# Patient Record
Sex: Female | Born: 1950 | Hispanic: No | Marital: Single | Smoking: Never smoker
Health system: Southern US, Community
[De-identification: ages and names within clinical notes are randomized; demographics above are authoritative.]

## PROBLEM LIST (undated history)

## (undated) DIAGNOSIS — T4145XA Adverse effect of unspecified anesthetic, initial encounter: Secondary | ICD-10-CM

## (undated) DIAGNOSIS — K219 Gastro-esophageal reflux disease without esophagitis: Secondary | ICD-10-CM

## (undated) DIAGNOSIS — E559 Vitamin D deficiency, unspecified: Secondary | ICD-10-CM

## (undated) DIAGNOSIS — T8859XA Other complications of anesthesia, initial encounter: Secondary | ICD-10-CM

## (undated) DIAGNOSIS — D131 Benign neoplasm of stomach: Secondary | ICD-10-CM

## (undated) DIAGNOSIS — M9080 Osteopathy in diseases classified elsewhere, unspecified site: Secondary | ICD-10-CM

## (undated) DIAGNOSIS — E889 Metabolic disorder, unspecified: Secondary | ICD-10-CM

## (undated) DIAGNOSIS — D509 Iron deficiency anemia, unspecified: Secondary | ICD-10-CM

## (undated) DIAGNOSIS — D126 Benign neoplasm of colon, unspecified: Secondary | ICD-10-CM

## (undated) DIAGNOSIS — D500 Iron deficiency anemia secondary to blood loss (chronic): Secondary | ICD-10-CM

## (undated) DIAGNOSIS — K257 Chronic gastric ulcer without hemorrhage or perforation: Secondary | ICD-10-CM

## (undated) DIAGNOSIS — S82122A Displaced fracture of lateral condyle of left tibia, initial encounter for closed fracture: Secondary | ICD-10-CM

## (undated) DIAGNOSIS — K449 Diaphragmatic hernia without obstruction or gangrene: Secondary | ICD-10-CM

## (undated) HISTORY — PX: WISDOM TOOTH EXTRACTION: SHX21

## (undated) HISTORY — DX: Diaphragmatic hernia without obstruction or gangrene: K44.9

## (undated) HISTORY — PX: COLONOSCOPY: SHX174

## (undated) HISTORY — DX: Displaced fracture of lateral condyle of left tibia, initial encounter for closed fracture: S82.122A

## (undated) HISTORY — DX: Benign neoplasm of colon, unspecified: D12.6

## (undated) HISTORY — DX: Iron deficiency anemia secondary to blood loss (chronic): D50.0

## (undated) HISTORY — DX: Benign neoplasm of stomach: D13.1

## (undated) HISTORY — DX: Chronic gastric ulcer without hemorrhage or perforation: K25.7

## (undated) HISTORY — DX: Iron deficiency anemia, unspecified: D50.9

---

## 1978-04-19 HISTORY — PX: DILATION AND CURETTAGE OF UTERUS: SHX78

## 1998-01-08 ENCOUNTER — Other Ambulatory Visit: Admission: RE | Admit: 1998-01-08 | Discharge: 1998-01-08 | Payer: Self-pay | Admitting: Obstetrics and Gynecology

## 1998-02-07 ENCOUNTER — Other Ambulatory Visit: Admission: RE | Admit: 1998-02-07 | Discharge: 1998-02-07 | Payer: Self-pay | Admitting: Obstetrics and Gynecology

## 1999-01-12 ENCOUNTER — Other Ambulatory Visit: Admission: RE | Admit: 1999-01-12 | Discharge: 1999-01-12 | Payer: Self-pay | Admitting: Obstetrics and Gynecology

## 2000-02-08 ENCOUNTER — Other Ambulatory Visit: Admission: RE | Admit: 2000-02-08 | Discharge: 2000-02-08 | Payer: Self-pay | Admitting: Obstetrics and Gynecology

## 2002-01-15 ENCOUNTER — Encounter: Payer: Self-pay | Admitting: Emergency Medicine

## 2002-01-15 ENCOUNTER — Emergency Department (HOSPITAL_COMMUNITY): Admission: EM | Admit: 2002-01-15 | Discharge: 2002-01-15 | Payer: Self-pay | Admitting: Emergency Medicine

## 2009-05-29 ENCOUNTER — Encounter (INDEPENDENT_AMBULATORY_CARE_PROVIDER_SITE_OTHER): Payer: Self-pay | Admitting: *Deleted

## 2009-10-29 ENCOUNTER — Encounter: Admission: RE | Admit: 2009-10-29 | Discharge: 2009-10-29 | Payer: Self-pay | Admitting: Internal Medicine

## 2010-05-19 NOTE — Letter (Signed)
Summary: New Patient letter  [facility]  [address] [place], [place] [address]   Phone: [phone]  Fax: [phone]       05/29/2009 MRN: [id number]  Angel Irwin [address] [place], [place]  [address]  Dear Ms. Irwin,  Welcome to the Gastroenterology Division at [facility].    You are scheduled to see Dr. Sheryn Bison on June 19, 2009 at 8:30am on the 3rd floor at [facility], [address]  We ask that you try to arrive at our office 15 minutes prior to your appointment time to allow for check-in.  We would like you to complete the enclosed self-administered evaluation form prior to your visit and bring it with you on the day of your appointment.  We will review it with you.  Also, please bring a complete list of all your medications or, if you prefer, bring the medication bottles and we will list them.  Please bring your insurance card so that we may make a copy of it.  If your insurance requires a referral to see a specialist, please bring your referral form from your primary care physician.  Co-payments are due at the time of your visit and may be paid by cash, check or credit card.     Your office visit will consist of a consult with your physician (includes a physical exam), any laboratory testing he/she may order, scheduling of any necessary diagnostic testing (e.g. x-ray, ultrasound, CT-scan), and scheduling of a procedure (e.g. Endoscopy, Colonoscopy) if required.  Please allow enough time on your schedule to allow for any/all of these possibilities.    If you cannot keep your appointment, please call [phone] to cancel or reschedule prior to your appointment date.  This allows [place] the opportunity to schedule an appointment for another patient in need of care.  If you do not cancel or reschedule by 5 p.m. the business day prior to your appointment date, you will be charged a $50.00 late cancellation/no-show fee.      Thank you for choosing [facility] for your medical needs.  We appreciate the opportunity to care for you.  Please visit [place] at our website  to learn more about our practice.                     Sincerely,                                                             The Gastroenterology Division

## 2011-01-26 ENCOUNTER — Other Ambulatory Visit: Payer: Self-pay | Admitting: Obstetrics and Gynecology

## 2011-02-12 NOTE — Patient Instructions (Addendum)
   Your procedure is scheduled on: Monday November 5th  Enter through the [employer] [facility] at: 8am Pick up the phone at the desk and dial [phone] and inform us of your arrival.  Please call this number if you have any problems the morning of surgery: [phone]  Remember: Do not eat food after midnight:Sunday Do not drink clear liquids after:midnight Sunday Take these medicines the morning of surgery with a SIP OF WATER:none  Do not wear jewelry, make-up, or FINGER nail polish Do not wear lotions, powders, or perfumes.  You may not wear deodorant. Do not shave 48 hours prior to surgery. Do not bring valuables to the hospital.  Leave suitcase in the car. After Surgery it may be brought to your room. For patients being admitted to the hospital, checkout time is 11:00am the day of discharge.   Remember to use your hibiclens as instructed.Please shower with 1/2 bottle the evening before your surgery and the other 1/2 bottle the morning of surgery.

## 2011-02-16 ENCOUNTER — Other Ambulatory Visit: Payer: Self-pay

## 2011-02-16 ENCOUNTER — Encounter (HOSPITAL_COMMUNITY)
Admission: RE | Admit: 2011-02-16 | Discharge: 2011-02-16 | Disposition: A | Discharge status: Home / Self Care | Payer: BC Managed Care – PPO | Source: Ambulatory Visit | Attending: Obstetrics and Gynecology | Admitting: Obstetrics and Gynecology

## 2011-02-16 ENCOUNTER — Encounter (HOSPITAL_COMMUNITY): Payer: Self-pay

## 2011-02-16 HISTORY — DX: Gastro-esophageal reflux disease without esophagitis: K21.9

## 2011-02-16 HISTORY — DX: Other complications of anesthesia, initial encounter: T88.59XA

## 2011-02-16 HISTORY — DX: Adverse effect of unspecified anesthetic, initial encounter: T41.45XA

## 2011-02-16 LAB — SURGICAL PCR SCREEN
MRSA, PCR: NEGATIVE
Staphylococcus aureus: NEGATIVE

## 2011-02-16 LAB — CBC
MCH: 29.6 pg (ref 26.0–34.0)
MCHC: 32.4 g/dL (ref 30.0–36.0)
Platelets: 258 10*3/uL (ref 150–400)

## 2011-02-16 NOTE — H&P (Addendum)
NAMEVIVION, Angel Irwin NO.:  [id number]  MEDICAL RECORD NO.:  [id number]  LOCATION:  PERIO                         FACILITY:  [facility]  PHYSICIAN:  Osborn Coho, M.D.   DATE OF BIRTH:  02-09-1951  DATE OF ADMISSION:  01/25/2011 DATE OF DISCHARGE:                             HISTORY & PHYSICAL   HISTORY OF PRESENT ILLNESS:  Angel Irwin is a 60 year old married white female, para 4-0-1-4, presenting for a vaginal hysterectomy with anterior-posterior colporrhaphy because of symptomatic uterine prolapse, cystocele, and rectocele.  The patient states that for the past 5 years, she has known that her uterus had "dropped," however, had very little symptoms from that occurrence.  Over the past year, however, the patient has lost approximately 30 pounds, and at that time, began to feel significant pelvic pressure, the sensation of sitting on a ball when she is seated and vaginal irritation.  She denies any changes in her bowel habits, back pain, or urinary incontinence.  Pelvic ultrasound in October 2012 showed a uterus measuring 8.55 cm x 6.57 cm x 5.30 cm with 4 measurable uterine fibroids: an anterior intramural measuring 2.11 x 2.15 x 1.49 cm, right anterior intramural measuring 0.85 x 2.02 x 1.47 cm, a posterior subserosal measuring 2.58 x 2.53 x 1.89 cm, and a right lateral subserosal fibroid measuring 4.02 x 4.09 x 3.70 cm.  Both of the patient's ovaries appeared normal on that study.  A review of both surgical and medical management options (pessary) were given to the patient, however, she has decided to proceed with definitive therapy in the form of hysterectomy with anterior-posterior colporrhaphy.  PAST MEDICAL HISTORY:  OB History:  Gravida 5, para 4-0-1-4.  The patient had a spontaneous vaginal birth in 99, 38, 53, and 83. Her largest baby weighed 9 pounds 10 ounces.  GYN History:  Menarche 60 year old.  She is menopausal.  She denies any history of  sexually transmitted diseases or abnormal Pap smears.  Her last normal Pap smear was September 2012.  Medical History:  Asthma, anemia, eczema, and bleeding stomach lesions.  SURGICAL HISTORY:  In 5 D and C.  She denies any history of blood transfusions and also refuses blood products due to being a Jehovah's Witness.  Denies any problems with anesthesia.  FAMILY HISTORY:  Cardiovascular disease, lung cancer, and diabetes mellitus.  SOCIAL HISTORY:  The patient works in Clinical biochemist at [employer]. Habits:  She does not use tobacco or illicit drugs and rarely consumes alcohol.  CURRENT MEDICATIONS:  None.  She has no known drug allergies.  The patient does have a sensitivity to Latex and fragrances,  but denies any sensitivities to peanuts, shellfish, or soy.    The patient wears glasses and contact lenses.  Denies any chest pain, shortness of breath, headache, vision changes, difficulty swallowing, nasal congestion, nausea, vomiting, diarrhea, joint swelling, myalgias, arthralgias, dysuria, hematuria, urinary frequency or urgency, hematochezia or bright red blood per rectum.  Except as is mentioned in the patient's history of present illness, the patient's review of systems is otherwise negative.  PHYSICAL EXAMINATION:  VITAL SIGNS:  Blood pressure 112/70, pulse is 70, respirations 14, temperature 98.1 degrees Fahrenheit orally, weight is 161 pounds,  height 5 feet, 1 inch tall. NECK:  Supple without masses.  There is no thyromegaly or cervical adenopathy. HEART:  Regular rate and rhythm. LUNGS:  Clear. BACK:  No CVA tenderness. ABDOMEN:  No tenderness, guarding, rebound, or organomegaly.  There are no palpable masses.  EXTREMITIES:  No clubbing, cyanosis, or edema. PELVIC:  EGBUS is atrophic.  Vagina is also atrophic with a 4/4 cystocele and rectocele.  Cervix is nontender without lesions.  Uterus is normal size, shape, and consistency without tenderness.  However,  it is prolapsed to the vaginal opening.  Adnexa without tenderness or masses.  IMPRESSION: 1. Symptomatic uterine prolapse. 2. Cystocele 3. Rectocele.  DISPOSITION:  A discussion was held with the patient regarding indications for her procedures along with their risks, which include, but are not limited to: reaction to anesthesia, damage to adjacent organs, infection, and excessive bleeding.  The patient verbalized understanding of these risks and has consented to proceed with a total vaginal hysterectomy with anterior-posterior colporrhaphy, followed by cystoscopy at [facility] on February 22, 2011 at 9:30 a.m.     Valdis Bevill J. Lowell Guitar, P.A.-C   ______________________________ Osborn Coho, M.D.    EJP/MEDQ  D:  02/15/2011  T:  02/16/2011  Job:  [id number]  No change in H&P except patient would like ovaries and fallopian tubes removed if easy to remove. Patient also reiterates that she does not want any blood products in a life threatening situation.

## 2011-02-16 NOTE — Pre-Procedure Instructions (Signed)
Pt is Jehovah Witness-refuses blood products-consent for refusal signed

## 2011-02-21 MED ORDER — DEXTROSE 5 % IV SOLN
1.0000 g | INTRAVENOUS | Status: AC
  Administered 2011-02-22: 1 g via INTRAVENOUS
  Filled 2011-02-21: qty 1

## 2011-02-22 ENCOUNTER — Encounter (HOSPITAL_COMMUNITY)
Admission: RE | Disposition: A | Discharge status: Home / Self Care | Payer: Self-pay | Source: Ambulatory Visit | Attending: Obstetrics and Gynecology

## 2011-02-22 ENCOUNTER — Other Ambulatory Visit: Payer: Self-pay | Admitting: Obstetrics and Gynecology

## 2011-02-22 ENCOUNTER — Encounter (HOSPITAL_COMMUNITY): Payer: Self-pay | Admitting: Anesthesiology

## 2011-02-22 ENCOUNTER — Other Ambulatory Visit: Payer: Self-pay

## 2011-02-22 ENCOUNTER — Ambulatory Visit (HOSPITAL_COMMUNITY): Payer: BC Managed Care – PPO | Admitting: Anesthesiology

## 2011-02-22 ENCOUNTER — Encounter (HOSPITAL_COMMUNITY): Payer: Self-pay | Admitting: *Deleted

## 2011-02-22 ENCOUNTER — Ambulatory Visit (HOSPITAL_COMMUNITY)
Admission: RE | Admit: 2011-02-22 | Discharge: 2011-02-23 | Disposition: A | Discharge status: Home / Self Care | Payer: BC Managed Care – PPO | Source: Ambulatory Visit | Attending: Obstetrics and Gynecology | Admitting: Obstetrics and Gynecology

## 2011-02-22 ENCOUNTER — Ambulatory Visit: Admit: 2011-02-22 | Payer: Self-pay | Admitting: Obstetrics and Gynecology

## 2011-02-22 DIAGNOSIS — D251 Intramural leiomyoma of uterus: Secondary | ICD-10-CM | POA: Insufficient documentation

## 2011-02-22 DIAGNOSIS — N812 Incomplete uterovaginal prolapse: Secondary | ICD-10-CM | POA: Insufficient documentation

## 2011-02-22 DIAGNOSIS — Z01818 Encounter for other preprocedural examination: Secondary | ICD-10-CM | POA: Insufficient documentation

## 2011-02-22 DIAGNOSIS — D252 Subserosal leiomyoma of uterus: Secondary | ICD-10-CM | POA: Insufficient documentation

## 2011-02-22 DIAGNOSIS — N841 Polyp of cervix uteri: Secondary | ICD-10-CM | POA: Insufficient documentation

## 2011-02-22 DIAGNOSIS — N840 Polyp of corpus uteri: Secondary | ICD-10-CM | POA: Insufficient documentation

## 2011-02-22 DIAGNOSIS — Z01812 Encounter for preprocedural laboratory examination: Secondary | ICD-10-CM | POA: Insufficient documentation

## 2011-02-22 HISTORY — PX: VAGINAL HYSTERECTOMY: SHX2639

## 2011-02-22 HISTORY — PX: SALPINGOOPHORECTOMY: SHX82

## 2011-02-22 HISTORY — PX: CYSTOSCOPY: SHX5120

## 2011-02-22 HISTORY — PX: ANTERIOR AND POSTERIOR REPAIR: SHX5121

## 2011-02-22 LAB — PRO B NATRIURETIC PEPTIDE: Pro B Natriuretic peptide (BNP): 216.8 pg/mL — ABNORMAL HIGH (ref 0–125)

## 2011-02-22 SURGERY — HYSTERECTOMY, VAGINAL
Anesthesia: General | Site: Vagina | Wound class: Clean Contaminated

## 2011-02-22 SURGERY — HYSTERECTOMY, VAGINAL
Anesthesia: General

## 2011-02-22 MED ORDER — ONDANSETRON HCL 4 MG/2ML IJ SOLN: 4.0000 mg | Freq: Four times a day (QID) | INTRAMUSCULAR | Status: DC | PRN

## 2011-02-22 MED ORDER — OXYCODONE-ACETAMINOPHEN 5-325 MG PO TABS: 1.0000 | ORAL_TABLET | ORAL | Status: DC | PRN

## 2011-02-22 MED ORDER — ALBUTEROL SULFATE HFA 108 (90 BASE) MCG/ACT IN AERS: INHALATION_SPRAY | RESPIRATORY_TRACT | Status: DC | PRN

## 2011-02-22 MED ORDER — GLYCOPYRROLATE 0.2 MG/ML IJ SOLN
INTRAMUSCULAR | Status: DC | PRN
  Administered 2011-02-22: .2 mg via INTRAVENOUS
  Administered 2011-02-22: .4 mg via INTRAVENOUS

## 2011-02-22 MED ORDER — DEXAMETHASONE SODIUM PHOSPHATE 10 MG/ML IJ SOLN
INTRAMUSCULAR | Status: AC
  Filled 2011-02-22: qty 1

## 2011-02-22 MED ORDER — SODIUM CHLORIDE 0.9 % IJ SOLN: 9.0000 mL | INTRAMUSCULAR | Status: DC | PRN

## 2011-02-22 MED ORDER — IBUPROFEN 600 MG PO TABS
600.0000 mg | ORAL_TABLET | Freq: Four times a day (QID) | ORAL | Status: DC | PRN
  Administered 2011-02-23: 600 mg via ORAL
  Filled 2011-02-22: qty 1

## 2011-02-22 MED ORDER — EPHEDRINE SULFATE 50 MG/ML IJ SOLN
INTRAMUSCULAR | Status: AC
  Filled 2011-02-22: qty 1

## 2011-02-22 MED ORDER — FENTANYL CITRATE 0.05 MG/ML IJ SOLN
INTRAMUSCULAR | Status: AC
  Administered 2011-02-22: 25 ug via INTRAVENOUS
  Filled 2011-02-22: qty 2

## 2011-02-22 MED ORDER — HYDROMORPHONE HCL PF 1 MG/ML IJ SOLN: 0.2500 mg | INTRAMUSCULAR | Status: DC | PRN

## 2011-02-22 MED ORDER — INDIGOTINDISULFONATE SODIUM 8 MG/ML IJ SOLN
INTRAMUSCULAR | Status: AC
  Filled 2011-02-22: qty 5

## 2011-02-22 MED ORDER — NEOSTIGMINE METHYLSULFATE 1 MG/ML IJ SOLN
INTRAMUSCULAR | Status: DC | PRN
  Administered 2011-02-22: 3 mg via INTRAVENOUS

## 2011-02-22 MED ORDER — KETOROLAC TROMETHAMINE 30 MG/ML IJ SOLN
INTRAMUSCULAR | Status: AC
  Filled 2011-02-22: qty 1

## 2011-02-22 MED ORDER — ALBUTEROL SULFATE HFA 108 (90 BASE) MCG/ACT IN AERS
INHALATION_SPRAY | RESPIRATORY_TRACT | Status: DC | PRN
  Administered 2011-02-22: 2 via RESPIRATORY_TRACT

## 2011-02-22 MED ORDER — ONDANSETRON HCL 4 MG/2ML IJ SOLN
INTRAMUSCULAR | Status: AC
  Filled 2011-02-22: qty 2

## 2011-02-22 MED ORDER — MIDAZOLAM HCL 5 MG/5ML IJ SOLN
INTRAMUSCULAR | Status: DC | PRN
  Administered 2011-02-22: 1.5 mg via INTRAVENOUS
  Administered 2011-02-22: .5 mg via INTRAVENOUS

## 2011-02-22 MED ORDER — DIPHENHYDRAMINE HCL 12.5 MG/5ML PO ELIX
12.5000 mg | ORAL_SOLUTION | Freq: Four times a day (QID) | ORAL | Status: DC | PRN
  Filled 2011-02-22: qty 5

## 2011-02-22 MED ORDER — LACTATED RINGERS IV SOLN
INTRAVENOUS | Status: DC
  Administered 2011-02-22 – 2011-02-23 (×2): via INTRAVENOUS

## 2011-02-22 MED ORDER — DOCUSATE SODIUM 100 MG PO CAPS
100.0000 mg | ORAL_CAPSULE | Freq: Every day | ORAL | Status: DC
  Administered 2011-02-22 – 2011-02-23 (×2): 100 mg via ORAL
  Filled 2011-02-22 (×2): qty 1

## 2011-02-22 MED ORDER — LIDOCAINE HCL (CARDIAC) 20 MG/ML IV SOLN
INTRAVENOUS | Status: DC | PRN
  Administered 2011-02-22: 60 mg via INTRAVENOUS

## 2011-02-22 MED ORDER — ROCURONIUM BROMIDE 100 MG/10ML IV SOLN
INTRAVENOUS | Status: DC | PRN
  Administered 2011-02-22: 10 mg via INTRAVENOUS
  Administered 2011-02-22: 35 mg via INTRAVENOUS

## 2011-02-22 MED ORDER — LACTATED RINGERS IV SOLN
INTRAVENOUS | Status: DC
  Administered 2011-02-22 (×2): via INTRAVENOUS

## 2011-02-22 MED ORDER — HYDROMORPHONE 0.3 MG/ML IV SOLN
INTRAVENOUS | Status: DC
  Administered 2011-02-22: 0.59 mg via INTRAVENOUS
  Administered 2011-02-22: 7.5 mg via INTRAVENOUS
  Filled 2011-02-22: qty 25

## 2011-02-22 MED ORDER — GLYCOPYRROLATE 0.2 MG/ML IJ SOLN
0.2000 mg | Freq: Once | INTRAMUSCULAR | Status: AC
  Administered 2011-02-22: 0.2 mg via INTRAVENOUS

## 2011-02-22 MED ORDER — LIDOCAINE HCL (CARDIAC) 20 MG/ML IV SOLN
INTRAVENOUS | Status: AC
  Filled 2011-02-22: qty 5

## 2011-02-22 MED ORDER — GLYCOPYRROLATE 0.2 MG/ML IJ SOLN
INTRAMUSCULAR | Status: AC
  Filled 2011-02-22: qty 1

## 2011-02-22 MED ORDER — DEXAMETHASONE SODIUM PHOSPHATE 4 MG/ML IJ SOLN
INTRAMUSCULAR | Status: DC | PRN
  Administered 2011-02-22: 10 mg via INTRAVENOUS

## 2011-02-22 MED ORDER — PROPOFOL 10 MG/ML IV EMUL
INTRAVENOUS | Status: DC | PRN
  Administered 2011-02-22: 100 mg via INTRAVENOUS

## 2011-02-22 MED ORDER — DIPHENHYDRAMINE HCL 50 MG/ML IJ SOLN: 12.5000 mg | Freq: Four times a day (QID) | INTRAMUSCULAR | Status: DC | PRN

## 2011-02-22 MED ORDER — ESTRADIOL 0.1 MG/GM VA CREA
TOPICAL_CREAM | VAGINAL | Status: DC | PRN
  Administered 2011-02-22: 1 via VAGINAL

## 2011-02-22 MED ORDER — FENTANYL CITRATE 0.05 MG/ML IJ SOLN
25.0000 ug | INTRAMUSCULAR | Status: DC | PRN
  Administered 2011-02-22 (×3): 25 ug via INTRAVENOUS

## 2011-02-22 MED ORDER — MIDAZOLAM HCL 2 MG/2ML IJ SOLN
INTRAMUSCULAR | Status: AC
  Filled 2011-02-22: qty 2

## 2011-02-22 MED ORDER — ROCURONIUM BROMIDE 50 MG/5ML IV SOLN
INTRAVENOUS | Status: AC
  Filled 2011-02-22: qty 1

## 2011-02-22 MED ORDER — FENTANYL CITRATE 0.05 MG/ML IJ SOLN
INTRAMUSCULAR | Status: AC
  Filled 2011-02-22: qty 5

## 2011-02-22 MED ORDER — PROPOFOL 10 MG/ML IV EMUL
INTRAVENOUS | Status: AC
  Filled 2011-02-22: qty 20

## 2011-02-22 MED ORDER — INDIGOTINDISULFONATE SODIUM 8 MG/ML IJ SOLN
INTRAMUSCULAR | Status: DC | PRN
  Administered 2011-02-22: 40 mg via INTRAVENOUS

## 2011-02-22 MED ORDER — VASOPRESSIN 20 UNIT/ML IJ SOLN
INTRAVENOUS | Status: DC | PRN
  Administered 2011-02-22: 10:00:00 via INTRAMUSCULAR

## 2011-02-22 MED ORDER — FENTANYL CITRATE 0.05 MG/ML IJ SOLN
INTRAMUSCULAR | Status: DC | PRN
  Administered 2011-02-22: 100 ug via INTRAVENOUS
  Administered 2011-02-22 (×3): 50 ug via INTRAVENOUS

## 2011-02-22 MED ORDER — EPHEDRINE SULFATE 50 MG/ML IJ SOLN
INTRAMUSCULAR | Status: DC | PRN
  Administered 2011-02-22: 10 mg via INTRAVENOUS

## 2011-02-22 MED ORDER — ONDANSETRON HCL 4 MG/2ML IJ SOLN
INTRAMUSCULAR | Status: DC | PRN
  Administered 2011-02-22: 4 mg via INTRAVENOUS

## 2011-02-22 MED ORDER — GLYCOPYRROLATE 0.2 MG/ML IJ SOLN
INTRAMUSCULAR | Status: AC
  Administered 2011-02-22: 0.2 mg via INTRAVENOUS
  Filled 2011-02-22: qty 1

## 2011-02-22 MED ORDER — NALOXONE HCL 0.4 MG/ML IJ SOLN: 0.4000 mg | INTRAMUSCULAR | Status: DC | PRN

## 2011-02-22 MED ORDER — MENTHOL 3 MG MT LOZG: 1.0000 | LOZENGE | OROMUCOSAL | Status: DC | PRN

## 2011-02-22 SURGICAL SUPPLY — 42 items
CANISTER SUCTION 2500CC (MISCELLANEOUS) ×4 IMPLANT
CLOTH BEACON ORANGE TIMEOUT ST (SAFETY) ×4 IMPLANT
CONT PATH 16OZ SNAP LID 3702 (MISCELLANEOUS) IMPLANT
DECANTER SPIKE VIAL GLASS SM (MISCELLANEOUS) IMPLANT
DRAPE CAMERA CLOSED 9X96 (DRAPES) IMPLANT
DRAPE PROXIMA HALF (DRAPES) ×4 IMPLANT
DRAPE STERI URO 9X17 APER PCH (DRAPES) ×4 IMPLANT
DRAPE UTILITY XL STRL (DRAPES) ×4 IMPLANT
GAUZE VAGINAL PACKING 31 073 (GAUZE/BANDAGES/DRESSINGS) ×1 IMPLANT
GLOVE BIO SURGEON STRL SZ 6.5 (GLOVE) ×8 IMPLANT
GLOVE BIO SURGEON STRL SZ7.5 (GLOVE) ×8 IMPLANT
GLOVE BIOGEL PI IND STRL 6.5 (GLOVE) ×3 IMPLANT
GLOVE BIOGEL PI IND STRL 7.5 (GLOVE) ×3 IMPLANT
GLOVE BIOGEL PI INDICATOR 6.5 (GLOVE) ×1
GLOVE BIOGEL PI INDICATOR 7.5 (GLOVE) ×1
GOWN PREVENTION PLUS LG XLONG (DISPOSABLE) ×12 IMPLANT
NDL SPNL 22GX3.5 QUINCKE BK (NEEDLE) IMPLANT
NEEDLE HYPO 22GX1.5 SAFETY (NEEDLE) IMPLANT
NEEDLE MAYO .5 CIRCLE (NEEDLE) IMPLANT
NEEDLE SPNL 22GX3.5 QUINCKE BK (NEEDLE) IMPLANT
NS IRRIG 1000ML POUR BTL (IV SOLUTION) ×4 IMPLANT
PACK VAGINAL WOMENS (CUSTOM PROCEDURE TRAY) ×4 IMPLANT
SET CYSTO W/LG BORE CLAMP LF (SET/KITS/TRAYS/PACK) IMPLANT
SUT CHROMIC 0 CT 1 (SUTURE) IMPLANT
SUT CHROMIC 3 0 SH 27 (SUTURE) ×1 IMPLANT
SUT VIC AB 0 CT1 18XCR BRD8 (SUTURE) ×9 IMPLANT
SUT VIC AB 0 CT1 27 (SUTURE)
SUT VIC AB 0 CT1 27XBRD ANBCTR (SUTURE) IMPLANT
SUT VIC AB 0 CT1 8-18 (SUTURE) ×12
SUT VIC AB 1 CT1 36 (SUTURE) ×1 IMPLANT
SUT VIC AB 2-0 CT1 (SUTURE) IMPLANT
SUT VIC AB 2-0 CT1 27 (SUTURE) ×8
SUT VIC AB 2-0 CT1 TAPERPNT 27 (SUTURE) IMPLANT
SUT VIC AB 2-0 SH 27 (SUTURE) ×4
SUT VIC AB 2-0 SH 27XBRD (SUTURE) ×3 IMPLANT
SUT VIC AB 3-0 SH 27 (SUTURE) ×24
SUT VIC AB 3-0 SH 27X BRD (SUTURE) ×18 IMPLANT
SUT VICRYL 0 TIES 12 18 (SUTURE) ×4 IMPLANT
SYR TB 1ML 25GX5/8 (SYRINGE) ×4 IMPLANT
TOWEL OR 17X24 6PK STRL BLUE (TOWEL DISPOSABLE) ×8 IMPLANT
TRAY FOLEY CATH 14FR (SET/KITS/TRAYS/PACK) ×4 IMPLANT
WATER STERILE IRR 1000ML POUR (IV SOLUTION) ×4 IMPLANT

## 2011-02-22 NOTE — Progress Notes (Signed)
eLink Physician-Brief Progress Note Patient Name: Angel Irwin DOB: [birth year] MRN: [id number]  Date of Service  02/22/2011   HPI/Events of Note   Dr Su Hilt says this patient has no CAD risk factors (no smoking, no bp, no dm, no lipid, no family hx). Post op EKG showed changes so she sent patient to tele and calling for help  eICU Interventions  EKG shos bigeminny occassona annd PVC occ. Sinus arrhtymia. ALso in one ekg T wave inversion lateral leads. Will do ck.troponin q8h x 24h. She will fu. Refer cards as opd if enzymes negative  D/w DR Daphene Jaeger 02/22/2011, 8:45 PM

## 2011-02-22 NOTE — Transfer of Care (Signed)
Immediate Anesthesia Transfer of Care Note  Patient: Angel Irwin  Procedure(s) Performed:  HYSTERECTOMY VAGINAL; ANTERIOR (CYSTOCELE) AND POSTERIOR REPAIR (RECTOCELE); CYSTOSCOPY; SALPINGO OOPHERECTOMY  Patient Location: PACU  Anesthesia Type: General  Level of Consciousness: sedated, patient cooperative and lethargic  Airway & Oxygen Therapy: Patient Spontanous Breathing, Patient connected to nasal cannula oxygen and with oral airway  Post-op Assessment: Report given to PACU RN and Patient moving all extremities  Post vital signs: Reviewed  Complications: No apparent anesthesia complications

## 2011-02-22 NOTE — Progress Notes (Signed)
Day of Surgery Procedure(s): HYSTERECTOMY VAGINAL ANTERIOR (CYSTOCELE) AND POSTERIOR REPAIR (RECTOCELE) CYSTOSCOPY SALPINGO OOPHERECTOMY  Subjective: Patient reports no complaints.    Objective: I have reviewed patient's vital signs. 300cc/2 1/2 hrs  General: alert and no distress Resp: clear to auscultation bilaterally Cardio: regular rate and rhythm, S1, S2 normal, no murmur, click, rub or gallop with skipped beats GI: soft, non-tender; bowel sounds decreased; no masses,  no organomegaly Extremities: extremities normal, atraumatic, no cyanosis or edema and Homans sign is negative, no sign of DVT. SCDs are on. Vaginal Bleeding: vaginal packing in place Assessment: s/p Procedure(s): HYSTERECTOMY VAGINAL ANTERIOR (CYSTOCELE) AND POSTERIOR REPAIR (RECTOCELE) CYSTOSCOPY SALPINGO OOPHERECTOMY: stable and progressing well  Plan: Advance diet as tolerated Encourage ambulation Recovering appropriately 12lead EKG secondary skipped beats although regular overall  LOS: 0 days    Angel Irwin Y 02/22/2011, 6:45 PM

## 2011-02-22 NOTE — Anesthesia Preprocedure Evaluation (Addendum)
Anesthesia Evaluation    Airway       Dental   Pulmonary asthma (rare inhaler, mostly sesasonal. Will use x3 before induction) ,          Cardiovascular     Neuro/Psych    GI/Hepatic   Endo/Other    Renal/GU      Musculoskeletal   Abdominal   Peds  Hematology   Anesthesia Other Findings   Reproductive/Obstetrics                           Anesthesia Physical Anesthesia Plan  ASA: II  Anesthesia Plan: General   Post-op Pain Management:    Induction: Intravenous  Airway Management Planned: Oral ETT  Additional Equipment:   Intra-op Plan:   Post-operative Plan:   Informed Consent: I have reviewed the patients History and Physical, chart, labs and discussed the procedure including the risks, benefits and alternatives for the proposed anesthesia with the patient or authorized representative who has indicated his/her understanding and acceptance.   Dental Advisory Given  Plan Discussed with: CRNA and Surgeon  Anesthesia Plan Comments: (Patient refuses blood products. She was very firm about this subject.  Discussed  general anesthesia, including possible nausea, instrumentation of airway, sore throat,pulmonary aspiration, etc. I asked if the were any outstanding questions, or  concerns before we proceeded. )        Anesthesia Quick Evaluation

## 2011-02-22 NOTE — Anesthesia Postprocedure Evaluation (Signed)
  Anesthesia Post-op Note  Patient: Angel Irwin  Procedure(s) Performed:  HYSTERECTOMY VAGINAL; ANTERIOR (CYSTOCELE) AND POSTERIOR REPAIR (RECTOCELE); CYSTOSCOPY; SALPINGO OOPHERECTOMY  Patient Location: PACU  Anesthesia Type: General  Level of Consciousness: awake, alert  and oriented  Airway and Oxygen Therapy: Patient Spontanous Breathing  Post-op Pain: none  Post-op Assessment: Post-op Vital signs reviewed, Patient's Cardiovascular Status Stable, Respiratory Function Stable, Patent Airway, No signs of Nausea or vomiting and Pain level controlled. Has trigeminy in PACU.  Post-op Vital Signs: Reviewed and stable  Complications: No apparent anesthesia complications

## 2011-02-22 NOTE — Addendum Note (Signed)
Addendum  created 02/22/11 1646 by Cephus Shelling   Modules edited:Notes Section

## 2011-02-22 NOTE — Anesthesia Postprocedure Evaluation (Signed)
Anesthesia Post Note  Patient: Angel Irwin  Procedure(s) Performed:  HYSTERECTOMY VAGINAL; ANTERIOR (CYSTOCELE) AND POSTERIOR REPAIR (RECTOCELE); CYSTOSCOPY; SALPINGO OOPHERECTOMY  Anesthesia type: General  Patient location: Women's Unit  Post pain: Pain level controlled  Post assessment: Post-op Vital signs reviewed  Last Vitals:  Filed Vitals:   02/22/11 1551  BP: 106/66  Pulse: 79  Temp: 36.4 C  Resp: 18    Post vital signs: Reviewed and stable  Level of consciousness: sedated  Complications: No apparent anesthesia complications

## 2011-02-22 NOTE — Op Note (Signed)
Preop Diagnosis: Uterine Prolapse,Cystocele, Rectocele   Postop Diagnosis: Uterine Prolapse,Cystocele,Rectocele   Procedure: HYSTERECTOMY VAGINAL ANTERIOR (CYSTOCELE) AND POSTERIOR REPAIR (RECTOCELE) CYSTOSCOPY   Anesthesia: General   Anesthesiologist: Dr. Cristela Blue  Attending: Purcell Nails, MD   Assistant: Henreitta Leber, PA  Findings: nl appearing bilateral ovaries and tubes.  Pathology: uterus, cervix, ovaries and tubes  Fluids: 3100cc  UOP: 400cc  EBL: 100cc  Complications: None  Procedure: The patient was taken to the operating room after the risks, benefits and alternatives were discussed with the patient. The patient verbalized understanding and consent signed and witnessed. The patient was placed under general anesthesia per the anesthesiologist after a timeout was performed per protocol. The patient was prepped and draped in the normal sterile fashion in the dorsal lithotomy position. A weighted speculum was placed the patient's vagina and the anterior lip of the cervix was grasped single-tooth tenaculum. Dever retractors were placed for vaginal wall retraction. The cervix was circumscribed with the bovie after injecting the cervix with pitressin at a concentration of 20 units of Pitressin in 100 cc of normal saline.  A total of 20 cc was injected.  Once the cervix was circumscribed the posterior cul-de-sac was entered without difficulty with the mayo scissors.  Attention was then turned to the anterior cul-de-sac which was entered without difficulty as well.  Heaney clamps were used to clamp the uterosacral and cardinal ligaments and the tissue was then cut and suture ligated using 0 Vicryl. This was done bilaterally. The remaining parametrial tissue was clamped, cut and suture ligated using 0 Vicryl in a sequential and bilateral fashion as well up to the utero-ovarian ligaments. The fundus was exteriorized and the utero-ovarian pedicle on the patient's right was clamped  with a Kelly cut and ligated with 0 Vicryl and suture ligated as well with 0 Vicryl. The same was done on the contralateral side. The uterus was removed and handed off to be sent to pathology. The left ovary was identified and grasped with the Babcock as well as the fallopian tube on the ipsilateral side. The infundibulopelvic was clamped with a Kelly clamp and the ovary and fallopian tube were excised and the remaining pedicle was ligated with 0 Vicryl and suture-ligated with 0 Vicryl as well. The same was done on the contralateral side. The bilateral ovaries and fallopian tubes appeared to be within normal limits. The angles of the cuff were sutured using the free needle and the already existent suture on the uterosacral ligament. This was done bilaterally. A McCall culdoplasty stitch was placed using 0 vicryl. The bilateral pedicles at the infundibulopelvic ligaments pedicles were noted to be hemostatic. The cuff was then repaired to the midline with figure-of-eight stitches of 0 Vicryl.  Dilute pitressin was then injected in the anterior vaginal wall which was then incised and the underlying tissue dissected away the anterior vaginal mucosa. The cystocele was repaired with Tresa Endo plication stitches as well as a pursestring stitch all using 3-0 Vicryl. The anterior vaginal wall was repaired with 2-0 Vicryl via interrupted stitches. Attention was then turned to the posterior vaginal wall where dilute Pitressin was injected. The posterior vaginal wall was incised and the underlying tissue dissected away and rectocele repaired with 3-0 Vicryl via Kelly plication stitches. The posterior vaginal mucosa was repaired with 2-0 Vicryl via a running and intermittently interlocking stitch. The perineal body was reinforced with 2-0 vicryl.  The skin was reapproximated using 3-0 Monocryl via subcuticular stitch. The Foley was removed and cystoscopy  was performed after administration of indigo carmine. Bilateral ureters were  noted to eflux without difficulty. The Foley was replaced to gravity. The vagina was packed with 2 inch plain packing soaked with estrogen cream. The patient tolerated procedure well and was returned to the recovery room in good condition.

## 2011-02-23 ENCOUNTER — Encounter (HOSPITAL_COMMUNITY): Payer: Self-pay | Admitting: Obstetrics and Gynecology

## 2011-02-23 LAB — CARDIAC PANEL(CRET KIN+CKTOT+MB+TROPI)
CK, MB: 2.2 ng/mL (ref 0.3–4.0)
Relative Index: INVALID (ref 0.0–2.5)
Relative Index: INVALID (ref 0.0–2.5)
Total CK: 65 U/L (ref 7–177)
Troponin I: <0.3 ng/mL (ref <0.30)

## 2011-02-23 MED ORDER — IBUPROFEN 600 MG PO TABS: 600.0000 mg | ORAL_TABLET | Freq: Four times a day (QID) | ORAL | Status: AC | PRN

## 2011-02-23 MED ORDER — ESTROGENS, CONJUGATED 0.625 MG/GM VA CREA: TOPICAL_CREAM | Freq: Every day | VAGINAL | Status: AC

## 2011-02-23 NOTE — Progress Notes (Signed)
Angel Irwin is a60 y.o.  [id number]  Post Op Date # 1  Subjective: Patient is Doing well postoperatively. Episode of arrhythmia being evaluated with cardiac enzymes and EKG. Patient has Pain is controlled with current analgesics and heating pad. Tolerating liquids, + flatus, mild  Lightheadedness when sitting on bedside. Denies palpitations, chest pain, shortness of breath or discomfort in arms or jaw.  Objective: Vital signs in last 24 hours: Temp:  [96.9 F (36.1 C)-97.9 F (36.6 C)] 97.7 F (36.5 C) (11/06 0400) Pulse Rate:  [55-99] 69  (11/06 0600) Resp:  [6-36] 11  (11/06 0600) BP: (82-120)/(36-75) 83/45 mmHg (11/06 0600) SpO2:  [96 %-100 %] 99 % (11/06 0600) Weight:  [72.576 kg (160 lb)] 160 lb (72.576 kg) (11/05 1703)  Intake/Output from previous day: 11/05 0701 - 11/06 0700 In: 5401.8 [P.O.:440; I.V.:4961.8] Out: 2900 [Urine:2800] Intake/Output this shift:    Lab 02/16/11 0921  WBC 6.2  HGB 13.9  HCT 42.9  PLT 258    No results found for this basename: NA:3,K:3,CL:3,CO2:3,BUN:3,CREATININE:3,CALCIUM:3,LABALBU:3,PROT:3,BILITOT:3,ALKPHOS:3,ALT:3,AST:3,GLUCOSE:3 in the last 168 hours  EXAM: Resp: clear to auscultation bilaterally Cardio: regular rate and rhythm, S1, S2 normal, no murmur, click, rub or gallop GI: soft, appropriately tender, bowel sound present. Extremities: Homans sign is negative, no sign of DVT and SCD hose in place and functioning. Vaginal Bleeding: faint stain on perineal pad.  Vaginal packing removed with miminal blood stain throughout.  CK, CK-MB & Troponin I - negative  BNP- elevated  Assessment: s/p Procedure(s): HYSTERECTOMY VAGINAL ANTERIOR (CYSTOCELE) AND POSTERIOR REPAIR (RECTOCELE) CYSTOSCOPY SALPINGO OOPHERECTOMY: stable Arrhythmia Plan: Advance diet  Encourage ambulation  Per Dr. Marchelle Gearing, complete cardiac enzyme panel every 8 hours x 3 and if negative will have patient follow up with a cardiologist as an  outpatient.  Routine Care  LOS: 1 day    Pharrell Ledford, PA-C 02/23/2011 7:20 AM

## 2011-02-23 NOTE — Progress Notes (Signed)
UR chart review completed.  

## 2011-02-23 NOTE — Discharge Summary (Signed)
Physician Discharge Summary  Patient ID: Angel Irwin MRN: [id number] DOB/AGE: Aug 24, 1950 60 y.o.  Admit date: 02/22/2011 Discharge date: 02/23/2011  Admission Diagnoses: prolapse and pelvic relaxation  Discharge Diagnoses: s/p hysterectomy and anterior and posterior repair.  arrythmia Active Problems:  * No active hospital problems. *    Discharged Condition: good  Hospital Course: s/p surgery doing well.  Arrythmia postop with negative cardiac enzymes recommend cardiology consult as outpatient.  Pt observed on telemetry and did well and had no clinical symptoms.  Consults: Critical Care consult while pt on telemetry with above recs.  Significant Diagnostic Studies: labs: nl postop cbc  Treatments: surgery: hysterectomy and A-P repair and observation  Discharge Exam: Blood pressure 99/50, pulse 93, temperature 98.3 F (36.8 C), temperature source Oral, resp. rate 19, height 5\' 1"  (1.549 m), weight 72.576 kg (160 lb), SpO2 97.00%. General appearance: alert Resp: clear to auscultation bilaterally Cardio: regularly irregular rhythm (skipped beats ie PVCs) GI: soft, non-tender; bowel sounds normal; no masses,  no organomegaly Pelvic: minimal vaginal bleeding Extremities: extremities normal, atraumatic, no cyanosis or edema and Homans sign is negative, no sign of DVT  Disposition: doing well and discharge to home with f/u in 2wks.  outpt cardiology consult.   Current Discharge Medication List    START taking these medications   Details  conjugated estrogens (PREMARIN) vaginal cream Place vaginally daily. Place 1/2 gram per vagina every day  for two weeks then three times per week for a total of 6 weeks. Qty: 42.5 g, Refills: 1    ibuprofen (ADVIL,MOTRIN) 600 MG tablet Take 1 tablet (600 mg total) by mouth every 6 (six) hours as needed for pain (mild pain). Qty: 30 tablet, Refills: 1       Follow-up Information    Follow up with Purcell Nails, MD in 2 weeks. (as  scheduled)    Contact information:   [address] [place] [address] [phone]          Signed: Purcell Nails 02/23/2011, 6:26 PM

## 2011-09-22 ENCOUNTER — Encounter: Payer: Self-pay | Admitting: Family

## 2011-09-22 ENCOUNTER — Ambulatory Visit (INDEPENDENT_AMBULATORY_CARE_PROVIDER_SITE_OTHER): Payer: BC Managed Care – PPO | Admitting: Family

## 2011-09-22 VITALS — BP 110/78 | Ht 60.0 in | Wt 165.0 lb

## 2011-09-22 DIAGNOSIS — M6530 Trigger finger, unspecified finger: Secondary | ICD-10-CM

## 2011-09-22 DIAGNOSIS — M79609 Pain in unspecified limb: Secondary | ICD-10-CM

## 2011-09-22 DIAGNOSIS — M79646 Pain in unspecified finger(s): Secondary | ICD-10-CM

## 2011-09-22 MED ORDER — PREDNISONE 20 MG PO TABS: 40.0000 mg | ORAL_TABLET | Freq: Every day | ORAL | Status: AC

## 2011-09-22 NOTE — Progress Notes (Signed)
Subjective:    Patient ID: Angel Irwin, female    DOB: [birth year], 62 y.o.   MRN: [id number]  HPI  61 year old Caucasian female, nonsmoker, is here to establish care and presents today with complaints of trigger finger that she states began in December of last year. PCP at the time gave her tramadol for pain relief and instructed her to limit repetitive movements such as her crocheting and sewing to allow the joint to rest. She stopped these activities for 3 months, but has since returned to doing them without significant worsening of her symptoms. Takes tramadol at night and Ibuprofen or Tylenol during the day with good pain relief. Rates pain as 4/10 and describes it as sore occurring intermittently throughout the day. Reports she also tried wearing a brace she purchased for a few months to immobilize the joint. Denies numbness, tingling, or decreased strength to the area. States thumb is not locking at the time of this visit.    Review of Systems  Constitutional: Negative.   HENT: Negative.   Eyes: Negative.   Respiratory: Negative.   Cardiovascular: Negative.   Gastrointestinal: Negative.   Genitourinary: Negative.   Musculoskeletal: Positive for arthralgias (to right thumb).  Skin: Negative.   Neurological: Negative.   Hematological: Negative.   Psychiatric/Behavioral: Negative.    Past Medical History  Diagnosis Date  . Complication of anesthesia     slow to awaken  . Asthma     albuterol-rescue inhaler-uses 1-2 x year  . GERD (gastroesophageal reflux disease)     no meds    History   Social History  . Marital Status: Married    Spouse Name: N/A    Number of Children: N/A  . Years of Education: N/A   Occupational History  . Not on file.   Social History Main Topics  . Smoking status: Never Smoker   . Smokeless tobacco: Not on file  . Alcohol Use: Yes  . Drug Use: No  . Sexually Active:    Other Topics Concern  . Not on file   Social History Narrative    . No narrative on file    Past Surgical History  Procedure Date  . Dilation and curettage of uterus 1980  . Colonoscopy   . Wisdom tooth extraction   . Vaginal hysterectomy 02/22/2011    Procedure: HYSTERECTOMY VAGINAL;  Surgeon: Purcell Nails, MD;  Location: [facility];  Service: Gynecology;  Laterality: N/A;  . Anterior and posterior repair 02/22/2011    Procedure: ANTERIOR (CYSTOCELE) AND POSTERIOR REPAIR (RECTOCELE);  Surgeon: Purcell Nails, MD;  Location: [facility];  Service: Gynecology;  Laterality: N/A;  . Cystoscopy 02/22/2011    Procedure: CYSTOSCOPY;  Surgeon: Purcell Nails, MD;  Location: [facility];  Service: Gynecology;  Laterality: N/A;  . Salpingoophorectomy 02/22/2011    Procedure: SALPINGO OOPHERECTOMY;  Surgeon: Purcell Nails, MD;  Location: [facility];  Service: Gynecology;  Laterality: Bilateral;    Family History  Problem Relation Age of Onset  . Diabetes Father   . Cancer Sister 90    lung  . Heart disease Maternal Grandmother   . Miscarriages / Stillbirths Maternal Grandmother   . Heart disease Maternal Grandfather     No Known Allergies  Current Outpatient Prescriptions on File Prior to Visit  Medication Sig Dispense Refill  . conjugated estrogens (PREMARIN) vaginal cream Place vaginally daily. Place 1/2 gram per vagina every day  for two weeks then three times per week for a  total of 6 weeks.  42.5 g  1    BP 110/78  Ht 5' (1.524 m)  Wt 165 lb (74.844 kg)  BMI 32.22 kg/m2chart     Objective:   Physical Exam  Constitutional: She is oriented to person, place, and time. She appears well-developed and well-nourished.  Cardiovascular: Normal rate, regular rhythm and normal heart sounds.  Exam reveals no gallop and no friction rub.   No murmur heard. Pulmonary/Chest: Effort normal and breath sounds normal. No respiratory distress. She has no wheezes. She has no rales.  Musculoskeletal:       Right hand: She exhibits decreased range of motion (to right  thumb) and swelling (around MCP joint of right thumb). normal sensation noted. Normal strength noted.  Neurological: She is alert and oriented to person, place, and time.  Skin: Skin is warm and dry.          Assessment & Plan:  Assessment: Trigger Finger, Pain  Plan: Start oral prednisone 40 mg daily. Refer to orthopedics for further evaluation and possible cortisone injection. Will follow up as needed.

## 2011-09-22 NOTE — Patient Instructions (Signed)
Trigger Finger Trigger finger (digital tendinitis and stenosing tenosynovitis) is a common disorder that causes an often painful catching of the fingers or thumb. It occurs as a clicking, snapping or locking of a finger in the palm of the hand. The reason for this is that there is a problem with the tendons which flex the fingers sliding smoothly through their sheaths. The cause of this may be inflammation of the tendon and sheath, or from a thickening or nodule in the tendon. The condition may occur in any finger or a couple fingers at the same time. The cause may be overuse while doing the same activity over and over again with your hands.  Tendons are the tough cords that connect the muscles to bones. Muscles and tendons are part of the system which allows your body to move. When muscles contract in the forearm on the palm side, they pull the tendons toward the elbow and cause the fingers and thumb to bend (flex) toward the palm. These are the flexor tendons. The tendons slide through a slippery smooth membrane (synovium) which is called the tendon sheath. The sheaths have areas of tough fibrous tissues surrounding them which hold the tendons close to the bone. These are called pulleys because they work like a pulley. The first pulley is in the palm of the hand near the crease which runs across your palm. If the area of the tendon thickening is near the pulley, the tendon cannot slide smoothly through the pulley and this causes the trigger finger. The finger may lock with the finger curled or suddenly straighten out with a snap. This is more common in patients with rheumatoid arthritis and diabetes. Left untreated, the condition may get worse to the point where the finger becomes locked in flexion, like making a fist, or less commonly locked with the finger straightened out. DIAGNOSIS  Your caregiver will easily make this diagnosis on examination. TREATMENT   Splinting for 6 to 8 weeks of time may be  helpful. Use the splints as your caregiver suggests.   Heat used for twenty minutes at least four times a day followed by ice packs for twenty minutes unless directed otherwise by your caregiver may be helpful. If you find either heat or cold seems to be making the problem worse, quit using them and ask your caregiver for directions.   Cortisone injections along with splinting may speed up recovery. Several injections may be required. Cortisone may give relief after one injection.   Only take over-the-counter or prescription medicines for pain, discomfort, or fever as directed by your caregiver.   Surgery is another treatment that may be used if conservative treatments using injection and splinting does not work. Surgery can be minor without incisions (a cut does not have to be made) and can be done with a needle through the skin. No stitches are needed and most patients may return to work the same day.   Other surgical choices involve an open procedure where the surgeon opens the hand through a small incision (cut) and cuts the pulley so the tendon can again slide smoothly. Your hand will still work fine. This small operation requires stitches and the recovery will be a little longer and the incisions will need to be protected until completely healed. You may have to limit your activities for up to 6 months.   Occupational or hand therapy may be required if there is stiffness remaining in the finger.  RISKS AND COMPLICATIONS Complications are uncommon but   some problems that may occur are:  Recurrence of the trigger finger. This does not mean that the surgery was not well done. It simply means that you may have formed scar tissue following surgery that causes the problem to reoccur.   Infection which could ruin the results of the surgery and can result in a finger which is frozen and can not move normally.   Nerve injury is possible which could result in permanent numbness of one or more fingers.   CARE AFTER SURGERY  Elevate your hand above your heart and use ice as instructed.   Follow instructions regarding finger motion/exercise.   Keep the surgical wound dry for at least 48 hrs or longer if instructed.   Keep your follow-up appointments.   Return to work and normal activities as instructed.  SEEK IMMEDIATE MEDICAL CARE IF:  Your problems are getting worse or you do not obtain relief from the treatment. Document Released: 01/24/2004 Document Revised: 03/25/2011 Document Reviewed: 09/17/2008 [employer] Patient Information 2012 [employer]. 

## 2011-09-22 NOTE — Progress Notes (Signed)
Addended byAdline Mango B on: 09/22/2011 03:30 PM   Modules accepted: Orders

## 2011-12-15 ENCOUNTER — Encounter: Payer: Self-pay | Admitting: Family

## 2011-12-15 ENCOUNTER — Ambulatory Visit (INDEPENDENT_AMBULATORY_CARE_PROVIDER_SITE_OTHER): Payer: BC Managed Care – PPO | Admitting: Family

## 2011-12-15 VITALS — BP 120/82 | Temp 98.1°F | Wt 174.0 lb

## 2011-12-15 DIAGNOSIS — Z8679 Personal history of other diseases of the circulatory system: Secondary | ICD-10-CM

## 2011-12-15 DIAGNOSIS — R635 Abnormal weight gain: Secondary | ICD-10-CM

## 2011-12-15 DIAGNOSIS — R5383 Other fatigue: Secondary | ICD-10-CM

## 2011-12-15 DIAGNOSIS — R5381 Other malaise: Secondary | ICD-10-CM

## 2011-12-15 LAB — CBC WITH DIFFERENTIAL/PLATELET
Basophils Absolute: 0.1 10*3/uL (ref 0.0–0.1)
Basophils Relative: 0.6 % (ref 0.0–3.0)
Eosinophils Absolute: 0.3 10*3/uL (ref 0.0–0.7)
Hemoglobin: 11 g/dL — ABNORMAL LOW (ref 12.0–15.0)
MCHC: 31.5 g/dL (ref 30.0–36.0)
MCV: 82.6 fl (ref 78.0–100.0)
Monocytes Absolute: 0.8 10*3/uL (ref 0.1–1.0)
Neutro Abs: 4.5 10*3/uL (ref 1.4–7.7)
Neutrophils Relative %: 55.1 % (ref 43.0–77.0)
RBC: 4.23 Mil/uL (ref 3.87–5.11)
RDW: 16.7 % — ABNORMAL HIGH (ref 11.5–14.6)

## 2011-12-15 LAB — BASIC METABOLIC PANEL
BUN: 22 mg/dL (ref 6–23)
Creatinine, Ser: 0.8 mg/dL (ref 0.4–1.2)
GFR: 73.28 mL/min (ref >60.00)

## 2011-12-15 LAB — POCT URINALYSIS DIPSTICK
Bilirubin, UA: NEGATIVE
Ketones, UA: NEGATIVE
Spec Grav, UA: 1.025

## 2011-12-15 LAB — TSH: TSH: 1.82 u[IU]/mL (ref 0.35–5.50)

## 2011-12-15 NOTE — Progress Notes (Signed)
Subjective:    Patient ID: Angel Irwin, female    DOB: [birth year], 61 y.o.   MRN: [id number]  HPI 61 year old white female, nonsmoker is in with concerns of fatigue, palpitations, and weight gain that has occurred over the last one month. She has a history of cardiac arrhythmia for which she's never been able to feel any cardiac discomfort. Has had an increase in the on a stress in her life over the last several days. She is now able to feel her heart speeds up and so down. Reports about a 10 pound weight gain in the last month. She denies any lightheadedness, dizziness, chest pain, shortness of breath, or edema.   Review of Systems  Constitutional: Positive for fatigue and unexpected weight change.  HENT: Negative.   Eyes: Negative.   Respiratory: Negative.  Negative for shortness of breath.   Cardiovascular: Positive for palpitations. Negative for chest pain and leg swelling.  Gastrointestinal: Negative.   Genitourinary: Negative.   Musculoskeletal: Negative.   Skin: Negative.   Neurological: Negative.  Negative for dizziness and light-headedness.  Hematological: Negative.   Psychiatric/Behavioral: Negative.    Past Medical History  Diagnosis Date  . Complication of anesthesia     slow to awaken  . Asthma     albuterol-rescue inhaler-uses 1-2 x year  . GERD (gastroesophageal reflux disease)     no meds    History   Social History  . Marital Status: Married    Spouse Name: N/A    Number of Children: N/A  . Years of Education: N/A   Occupational History  . Not on file.   Social History Main Topics  . Smoking status: Never Smoker   . Smokeless tobacco: Not on file  . Alcohol Use: Yes  . Drug Use: No  . Sexually Active:    Other Topics Concern  . Not on file   Social History Narrative  . No narrative on file    Past Surgical History  Procedure Date  . Dilation and curettage of uterus 1980  . Colonoscopy   . Wisdom tooth extraction   . Vaginal  hysterectomy 02/22/2011    Procedure: HYSTERECTOMY VAGINAL;  Surgeon: Purcell Nails, MD;  Location: [facility];  Service: Gynecology;  Laterality: N/A;  . Anterior and posterior repair 02/22/2011    Procedure: ANTERIOR (CYSTOCELE) AND POSTERIOR REPAIR (RECTOCELE);  Surgeon: Purcell Nails, MD;  Location: [facility];  Service: Gynecology;  Laterality: N/A;  . Cystoscopy 02/22/2011    Procedure: CYSTOSCOPY;  Surgeon: Purcell Nails, MD;  Location: [facility];  Service: Gynecology;  Laterality: N/A;  . Salpingoophorectomy 02/22/2011    Procedure: SALPINGO OOPHERECTOMY;  Surgeon: Purcell Nails, MD;  Location: [facility];  Service: Gynecology;  Laterality: Bilateral;    Family History  Problem Relation Age of Onset  . Diabetes Father   . Cancer Sister 44    lung  . Heart disease Maternal Grandmother   . Miscarriages / Stillbirths Maternal Grandmother   . Heart disease Maternal Grandfather     No Known Allergies  Current Outpatient Prescriptions on File Prior to Visit  Medication Sig Dispense Refill  . conjugated estrogens (PREMARIN) vaginal cream Place vaginally daily. Place 1/2 gram per vagina every day  for two weeks then three times per week for a total of 6 weeks.  42.5 g  1    BP 120/82  Temp 98.1 F (36.7 C) (Oral)  Wt 174 lb (78.926 kg)chart    Objective:  Physical Exam  Constitutional: She is oriented to person, place, and time. She appears well-developed and well-nourished.  HENT:  Right Ear: External ear normal.  Left Ear: External ear normal.  Nose: Nose normal.  Mouth/Throat: Oropharynx is clear and moist.  Neck: Normal range of motion. Neck supple.  Cardiovascular: Normal rate, regular rhythm and normal heart sounds.   Pulmonary/Chest: Effort normal and breath sounds normal.  Abdominal: Soft. Bowel sounds are normal.  Musculoskeletal: Normal range of motion.  Neurological: She is alert and oriented to person, place, and time. She has normal reflexes.  Skin: Skin is warm  and dry.  Psychiatric: She has a normal mood and affect.     EKG: Within normal limits, no acute abnormality.     Assessment & Plan:  Assessment: Fatigue, palpitations, weight gain  Plan: Lab sent to include TSH, BMP, CBC with patient pending results. Stress reduction techniques. Avoid caffeine. We'll consider referral to cardiology if her labs are normal. Recheck a schedule, and when necessary.

## 2011-12-15 NOTE — Patient Instructions (Addendum)
Cardiac Arrhythmia Your heart is a muscle that works to pump blood through your body by regular contractions. The beating of your heart is controlled by a system of special pacemaker cells. These cells control the electrical activity of the heart. When the system controlling this regular beating is disturbed, a heart rhythm abnormality (arrhythmia) results. WHEN YOUR HEART SKIPS A BEAT One of the most common and least serious heart arrhythmias is called an ectopic or premature atrial heartbeat (PAC). This may be noticed as a small change in your regular pulse. A PAC originates from the top part (atrium) of the heart. Within the right atrium, the SA node is the area that normally controls the regularity of the heart. PACs occur in heart tissue outside of the SA node region. You may feel this as a skipped beat or heart flutter, especially if several occur in succession or occur frequently.  Another arrhythmia is ventricular premature complex (VCP or PVC). These extra beats start out in the bottom, more muscular chambers of the heart. In most cases a PVC is harmless. If there are underlying causes that are making the heart irritable such as an overactive thyroid or a prior heart attack PVCs may be of more concern. In a few cases, medications to control the heart rhythm may be prescribed. Things to try at home:  Cut down or avoid alcohol, tobacco and caffeine.   Get enough sleep.   Reduce stress.   Exercise more.  WHEN THE HEART BEATS TOO FAST Atrial tachycardia is a fast heart rate, which starts out in the atrium. It may last from minutes to much longer. Your heart may beat 140 to 240 times per minute instead of the normal 60 to 100.  Symptoms include a worried feeling (anxiety) and a sense that your heart is beating fast and hard.   You may be able to stop the fast rate by holding your breath or bearing down as if you were going to have a bowel movement.   This type of fast rate is usually not  dangerous.  Atrial fibrillation and atrial flutter are other fast rhythms that start in the atria. Both conditions keep the atria from filling with enough blood so the heart does not work well.  Symptoms include feeling light-headed or faint.   These fast rates may be the result of heart damage or disease. Too much thyroid hormone may play a role.   There may be no clear cause or it may be from heart disease or damage.   Medication or a special electrical treatment (cardioversion) may be needed to get the heart beating normally.  Ventricular tachycardia is a fast heart rate that starts in the lower muscular chambers (ventricles) This is a serious disorder that requires treatment as soon as possible. You need someone else to get and use a small defibrillator.  Symptoms include collapse, chest pain, or being short of breath.   Treatment may include medication, procedures to improve blood flow to the heart, or an implantable cardiac defibrillator (ICD).  DIAGNOSIS   A cardiogram (EKG or ECG) will be done to see the arrhythmia, as well as lab tests to check the underlying cause.   If the extra beats or fast rate come and go, you may wear a Holter monitor that records your heart rate for a longer period of time.  SEEK MEDICAL CARE IF:  You have irregular or fast heartbeats (palpitations).   You experience skipped beats.   You develop lightheadedness.  You have chest discomfort.   You have shortness of breath.   You have more frequent episodes, if you are already being treated.  SEEK IMMEDIATE MEDICAL CARE IF:   You have severe chest pain, especially if the pain is crushing or pressure-like and spreads to the arms, back, neck, or jaw, or if you have sweating, feeling sick to your stomach (nausea), or shortness of breath. THIS IS AN EMERGENCY. Do not wait to see if the pain will go away. Get medical help at once. Call 911 or 0 (operator). DO NOT drive yourself to the hospital.   You  feel dizzy or faint.   You have episodes of previously documented atrial tachycardia that do not resolve with the techniques your caregiver has taught you.   Irregular or rapid heartbeats begin to occur more often than in the past, especially if they are associated with more pronounced symptoms or of longer duration.  Document Released: 04/05/2005 Document Revised: 03/25/2011 Document Reviewed: 11/22/2007 [facility] Patient Information 2012 [place], [place].

## 2012-01-25 ENCOUNTER — Ambulatory Visit (INDEPENDENT_AMBULATORY_CARE_PROVIDER_SITE_OTHER): Payer: BC Managed Care – PPO | Admitting: Family Medicine

## 2012-01-25 ENCOUNTER — Encounter: Payer: Self-pay | Admitting: Family Medicine

## 2012-01-25 VITALS — BP 112/70 | HR 77 | Temp 98.2°F | Wt 175.0 lb

## 2012-01-25 DIAGNOSIS — J069 Acute upper respiratory infection, unspecified: Secondary | ICD-10-CM

## 2012-01-25 MED ORDER — GUAIFENESIN-CODEINE 100-10 MG/5ML PO SYRP: 5.0000 mL | ORAL_SOLUTION | Freq: Every evening | ORAL | Status: DC | PRN

## 2012-01-25 MED ORDER — FLUTICASONE PROPIONATE 50 MCG/ACT NA SUSP: 2.0000 | Freq: Every day | NASAL | Status: DC

## 2012-01-25 NOTE — Progress Notes (Signed)
Chief Complaint  Patient presents with  . Cough    feverish, achy started on Friday     HPI:  Cough: -started about 2-3 days ago  -symptoms: productive cough, nasal congestion, drainage in throat,  -denies: fevers, tooth pain, sinus pain, sore throat, SOB -has asthma: did use albuterol once in last few days for cough   ROS: See pertinent positives and negatives per HPI.  Past Medical History  Diagnosis Date  . Complication of anesthesia     slow to awaken  . Asthma     albuterol-rescue inhaler-uses 1-2 x year  . GERD (gastroesophageal reflux disease)     no meds    Family History  Problem Relation Age of Onset  . Diabetes Father   . Cancer Sister 22    lung  . Heart disease Maternal Grandmother   . Miscarriages / Stillbirths Maternal Grandmother   . Heart disease Maternal Grandfather     History   Social History  . Marital Status: Married    Spouse Name: N/A    Number of Children: N/A  . Years of Education: N/A   Social History Main Topics  . Smoking status: Never Smoker   . Smokeless tobacco: None  . Alcohol Use: Yes  . Drug Use: No  . Sexually Active:    Other Topics Concern  . None   Social History Narrative  . None    Current outpatient prescriptions:conjugated estrogens (PREMARIN) vaginal cream, Place vaginally daily. Place 1/2 gram per vagina every day  for two weeks then three times per week for a total of 6 weeks., Disp: 42.5 g, Rfl: 1;  fluticasone (FLONASE) 50 MCG/ACT nasal spray, Place 2 sprays into the nose daily., Disp: 16 g, Rfl: 0 guaiFENesin-codeine (ROBITUSSIN AC) 100-10 MG/5ML syrup, Take 5 mLs by mouth at bedtime as needed for cough., Disp: 120 mL, Rfl: 0  EXAM:  Filed Vitals:   01/25/12 1042  BP: 112/70  Pulse: 77  Temp: 98.2 F (36.8 C)    There is no height on file to calculate BMI.  GENERAL: vitals reviewed and listed above, alert, oriented, appears well hydrated and in no acute distress  HEENT: atraumatic, conjunttiva  clear, no obvious abnormalities on inspection of external nose and ears, ear canals normal, TMs normal, clear rhinorrhea, PND  NECK: no obvious masses on inspection  LUNGS: clear to auscultation bilaterally, no wheezes, rales or rhonchi, good air movement  CV: HRRR, no peripheral edema  MS: moves all extremities without noticeable abnormality  PSYCH: pleasant and cooperative, no obvious depression or anxiety  ASSESSMENT AND PLAN:  Discussed the following assessment and plan:  1. Viral upper respiratory illness    -cough medication and flonase provided - discussed risks/benefits -advised to make appt with PCP to discuss anemia -lungs clear on exam and no SOB - advised if worsening cough, SOB, wheezing to let us no immediately -Patient advised to return to notify a doctor immediately if symptoms worsen or persist or new concerns arise.  Patient Instructions  INSTRUCTIONS FOR UPPER RESPIRATORY INFECTION:  -plenty of rest and fluids  -nasal saline (use prepackaged nasal saline or bottled/distilled water if making your own)  -clean nose with nasal saline before using the nasal steroid or sinex  -can use sinex nasal spray for drainage and nasal congestion - but do NOT use longer then 3-4 days  -can use tylenol or ibuprofen as directed for aches and sorethroat  -if you are taking a cough medication - use only  as directed  -follow up if fevers, worsening or not better in in 7 days       Dannya Pitkin, [facility] R.

## 2012-01-25 NOTE — Patient Instructions (Addendum)
INSTRUCTIONS FOR UPPER RESPIRATORY INFECTION:  -plenty of rest and fluids  -nasal saline (use prepackaged nasal saline or bottled/distilled water if making your own)  -clean nose with nasal saline before using the nasal steroid or sinex  -can use sinex nasal spray for drainage and nasal congestion - but do NOT use longer then 3-4 days  -can use tylenol or ibuprofen as directed for aches and sorethroat  -if you are taking a cough medication - use only as directed  -follow up if fevers, worsening or not better in in 7 days

## 2013-04-27 ENCOUNTER — Other Ambulatory Visit (INDEPENDENT_AMBULATORY_CARE_PROVIDER_SITE_OTHER): Payer: BC Managed Care – PPO

## 2013-04-27 DIAGNOSIS — Z0000 Encounter for general adult medical examination without abnormal findings: Secondary | ICD-10-CM

## 2013-04-27 LAB — HEPATIC FUNCTION PANEL
ALT: 29 U/L (ref 0–35)
AST: 23 U/L (ref 0–37)
Albumin: 3.8 g/dL (ref 3.5–5.2)
Alkaline Phosphatase: 86 U/L (ref 39–117)
BILIRUBIN DIRECT: 0.1 mg/dL (ref 0.0–0.3)
BILIRUBIN TOTAL: 0.5 mg/dL (ref 0.3–1.2)
Total Protein: 7.3 g/dL (ref 6.0–8.3)

## 2013-04-27 LAB — BASIC METABOLIC PANEL
BUN: 16 mg/dL (ref 6–23)
CALCIUM: 9.5 mg/dL (ref 8.4–10.5)
CO2: 27 mEq/L (ref 19–32)
CREATININE: 0.7 mg/dL (ref 0.4–1.2)
Chloride: 106 mEq/L (ref 96–112)
GFR: 90.03 mL/min (ref >60.00)
Glucose, Bld: 81 mg/dL (ref 70–99)
Potassium: 4.1 mEq/L (ref 3.5–5.1)
Sodium: 138 mEq/L (ref 135–145)

## 2013-04-27 LAB — CBC WITH DIFFERENTIAL/PLATELET
Basophils Absolute: 0.1 10*3/uL (ref 0.0–0.1)
Basophils Relative: 1 % (ref 0.0–3.0)
EOS PCT: 2.4 % (ref 0.0–5.0)
Eosinophils Absolute: 0.1 10*3/uL (ref 0.0–0.7)
HCT: 35.4 % — ABNORMAL LOW (ref 36.0–46.0)
Hemoglobin: 11.5 g/dL — ABNORMAL LOW (ref 12.0–15.0)
Lymphocytes Relative: 37.3 % (ref 12.0–46.0)
Lymphs Abs: 1.9 10*3/uL (ref 0.7–4.0)
MCHC: 32.4 g/dL (ref 30.0–36.0)
MCV: 78.4 fl (ref 78.0–100.0)
MONO ABS: 0.4 10*3/uL (ref 0.1–1.0)
MONOS PCT: 7.5 % (ref 3.0–12.0)
NEUTROS PCT: 51.8 % (ref 43.0–77.0)
Neutro Abs: 2.7 10*3/uL (ref 1.4–7.7)
PLATELETS: 264 10*3/uL (ref 150.0–400.0)
RBC: 4.52 Mil/uL (ref 3.87–5.11)
RDW: 17.3 % — ABNORMAL HIGH (ref 11.5–14.6)
WBC: 5.2 10*3/uL (ref 4.5–10.5)

## 2013-04-27 LAB — POCT URINALYSIS DIPSTICK
BILIRUBIN UA: NEGATIVE
Blood, UA: NEGATIVE
Glucose, UA: NEGATIVE
KETONES UA: NEGATIVE
Nitrite, UA: NEGATIVE
PH UA: 6.5
Protein, UA: NEGATIVE
Spec Grav, UA: 1.015
Urobilinogen, UA: 0.2

## 2013-04-27 LAB — LIPID PANEL
CHOLESTEROL: 219 mg/dL — AB (ref 0–200)
HDL: 43.4 mg/dL (ref >39.00)
TRIGLYCERIDES: 127 mg/dL (ref 0.0–149.0)
Total CHOL/HDL Ratio: 5
VLDL: 25.4 mg/dL (ref 0.0–40.0)

## 2013-04-27 LAB — LDL CHOLESTEROL, DIRECT: Direct LDL: 146.7 mg/dL

## 2013-04-27 LAB — TSH: TSH: 0.97 u[IU]/mL (ref 0.35–5.50)

## 2013-05-04 ENCOUNTER — Ambulatory Visit (INDEPENDENT_AMBULATORY_CARE_PROVIDER_SITE_OTHER): Payer: BC Managed Care – PPO | Admitting: Family

## 2013-05-04 ENCOUNTER — Encounter: Payer: Self-pay | Admitting: Family

## 2013-05-04 ENCOUNTER — Other Ambulatory Visit: Payer: Self-pay | Admitting: Family

## 2013-05-04 VITALS — BP 114/64 | HR 64 | Ht 60.0 in | Wt 163.0 lb

## 2013-05-04 DIAGNOSIS — Z23 Encounter for immunization: Secondary | ICD-10-CM

## 2013-05-04 DIAGNOSIS — D1779 Benign lipomatous neoplasm of other sites: Secondary | ICD-10-CM

## 2013-05-04 DIAGNOSIS — D1720 Benign lipomatous neoplasm of skin and subcutaneous tissue of unspecified limb: Secondary | ICD-10-CM

## 2013-05-04 DIAGNOSIS — Z1231 Encounter for screening mammogram for malignant neoplasm of breast: Secondary | ICD-10-CM

## 2013-05-04 DIAGNOSIS — Z0000 Encounter for general adult medical examination without abnormal findings: Secondary | ICD-10-CM

## 2013-05-04 MED ORDER — ALBUTEROL SULFATE HFA 108 (90 BASE) MCG/ACT IN AERS
2.0000 | INHALATION_SPRAY | Freq: Four times a day (QID) | RESPIRATORY_TRACT | Status: DC | PRN
Start: 2013-05-04 — End: 2014-09-09

## 2013-05-04 NOTE — Progress Notes (Signed)
   Subjective:    Patient ID: Angel Irwin, female    DOB: Apr 28, 1950, 63 y.o.   MRN: [id number]  HPI 63 year old WF, nonsmoker, is a routine physical examination for this healthy  Female. Reviewed all health maintenance protocols including mammography colonoscopy bone density and reviewed appropriate screening labs. Her immunization history was reviewed as well as her current medications and allergies refills of her chronic medications were given and the plan for yearly health maintenance was discussed all orders and referrals were made as appropriate.   Review of Systems  Constitutional: Negative.   HENT: Negative.   Eyes: Negative.   Respiratory: Negative.   Cardiovascular: Negative.   Gastrointestinal: Negative.   Endocrine: Negative.   Genitourinary: Negative.   Musculoskeletal: Negative.   Skin: Negative.        Knots to the right thigh  Allergic/Immunologic: Negative.   Neurological: Negative.   Hematological: Negative.   Psychiatric/Behavioral: Negative.        Objective:   Physical Exam  Constitutional: She is oriented to person, place, and time. She appears well-developed and well-nourished.  HENT:  Head: Normocephalic.  Right Ear: External ear normal.  Left Ear: External ear normal.  Nose: Nose normal.  Mouth/Throat: Oropharynx is clear and moist.  Eyes: Conjunctivae and EOM are normal. Pupils are equal, round, and reactive to light.  Neck: Normal range of motion. Neck supple. No thyromegaly present.  Cardiovascular: Regular rhythm and normal heart sounds.   Pulmonary/Chest: Effort normal and breath sounds normal.  Abdominal: Soft. Bowel sounds are normal.  Musculoskeletal: Normal range of motion.  Neurological: She is alert and oriented to person, place, and time. She has normal reflexes.  Skin: Skin is warm and dry.  Psychiatric: She has a normal mood and affect.    Tdap administered Influenza administered.       Assessment & Plan:  Assessment:    1. CPX 2. Hypercholesterolemia 3. Lipoma  Plan: Encourages a healthy diet, exercise, monthly self breast exams. Low cholesterol diet. Exercise 3-4 times a week. Recheck cholesterol in 6 months.

## 2013-05-04 NOTE — Patient Instructions (Addendum)
Lipoma A lipoma is a noncancerous (benign) tumor composed of fat cells. They are usually found under the skin (subcutaneous). A lipoma may occur in any tissue of the body that contains fat. Common areas for lipomas to appear include the back, shoulders, buttocks, and thighs. Lipomas are a very common soft tissue growth. They are soft and grow slowly. Most problems caused by a lipoma depend on where it is growing. DIAGNOSIS  A lipoma can be diagnosed with a physical exam. These tumors rarely become cancerous, but radiographic studies can help determine this for certain. Studies used may include:  Computerized X-ray scans (CT or CAT scan).  Computerized magnetic scans (MRI). TREATMENT  Small lipomas that are not causing problems may be watched. If a lipoma continues to enlarge or causes problems, removal is often the best treatment. Lipomas can also be removed to improve appearance. Surgery is done to remove the fatty cells and the surrounding capsule. Most often, this is done with medicine that numbs the area (local anesthetic). The removed tissue is examined under a microscope to make sure it is not cancerous. Keep all follow-up appointments with your caregiver. SEEK MEDICAL CARE IF:   The lipoma becomes larger or hard.  The lipoma becomes painful, red, or increasingly swollen. These could be signs of infection or a more serious condition. Document Released: 03/26/2002 Document Revised: 06/28/2011 Document Reviewed: 09/05/2009 [facility] Patient Information 2014 [place], [place].  Fat and Cholesterol Control Diet Fat and cholesterol levels in your blood and organs are influenced by your diet. High levels of fat and cholesterol may lead to diseases of the heart, small and large blood vessels, gallbladder, liver, and pancreas. CONTROLLING FAT AND CHOLESTEROL WITH DIET Although exercise and lifestyle factors are important, your diet is key. That is because certain foods are known to raise cholesterol  and others to lower it. The goal is to balance foods for their effect on cholesterol and more importantly, to replace saturated and trans fat with other types of fat, such as monounsaturated fat, polyunsaturated fat, and omega-3 fatty acids. On average, a person should consume no more than 15 to 17 g of saturated fat daily. Saturated and trans fats are considered "bad" fats, and they will raise LDL cholesterol. Saturated fats are primarily found in animal products such as meats, butter, and cream. However, that does not mean you need to give up all your favorite foods. Today, there are good tasting, low-fat, low-cholesterol substitutes for most of the things you like to eat. Choose low-fat or nonfat alternatives. Choose round or loin cuts of red meat. These types of cuts are lowest in fat and cholesterol. Chicken (without the skin), fish, veal, and ground [place] breast are great choices. Eliminate fatty meats, such as hot dogs and salami. Even shellfish have little or no saturated fat. Have a 3 oz (85 g) portion when you eat lean meat, poultry, or fish. Trans fats are also called "partially hydrogenated oils." They are oils that have been scientifically manipulated so that they are solid at room temperature resulting in a longer shelf life and improved taste and texture of foods in which they are added. Trans fats are found in stick margarine, some tub margarines, cookies, crackers, and baked goods.  When baking and cooking, oils are a great substitute for butter. The monounsaturated oils are especially beneficial since it is believed they lower LDL and raise HDL. The oils you should avoid entirely are saturated tropical oils, such as coconut and palm.  Remember to  eat a lot from food groups that are naturally free of saturated and trans fat, including fish, fruit, vegetables, beans, grains (barley, rice, couscous, bulgur wheat), and pasta (without cream sauces).  IDENTIFYING FOODS THAT LOWER FAT AND  CHOLESTEROL  Soluble fiber may lower your cholesterol. This type of fiber is found in fruits such as apples, vegetables such as broccoli, potatoes, and carrots, legumes such as beans, peas, and lentils, and grains such as barley. Foods fortified with plant sterols (phytosterol) may also lower cholesterol. You should eat at least 2 g per day of these foods for a cholesterol lowering effect.  Read package labels to identify low-saturated fats, trans fat free, and low-fat foods at the supermarket. Select cheeses that have only 2 to 3 g saturated fat per ounce. Use a heart-healthy tub margarine that is free of trans fats or partially hydrogenated oil. When buying baked goods (cookies, crackers), avoid partially hydrogenated oils. Breads and muffins should be made from whole grains (whole-wheat or whole oat flour, instead of "flour" or "enriched flour"). Buy non-creamy canned soups with reduced salt and no added fats.  FOOD PREPARATION TECHNIQUES  Never deep-fry. If you must fry, either stir-fry, which uses very little fat, or use non-stick cooking sprays. When possible, broil, bake, or roast meats, and steam vegetables. Instead of putting butter or margarine on vegetables, use lemon and herbs, applesauce, and cinnamon (for squash and sweet potatoes). Use nonfat yogurt, salsa, and low-fat dressings for salads.  LOW-SATURATED FAT / LOW-FAT FOOD SUBSTITUTES Meats / Saturated Fat (g)  Avoid: Steak, marbled (3 oz/85 g) / 11 g  Choose: Steak, lean (3 oz/85 g) / 4 g  Avoid: Hamburger (3 oz/85 g) / 7 g  Choose: Hamburger, lean (3 oz/85 g) / 5 g  Avoid: Ham (3 oz/85 g) / 6 g  Choose: Ham, lean cut (3 oz/85 g) / 2.4 g  Avoid: Chicken, with skin, dark meat (3 oz/85 g) / 4 g  Choose: Chicken, skin removed, dark meat (3 oz/85 g) / 2 g  Avoid: Chicken, with skin, light meat (3 oz/85 g) / 2.5 g  Choose: Chicken, skin removed, light meat (3 oz/85 g) / 1 g Dairy / Saturated Fat (g)  Avoid: Whole milk (1 cup)  / 5 g  Choose: Low-fat milk, 2% (1 cup) / 3 g  Choose: Low-fat milk, 1% (1 cup) / 1.5 g  Choose: Skim milk (1 cup) / 0.3 g  Avoid: Hard cheese (1 oz/28 g) / 6 g  Choose: Skim milk cheese (1 oz/28 g) / 2 to 3 g  Avoid: Cottage cheese, 4% fat (1 cup) / 6.5 g  Choose: Low-fat cottage cheese, 1% fat (1 cup) / 1.5 g  Avoid: Ice cream (1 cup) / 9 g  Choose: Sherbet (1 cup) / 2.5 g  Choose: Nonfat frozen yogurt (1 cup) / 0.3 g  Choose: Frozen fruit bar / trace  Avoid: Whipped cream (1 tbs) / 3.5 g  Choose: Nondairy whipped topping (1 tbs) / 1 g Condiments / Saturated Fat (g)  Avoid: Mayonnaise (1 tbs) / 2 g  Choose: Low-fat mayonnaise (1 tbs) / 1 g  Avoid: Butter (1 tbs) / 7 g  Choose: Extra light margarine (1 tbs) / 1 g  Avoid: Coconut oil (1 tbs) / 11.8 g  Choose: Olive oil (1 tbs) / 1.8 g  Choose: Corn oil (1 tbs) / 1.7 g  Choose: Safflower oil (1 tbs) / 1.2 g  Choose: Sunflower oil (1 tbs) /  1.4 g  Choose: Soybean oil (1 tbs) / 2.4 g  Choose: Canola oil (1 tbs) / 1 g Document Released: 04/05/2005 Document Revised: 07/31/2012 Document Reviewed: 09/24/2010 [facility] Patient Information 2014 [place].

## 2013-05-28 ENCOUNTER — Ambulatory Visit
Admission: RE | Admit: 2013-05-28 | Discharge: 2013-05-28 | Disposition: A | Discharge status: Home / Self Care | Payer: BC Managed Care – PPO | Source: Ambulatory Visit | Attending: Family | Admitting: Family

## 2013-05-28 DIAGNOSIS — Z1231 Encounter for screening mammogram for malignant neoplasm of breast: Secondary | ICD-10-CM

## 2014-04-08 ENCOUNTER — Ambulatory Visit (INDEPENDENT_AMBULATORY_CARE_PROVIDER_SITE_OTHER): Payer: BC Managed Care – PPO | Admitting: Family

## 2014-04-08 ENCOUNTER — Telehealth: Payer: Self-pay

## 2014-04-08 ENCOUNTER — Ambulatory Visit (INDEPENDENT_AMBULATORY_CARE_PROVIDER_SITE_OTHER): Payer: BC Managed Care – PPO

## 2014-04-08 ENCOUNTER — Encounter: Payer: Self-pay | Admitting: Family

## 2014-04-08 VITALS — BP 100/60 | HR 98 | Temp 98.6°F | Wt 172.0 lb

## 2014-04-08 DIAGNOSIS — R059 Cough, unspecified: Secondary | ICD-10-CM

## 2014-04-08 DIAGNOSIS — R05 Cough: Secondary | ICD-10-CM

## 2014-04-08 DIAGNOSIS — Z23 Encounter for immunization: Secondary | ICD-10-CM

## 2014-04-08 DIAGNOSIS — J209 Acute bronchitis, unspecified: Secondary | ICD-10-CM

## 2014-04-08 DIAGNOSIS — J45909 Unspecified asthma, uncomplicated: Secondary | ICD-10-CM

## 2014-04-08 MED ORDER — METHYLPREDNISOLONE 4 MG PO KIT: PACK | ORAL | Status: AC

## 2014-04-08 MED ORDER — GUAIFENESIN-CODEINE 100-10 MG/5ML PO SYRP: 5.0000 mL | ORAL_SOLUTION | Freq: Three times a day (TID) | ORAL | Status: DC | PRN

## 2014-04-08 NOTE — Telephone Encounter (Signed)
Please call pt to schedule acute OV for asthma

## 2014-04-08 NOTE — Progress Notes (Signed)
Subjective:    Patient ID: Angel Irwin, female    DOB: 1951/03/09, 63 y.o.   MRN: [id number]  HPI  63 year old white female, nonsmoker in today with complaints of cough, congestion that began 2 weeks ago. The symptoms appear to have cleared for about a week and returned. Cough is nonproductive. Has intermittent wheezing. Has been using albuterol that helps some. Does report shortness of breath but denies any chest pain. Has a history of asthma.  Review of Systems  Constitutional: Negative.   HENT: Negative.   Respiratory: Positive for cough, shortness of breath and wheezing.   Cardiovascular: Negative.   Gastrointestinal: Negative.   Endocrine: Negative.   Genitourinary: Negative.   Musculoskeletal: Negative.   Skin: Negative.   Allergic/Immunologic: Negative.   Neurological: Negative.   Hematological: Negative.   Psychiatric/Behavioral: Negative.    Past Medical History  Diagnosis Date  . Complication of anesthesia     slow to awaken  . Asthma     albuterol-rescue inhaler-uses 1-2 x year  . GERD (gastroesophageal reflux disease)     no meds    History   Social History  . Marital Status: Married    Spouse Name: N/A    Number of Children: N/A  . Years of Education: N/A   Occupational History  . Not on file.   Social History Main Topics  . Smoking status: Never Smoker   . Smokeless tobacco: Not on file  . Alcohol Use: Yes  . Drug Use: No  . Sexual Activity: Not on file   Other Topics Concern  . Not on file   Social History Narrative    Past Surgical History  Procedure Laterality Date  . Dilation and curettage of uterus  1980  . Colonoscopy    . Wisdom tooth extraction    . Vaginal hysterectomy  02/22/2011    Procedure: HYSTERECTOMY VAGINAL;  Surgeon: Delice Lesch, MD;  Location: [facility];  Service: Gynecology;  Laterality: N/A;  . Anterior and posterior repair  02/22/2011    Procedure: ANTERIOR (CYSTOCELE) AND POSTERIOR REPAIR (RECTOCELE);   Surgeon: Delice Lesch, MD;  Location: [facility];  Service: Gynecology;  Laterality: N/A;  . Cystoscopy  02/22/2011    Procedure: CYSTOSCOPY;  Surgeon: Delice Lesch, MD;  Location: [facility];  Service: Gynecology;  Laterality: N/A;  . Salpingoophorectomy  02/22/2011    Procedure: SALPINGO OOPHERECTOMY;  Surgeon: Delice Lesch, MD;  Location: [facility];  Service: Gynecology;  Laterality: Bilateral;    Family History  Problem Relation Age of Onset  . Diabetes Father   . Cancer Sister 30    lung  . Heart disease Maternal Grandmother   . Miscarriages / Stillbirths Maternal Grandmother   . Heart disease Maternal Grandfather     No Known Allergies  Current Outpatient Prescriptions on File Prior to Visit  Medication Sig Dispense Refill  . albuterol (PROVENTIL HFA;VENTOLIN HFA) 108 (90 BASE) MCG/ACT inhaler Inhale 2 puffs into the lungs every 6 (six) hours as needed for wheezing or shortness of breath. 1 Inhaler 3  . fluticasone (FLONASE) 50 MCG/ACT nasal spray Place 2 sprays into the nose daily. 16 g 0   No current facility-administered medications on file prior to visit.    BP 100/60 mmHg  Pulse 98  Temp(Src) 98.6 F (37 C) (Oral)  Wt 172 lb (78.019 kg)  SpO2 97%chart    Objective:   Physical Exam  Constitutional: She is oriented to person, place, and time. She appears  well-developed and well-nourished.  HENT:  Right Ear: External ear normal.  Left Ear: External ear normal.  Nose: Nose normal.  Mouth/Throat: Oropharynx is clear and moist.  Neck: Normal range of motion. Neck supple.  Cardiovascular: Normal rate, regular rhythm and normal heart sounds.   Pulmonary/Chest: Effort normal and breath sounds normal. She has no wheezes.  Abdominal: Soft. Bowel sounds are normal.  Musculoskeletal: Normal range of motion.  Neurological: She is alert and oriented to person, place, and time.  Skin: Skin is warm and dry.  Psychiatric: She has a normal mood and affect.            Assessment & Plan:  Angel Irwin was seen today for asthma.  Diagnoses and associated orders for this visit:  Acute bronchitis, unspecified organism  Cough  Other Orders - methylPREDNISolone (MEDROL DOSEPAK) 4 MG tablet; follow package directions - guaiFENesin-codeine (CHERATUSSIN AC) 100-10 MG/5ML syrup; Take 5 mLs by mouth 3 (three) times daily as needed.    Call the office with any questions or concerns. Recheck for complete physical exam in 3 months and sooner as needed.

## 2014-04-08 NOTE — Telephone Encounter (Signed)
Please advise 

## 2014-04-08 NOTE — Patient Instructions (Signed)

## 2014-04-08 NOTE — Telephone Encounter (Signed)
[facility] - Client [place] [facility] Patient Name: ZAILEE VALLELY Gender: Female DOB: 1950-12-06 Age: 63 Y 3 M 4 D Return Phone Number: [phone] (Primary) Address: [place] City/State/Zip: [place] [address] Client [facility] - Client Client Site [place] - Day Physician Roxy Cedar Contact Type Call [place] Name [place] Phone Number [phone] Relationship To Patient Self Is this call to report lab results? No Call Type General Information Initial Comment Caller states she is having trouble with her asthma. General Information Type Appointment Nurse Assessment Guidelines Guideline Title Affirmed Question Affirmed Notes Nurse Date/Time (Eastern Time) Disp. Time Eilene Ghazi Time) Disposition Final User 04/08/2014 8:20:27 AM General Information Provided Yes Laney Pastor After Care Instructions Given Call Event Type User Date / Time Description

## 2014-04-08 NOTE — Telephone Encounter (Signed)
Pt has been sch

## 2014-04-08 NOTE — Telephone Encounter (Signed)
Schedule appt to be seen.

## 2014-04-08 NOTE — Progress Notes (Signed)
Pre visit review using our clinic review tool, if applicable. No additional management support is needed unless otherwise documented below in the visit note. 

## 2014-05-13 ENCOUNTER — Other Ambulatory Visit (INDEPENDENT_AMBULATORY_CARE_PROVIDER_SITE_OTHER): Payer: BLUE CROSS/BLUE SHIELD | Admitting: *Deleted

## 2014-05-13 DIAGNOSIS — Z0000 Encounter for general adult medical examination without abnormal findings: Secondary | ICD-10-CM

## 2014-05-14 ENCOUNTER — Other Ambulatory Visit (INDEPENDENT_AMBULATORY_CARE_PROVIDER_SITE_OTHER): Payer: BLUE CROSS/BLUE SHIELD

## 2014-05-14 DIAGNOSIS — Z0000 Encounter for general adult medical examination without abnormal findings: Secondary | ICD-10-CM

## 2014-05-14 DIAGNOSIS — R300 Dysuria: Secondary | ICD-10-CM

## 2014-05-14 LAB — LIPID PANEL
Cholesterol: 169 mg/dL (ref 0–200)
HDL: 38.7 mg/dL — ABNORMAL LOW (ref >39.00)
LDL Cholesterol: 101 mg/dL — ABNORMAL HIGH (ref 0–99)
NonHDL: 130.3
Total CHOL/HDL Ratio: 4
Triglycerides: 146 mg/dL (ref 0.0–149.0)
VLDL: 29.2 mg/dL (ref 0.0–40.0)

## 2014-05-14 LAB — POCT URINALYSIS DIPSTICK
BILIRUBIN UA: NEGATIVE
Glucose, UA: NEGATIVE
KETONES UA: NEGATIVE
NITRITE UA: NEGATIVE
PROTEIN UA: NEGATIVE
RBC UA: NEGATIVE
SPEC GRAV UA: 1.015
Urobilinogen, UA: 0.2
pH, UA: 5.5

## 2014-05-14 LAB — CBC WITH DIFFERENTIAL/PLATELET
Basophils Absolute: 0 10*3/uL (ref 0.0–0.1)
Basophils Relative: 0.3 % (ref 0.0–3.0)
EOS PCT: 2.9 % (ref 0.0–5.0)
Eosinophils Absolute: 0.2 10*3/uL (ref 0.0–0.7)
HCT: 26.7 % — ABNORMAL LOW (ref 36.0–46.0)
Hemoglobin: 8.2 g/dL — ABNORMAL LOW (ref 12.0–15.0)
Lymphocytes Relative: 30.6 % (ref 12.0–46.0)
Lymphs Abs: 1.8 10*3/uL (ref 0.7–4.0)
MCHC: 30.8 g/dL (ref 30.0–36.0)
MCV: 65.3 fl — ABNORMAL LOW (ref 78.0–100.0)
Monocytes Absolute: 0.4 10*3/uL (ref 0.1–1.0)
Monocytes Relative: 6.9 % (ref 3.0–12.0)
NEUTROS ABS: 3.4 10*3/uL (ref 1.4–7.7)
Neutrophils Relative %: 59.3 % (ref 43.0–77.0)
PLATELETS: 456 10*3/uL — AB (ref 150.0–400.0)
RBC: 4.09 Mil/uL (ref 3.87–5.11)
RDW: 18.3 % — AB (ref 11.5–15.5)
WBC: 5.8 10*3/uL (ref 4.0–10.5)

## 2014-05-14 LAB — COMPREHENSIVE METABOLIC PANEL
ALBUMIN: 3.8 g/dL (ref 3.5–5.2)
ALT: 15 U/L (ref 0–35)
AST: 19 U/L (ref 0–37)
Alkaline Phosphatase: 107 U/L (ref 39–117)
BUN: 16 mg/dL (ref 6–23)
CHLORIDE: 107 meq/L (ref 96–112)
CO2: 23 mEq/L (ref 19–32)
CREATININE: 0.68 mg/dL (ref 0.40–1.20)
Calcium: 9.4 mg/dL (ref 8.4–10.5)
GFR: 92.78 mL/min (ref >60.00)
Glucose, Bld: 85 mg/dL (ref 70–99)
Potassium: 4.1 mEq/L (ref 3.5–5.1)
SODIUM: 138 meq/L (ref 135–145)
TOTAL PROTEIN: 7.1 g/dL (ref 6.0–8.3)
Total Bilirubin: 0.5 mg/dL (ref 0.2–1.2)

## 2014-05-14 LAB — TSH: TSH: 2.64 u[IU]/mL (ref 0.35–4.50)

## 2014-05-14 NOTE — Addendum Note (Signed)
Addended by: Elmer Picker on: 05/14/2014 10:37 AM   Modules accepted: Orders

## 2014-05-16 LAB — URINE CULTURE

## 2014-05-21 ENCOUNTER — Encounter: Payer: Self-pay | Admitting: Family

## 2014-05-21 ENCOUNTER — Ambulatory Visit (INDEPENDENT_AMBULATORY_CARE_PROVIDER_SITE_OTHER): Payer: BLUE CROSS/BLUE SHIELD | Admitting: Family

## 2014-05-21 VITALS — BP 110/70 | HR 92 | Temp 98.0°F | Ht 62.0 in | Wt 170.2 lb

## 2014-05-21 DIAGNOSIS — D62 Acute posthemorrhagic anemia: Secondary | ICD-10-CM

## 2014-05-21 DIAGNOSIS — K319 Disease of stomach and duodenum, unspecified: Secondary | ICD-10-CM

## 2014-05-21 DIAGNOSIS — Z1231 Encounter for screening mammogram for malignant neoplasm of breast: Secondary | ICD-10-CM

## 2014-05-21 DIAGNOSIS — Z0000 Encounter for general adult medical examination without abnormal findings: Secondary | ICD-10-CM

## 2014-05-21 MED ORDER — OMEPRAZOLE 40 MG PO CPDR: 40.0000 mg | DELAYED_RELEASE_CAPSULE | Freq: Every day | ORAL | Status: DC

## 2014-05-21 NOTE — Progress Notes (Signed)
Pre visit review using our clinic review tool, if applicable. No additional management support is needed unless otherwise documented below in the visit note. 

## 2014-05-21 NOTE — Progress Notes (Signed)
Subjective:    Patient ID: Angel Irwin, female    DOB: [birth year], 64 y.o.   MRN: [id number]  HPI 64 year old white female, nonsmoker with a history of asthma is in today for complete physical. 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 The dyspnea reaction to stress that require rest and she takes omeprazole that helps. She has not seen a gastroenterologist for upper GI in about 6 years. Previously her hemoglobin dropped to 5. Because she is Jehov[employer]'s Witness, she is refused blood transfusions. Continues to refuse today.  This is a routine wellness  examination for this patient . I reviewed all health maintenance protocols including mammography, colonoscopy, bone density Needed referrals were placed. Age and diagnosis  appropriate screening labs were ordered. Her immunization history was reviewed and appropriate vaccinations were ordered. Her current medications and allergies were reviewed and needed refills of her chronic medications were ordered. The plan for yearly health maintenance was discussed all orders and referrals were made as appropriate.   Review of Systems  Constitutional: Negative.   HENT: Negative.   Respiratory: Negative.   Cardiovascular: Negative.   Musculoskeletal: Negative.   Skin: Negative.   Allergic/Immunologic: Negative.   Psychiatric/Behavioral: Negative.   All other systems reviewed and are negative.  Past Medical History  Diagnosis Date  . Complication of anesthesia     slow to awaken  . Asthma     albuterol-rescue inhaler-uses 1-2 x year  . GERD (gastroesophageal reflux disease)     no meds    History   Social History  . Marital Status: Married    Spouse Name: N/A    Number of Children: N/A  . Years of Education: N/A   Occupational History  . Not on file.   Social History Main Topics  . Smoking status: Never Smoker   . Smokeless tobacco: Not on file  . Alcohol Use:  Yes  . Drug Use: No  . Sexual Activity: Not on file   Other Topics Concern  . Not on file   Social History Narrative    Past Surgical History  Procedure Laterality Date  . Dilation and curettage of uterus  1980  . Colonoscopy    . Wisdom tooth extraction    . Vaginal hysterectomy  02/22/2011    Procedure: HYSTERECTOMY VAGINAL;  Surgeon: Delice Lesch, MD;  Location: [facility];  Service: Gynecology;  Laterality: N/A;  . Anterior and posterior repair  02/22/2011    Procedure: ANTERIOR (CYSTOCELE) AND POSTERIOR REPAIR (RECTOCELE);  Surgeon: Delice Lesch, MD;  Location: [facility];  Service: Gynecology;  Laterality: N/A;  . Cystoscopy  02/22/2011    Procedure: CYSTOSCOPY;  Surgeon: Delice Lesch, MD;  Location: [facility];  Service: Gynecology;  Laterality: N/A;  . Salpingoophorectomy  02/22/2011    Procedure: SALPINGO OOPHERECTOMY;  Surgeon: Delice Lesch, MD;  Location: [facility];  Service: Gynecology;  Laterality: Bilateral;    Family History  Problem Relation Age of Onset  . Diabetes Father   . Cancer Sister 13    lung  . Heart disease Maternal Grandmother   . Miscarriages / Stillbirths Maternal Grandmother   . Heart disease Maternal Grandfather     No Known Allergies  Current Outpatient Prescriptions on File Prior to Visit  Medication Sig Dispense Refill  . albuterol (PROVENTIL HFA;VENTOLIN HFA) 108 (90 BASE) MCG/ACT inhaler Inhale 2 puffs into the lungs every 6 (six)  hours as needed for wheezing or shortness of breath. 1 Inhaler 3   No current facility-administered medications on file prior to visit.    BP 110/70 mmHg  Pulse 92  Temp(Src) 98 F (36.7 C) (Oral)  Ht 5\' 2"  (1.575 m)  Wt 170 lb 3.2 oz (77.202 kg)  BMI 31.12 kg/m2chart    Objective:   Physical Exam  Constitutional: She is oriented to person, place, and time. She appears well-developed and well-nourished.  HENT:  Head: Normocephalic and atraumatic.  Right Ear: External ear normal.  Left Ear: External  ear normal.  Nose: Nose normal.  Mouth/Throat: Oropharynx is clear and moist.  Eyes: Conjunctivae are normal. Pupils are equal, round, and reactive to light.  Neck: Neck supple. No thyromegaly present.  Cardiovascular: Normal rate, regular rhythm and normal heart sounds.   No murmur heard. Pulmonary/Chest: Effort normal and breath sounds normal.  Abdominal: Soft. Bowel sounds are normal. She exhibits no distension. There is no tenderness. There is no rebound and no guarding.  Musculoskeletal: Normal range of motion. She exhibits no edema or tenderness.  Neurological: She is alert and oriented to person, place, and time. She has normal reflexes. She displays normal reflexes. No cranial nerve deficit. Coordination normal.  Skin: Skin is warm and dry.  Psychiatric: She has a normal mood and affect.          Assessment & Plan:  [name] was seen today for annual exam.  Diagnoses and associated orders for this visit:  Preventative health care - EKG 12-Lead  Acute blood loss anemia - CBC with Differential; Future - EKG 12-Lead - Ambulatory referral to Gastroenterology  Gastric lesion - CBC with Differential; Future - Ambulatory referral to Gastroenterology  Screening mammogram for high-risk patient - MM Digital Screening; Future  Other Orders - omeprazole (PRILOSEC) 40 MG capsule; Take 1 capsule (40 mg total) by mouth daily.    Encouraged healthy diet and exercise. Weight reduction. Follow-up in 2 weeks for recheck CBC. Referral to GI. Start omeprazole 40 mg once daily. Avoid anti-inflammatory medications. At patient's request, out of work 2 days.

## 2014-05-21 NOTE — Patient Instructions (Signed)
Anemia, Nonspecific Anemia is a condition in which the concentration of red blood cells or hemoglobin in the blood is below normal. Hemoglobin is a substance in red blood cells that carries oxygen to the tissues of the body. Anemia results in not enough oxygen reaching these tissues.  CAUSES  Common causes of anemia include:   Excessive bleeding. Bleeding may be internal or external. This includes excessive bleeding from periods (in women) or from the intestine.   Poor nutrition.   Chronic kidney, thyroid, and liver disease.  Bone marrow disorders that decrease red blood cell production.  Cancer and treatments for cancer.  HIV, AIDS, and their treatments.  Spleen problems that increase red blood cell destruction.  Blood disorders.  Excess destruction of red blood cells due to infection, medicines, and autoimmune disorders. SIGNS AND SYMPTOMS   Minor weakness.   Dizziness.   Headache.  Palpitations.   Shortness of breath, especially with exercise.   Paleness.  Cold sensitivity.  Indigestion.  Nausea.  Difficulty sleeping.  Difficulty concentrating. Symptoms may occur suddenly or they may develop slowly.  DIAGNOSIS  Additional blood tests are often needed. These help your health care provider determine the best treatment. Your health care provider will check your stool for blood and look for other causes of blood loss.  TREATMENT  Treatment varies depending on the cause of the anemia. Treatment can include:   Supplements of iron, vitamin B12, or folic acid.   Hormone medicines.   A blood transfusion. This may be needed if blood loss is severe.   Hospitalization. This may be needed if there is significant continual blood loss.   Dietary changes.  Spleen removal. HOME CARE INSTRUCTIONS Keep all follow-up appointments. It often takes many weeks to correct anemia, and having your health care provider check on your condition and your response to  treatment is very important. SEEK IMMEDIATE MEDICAL CARE IF:   You develop extreme weakness, shortness of breath, or chest pain.   You become dizzy or have trouble concentrating.  You develop heavy vaginal bleeding.   You develop a rash.   You have bloody or black, tarry stools.   You faint.   You vomit up blood.   You vomit repeatedly.   You have abdominal pain.  You have a fever or persistent symptoms for more than 2-3 days.   You have a fever and your symptoms suddenly get worse.   You are dehydrated.  MAKE SURE YOU:  Understand these instructions.  Will watch your condition.  Will get help right away if you are not doing well or get worse. Document Released: 05/13/2004 Document Revised: 12/06/2012 Document Reviewed: 09/29/2012 [employer] Patient Information 2015 [employer]. This information is not intended to replace advice given to you by your health care provider. Make sure you discuss any questions you have with your health care provider.  

## 2014-05-27 ENCOUNTER — Encounter: Payer: Self-pay | Admitting: Nurse Practitioner

## 2014-05-27 ENCOUNTER — Ambulatory Visit (INDEPENDENT_AMBULATORY_CARE_PROVIDER_SITE_OTHER): Payer: BLUE CROSS/BLUE SHIELD | Admitting: Nurse Practitioner

## 2014-05-27 ENCOUNTER — Other Ambulatory Visit (INDEPENDENT_AMBULATORY_CARE_PROVIDER_SITE_OTHER): Payer: BLUE CROSS/BLUE SHIELD

## 2014-05-27 VITALS — BP 118/72 | HR 80 | Ht 62.25 in | Wt 174.4 lb

## 2014-05-27 DIAGNOSIS — D509 Iron deficiency anemia, unspecified: Secondary | ICD-10-CM

## 2014-05-27 DIAGNOSIS — D62 Acute posthemorrhagic anemia: Secondary | ICD-10-CM

## 2014-05-27 LAB — CBC WITH DIFFERENTIAL/PLATELET
BASOS PCT: 1.1 % (ref 0.0–3.0)
Basophils Absolute: 0.1 10*3/uL (ref 0.0–0.1)
EOS ABS: 0.2 10*3/uL (ref 0.0–0.7)
EOS PCT: 2.7 % (ref 0.0–5.0)
HCT: 26.9 % — ABNORMAL LOW (ref 36.0–46.0)
Hemoglobin: 8.4 g/dL — ABNORMAL LOW (ref 12.0–15.0)
LYMPHS ABS: 1.8 10*3/uL (ref 0.7–4.0)
Lymphocytes Relative: 32.6 % (ref 12.0–46.0)
MCHC: 31.1 g/dL (ref 30.0–36.0)
MCV: 65.3 fl — ABNORMAL LOW (ref 78.0–100.0)
MONOS PCT: 7.6 % (ref 3.0–12.0)
Monocytes Absolute: 0.4 10*3/uL (ref 0.1–1.0)
Neutro Abs: 3.2 10*3/uL (ref 1.4–7.7)
Neutrophils Relative %: 56 % (ref 43.0–77.0)
Platelets: 396 10*3/uL (ref 150.0–400.0)
RBC: 4.12 Mil/uL (ref 3.87–5.11)
RDW: 19.4 % — ABNORMAL HIGH (ref 11.5–15.5)
WBC: 5.6 10*3/uL (ref 4.0–10.5)

## 2014-05-27 MED ORDER — MOVIPREP 100 G PO SOLR: 1.0000 | Freq: Once | ORAL | Status: DC

## 2014-05-27 NOTE — Progress Notes (Addendum)
HPI : Patient is a 64 year old female referred for evaluation of anemia. She has a history of anemia several years ago worked up in [employer]. She recalls being told her stomach look like it had been" scratched by a cat". Her last colonoscopy was done in [place] in 2007, normal per patient.   She began seeing some black stools at home a few weeks ago though none of the last week. She started daily iron several days ago but black stools preceded that. Mild constipation on iron, otherwise BMs normal. In January of last year her hemoglobin was 11.5, it was 8.2 the end of last month. MCV is low at 65. She has no abdominal pain, no nausea. No NSAID use. She is on a daily PPI.    Past Medical History  Diagnosis Date  . Complication of anesthesia     slow to awaken  . Asthma     albuterol-rescue inhaler-uses 1-2 x year  . GERD (gastroesophageal reflux disease)     no meds    Family History  Problem Relation Age of Onset  . Diabetes Father   . Lung cancer Sister 18  . Heart disease Maternal Grandmother   . Heart disease Maternal Grandfather   . Kidney disease Maternal Grandfather   . Colon cancer Neg Hx   . Colon polyps Neg Hx   . Esophageal cancer Neg Hx   . Gallbladder disease Neg Hx    History  Substance Use Topics  . Smoking status: Never Smoker   . Smokeless tobacco: Never Used  . Alcohol Use: 0.0 oz/week    0 Not specified per week     Comment: Rarely   Current Outpatient Prescriptions  Medication Sig Dispense Refill  . albuterol (PROVENTIL HFA;VENTOLIN HFA) 108 (90 BASE) MCG/ACT inhaler Inhale 2 puffs into the lungs every 6 (six) hours as needed for wheezing or shortness of breath. 1 Inhaler 3  . CHERATUSSIN AC 100-10 MG/5ML syrup Take by mouth as needed.   0  . omeprazole (PRILOSEC) 40 MG capsule Take 1 capsule (40 mg total) by mouth daily. 90 capsule 1   No current facility-administered medications for this visit.   No Known Allergies   Review of Systems: Positive  for allergies, cough, fatigue, and fever  All other systems reviewed and negative except where noted in HPI.   Physical Exam: BP 118/72 mmHg  Pulse 80  Ht 5' 2.25" (1.581 m)  Wt 174 lb 6 oz (79.096 kg)  BMI 31.64 kg/m2 Constitutional: Pleasant,well-developed, white female in no acute distress. HEENT: Normocephalic and atraumatic. Conjunctivae are normal. No scleral icterus. Neck supple.  Cardiovascular: Normal rate, regular rhythm.  Pulmonary/chest: Effort normal and breath sounds normal. No wheezing, rales or rhonchi. Abdominal: Soft, nondistended, nontender. Bowel sounds active throughout. There are no masses palpable. No hepatomegaly. Rectal :heme negative stool Extremities: no edema Lymphadenopathy: No cervical adenopathy noted. Neurological: Alert and oriented to person place and time. Skin: Skin is warm and dry. No rashes noted. Psychiatric: Normal mood and affect. Behavior is normal.   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  Patient reports history of anemia for which she underwent an EGD in  2010 at an outside facility, records requested.   She reports a normal colonoscopy in [place] in 2009. For further evaluation of anemia with proceed EGD (with possible small bowel biopsies) and colonoscopy. The risks, benefits, and alternatives to EGD and colonoscopy with possible biopsy and possible polypectomy were discussed  with the patient and she consents to proceed. Recheck CBC today, she may need oral iron and/or IV iron infusion. 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   Patient seen, examined, and I agree with the above documentation, including the assessment and plan. If endoscopic evaluation negative needs hematology referral   Addendum 06/20/14: I received records from [facility] EGD 06/27/08 by Dr. Lyda Jester for anemia. 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

## 2014-05-27 NOTE — Patient Instructions (Signed)
Your physician has requested that you go to the basement for the following lab work before leaving today:  Angel Irwin have been scheduled for an endoscopy and colonoscopy. Please follow the written instructions given to you at your visit today. Please pick up your prep at the pharmacy within the next 1-3 days. If you use inhalers (even only as needed), please bring them with you on the day of your procedure. Your physician has requested that you go to www.startemmi.com and enter the access code given to you at your visit today. This web site gives a general overview about your procedure. However, you should still follow specific instructions given to you by our office regarding your preparation for the procedure.

## 2014-06-04 ENCOUNTER — Other Ambulatory Visit: Payer: BLUE CROSS/BLUE SHIELD

## 2014-06-13 ENCOUNTER — Ambulatory Visit (AMBULATORY_SURGERY_CENTER): Payer: BLUE CROSS/BLUE SHIELD | Admitting: Internal Medicine

## 2014-06-13 ENCOUNTER — Encounter: Payer: Self-pay | Admitting: Internal Medicine

## 2014-06-13 ENCOUNTER — Other Ambulatory Visit (INDEPENDENT_AMBULATORY_CARE_PROVIDER_SITE_OTHER): Payer: BLUE CROSS/BLUE SHIELD

## 2014-06-13 VITALS — BP 127/74 | HR 70 | Temp 97.9°F | Resp 19 | Ht 62.5 in | Wt 174.0 lb

## 2014-06-13 DIAGNOSIS — K449 Diaphragmatic hernia without obstruction or gangrene: Secondary | ICD-10-CM

## 2014-06-13 DIAGNOSIS — D124 Benign neoplasm of descending colon: Secondary | ICD-10-CM

## 2014-06-13 DIAGNOSIS — D509 Iron deficiency anemia, unspecified: Secondary | ICD-10-CM

## 2014-06-13 DIAGNOSIS — K253 Acute gastric ulcer without hemorrhage or perforation: Secondary | ICD-10-CM

## 2014-06-13 HISTORY — PX: COLONOSCOPY: SHX174

## 2014-06-13 HISTORY — PX: UPPER GASTROINTESTINAL ENDOSCOPY: SHX188

## 2014-06-13 LAB — IBC PANEL
Iron: 10 ug/dL — ABNORMAL LOW (ref 42–145)
SATURATION RATIOS: 2 % — AB (ref 20.0–50.0)
TRANSFERRIN: 356 mg/dL (ref 212.0–360.0)

## 2014-06-13 MED ORDER — SODIUM CHLORIDE 0.9 % IV SOLN: 500.0000 mL | INTRAVENOUS | Status: DC

## 2014-06-13 NOTE — Op Note (Signed)
[place]  [address]. [place], [id number]   COLONOSCOPY PROCEDURE REPORT  PATIENT: Angel, Irwin  MR#: [id number] BIRTHDATE: 06/20/1950 , 18  yrs. old GENDER: female ENDOSCOPIST: Jerene Bears, MD REFERRED PF:[id number] Megan Salon, FNP-BC PROCEDURE DATE:  06/13/2014 PROCEDURE:   Colonoscopy with cold biopsy polypectomy First Screening Colonoscopy - Avg.  risk and is 50 yrs.  old or older - No.  Prior Negative Screening - Now for repeat screening. N/A  History of Adenoma - Now for follow-up colonoscopy & has been > or = to 3 yrs.  N/A  Polyps Removed Today? Yes. ASA CLASS:   Class III INDICATIONS:iron deficiency anemia.   previously colonoscopy done in [place] in 2007 and reportedly normal MEDICATIONS: Monitored anesthesia care, Propofol 200 mg IV, and Residual sedation present  DESCRIPTION OF PROCEDURE:   After the risks benefits and alternatives of the procedure were thoroughly explained, informed consent was obtained.  The digital rectal exam revealed no rectal mass.   The LB [id number]  endoscope was introduced through the anus and advanced to the cecum, which was identified by both the appendix and ileocecal valve. No adverse events experienced. The quality of the prep was good, using MoviPrep  The instrument was then slowly withdrawn as the colon was fully examined.  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   A sessile polyp measuring 4 mm in size was found in the descending colon.  A polypectomy was performed with cold forceps.  The resection was complete, the polyp tissue was completely retrieved and sent to histology.   There was moderate diverticulosis noted in the descending colon and sigmoid colon.  Retroflexed views revealed small internal hemorrhoids. The time to cecum=4 minutes 58 seconds. Withdrawal time=11 minutes 48 seconds.  The scope was withdrawn and the procedure  completed. COMPLICATIONS: There were no immediate complications.  ENDOSCOPIC IMPRESSION: 1.   Melanosis coli was found throughout the entire examined colon 2.   Sessile polyp was found in the descending colon; polypectomy was performed with cold forceps 3.   Moderate diverticulosis was noted in the descending colon and sigmoid colon  RECOMMENDATIONS: 1.  Await pathology results 2.  High fiber diet 3.  If the polyp removed today is proven to be an adenomatous (pre-cancerous) polyp, you will need a repeat colonoscopy in 5 years.  Otherwise you should continue to follow colorectal cancer screening guidelines for "routine risk" patients with colonoscopy in 10 years.  You will receive a letter within 1-2 weeks with the results of your biopsy as well as final recommendations.  Please call my office if you have not received a letter after 3 weeks. 4.  Check iron studies (IBC and ferritin) 5.  Continue oral iron supplementation, but would refer to hematology for consideration of IV iron 6.  Video capsule endoscopy for completion of evaluation for iron deficiency anemia, though Cameron's erosions at risk for bleeding could be potential source of intermittent iron deficiency anemia which has been an issue over the last several years.  eSigned:  Jerene Bears, MD 06/13/2014 4:20 PM   cc: the patient, Roxy Cedar, NP, Tye Savoy, NP   PATIENT NAME:  Angel, Irwin MR#: [id number]

## 2014-06-13 NOTE — Progress Notes (Signed)
Report to PACU, RN, vss, BBS= Clear.  

## 2014-06-13 NOTE — Op Note (Signed)
[place]  [address]. [place], [id number]   ENDOSCOPY PROCEDURE REPORT  PATIENT: [name], [name]  MR#: [id number] BIRTHDATE: 1950/07/14 , 46  yrs. old GENDER: female ENDOSCOPIST: Jerene Bears, MD REFERRED BY:  Roxy Cedar, FNP-BC PROCEDURE DATE:  06/13/2014 PROCEDURE:  EGD w/ biopsy ASA CLASS:     Class III INDICATIONS:  iron deficiency anemia. MEDICATIONS: Monitored anesthesia care and Propofol 200 mg IV TOPICAL ANESTHETIC: none  DESCRIPTION OF PROCEDURE: After the risks benefits and alternatives of the procedure were thoroughly explained, informed consent was obtained.  The LB [id number] endoscope was introduced through the mouth and advanced to the second portion of the duodenum , Without limitations.  The instrument was slowly withdrawn as the mucosa was fully examined.   ESOPHAGUS: The mucosa of the esophagus appeared normal.   The esophagus is foreshortened by very large hiatal hernia  STOMACH: A large, approximate 8 cm, hiatal hernia was noted.   A small non-bleeding Cameron's erosion was found in the proximal gastric body at the diaphragmatic hiatus.   The stomach otherwise appeared normal.  DUODENUM: The duodenal mucosa showed no abnormalities in the bulb and 2nd part of the duodenum.  Cold forcep biopsies were taken in the second portion to rule out celiac disease.  Retroflexed views revealed a large hiatal hernia as previously described.     The scope was then withdrawn from the patient and the procedure completed.  COMPLICATIONS: There were no immediate complications.    ENDOSCOPIC IMPRESSION: 1.   The mucosa of the esophagus appeared normal 2.   Large hiatal hernia 3.   Small Cameron's erosion was found in the proximal stomach at the diaphragmatic hiatus 4.   The stomach otherwise appeared normal 5.   The duodenal mucosa showed no abnormalities in the bulb and 2nd part of the duodenum cold forcep biopsies were taken  in the second portion  RECOMMENDATIONS: 1.  Await biopsy results 2.  Continue taking your PPI (omeprazole) once daily.  It is best to be taken 20-30 minutes prior to breakfast meal. 3.  Proceed with a Colonoscopy.   eSigned:  Jerene Bears, MD 06/13/2014 4:14 PM    OV:ZCHYIFOY, Abby Potash MD and The Patient  PATIENT NAME:  Angel Irwin, Angel Irwin MR#: [id number]

## 2014-06-13 NOTE — Progress Notes (Signed)
Called to room to assist during endoscopic procedure.  Patient ID and intended procedure confirmed with present staff. Received instructions for my participation in the procedure from the performing physician.  

## 2014-06-13 NOTE — Progress Notes (Signed)
Temp taken =97.4 by s Tellis RN since pt had chills

## 2014-06-13 NOTE — Patient Instructions (Addendum)
YOU HAD AN ENDOSCOPIC PROCEDURE TODAY AT THE [facility]: Refer to the procedure report that was given to you for any specific questions about what was found during the examination.  If the procedure report does not answer your questions, please call your gastroenterologist to clarify.  If you requested that your care partner not be given the details of your procedure findings, then the procedure report has been included in a sealed envelope for you to review at your convenience later.  YOU SHOULD EXPECT: Some feelings of bloating in the abdomen. Passage of more gas than usual.  Walking can help get rid of the air that was put into your GI tract during the procedure and reduce the bloating. If you had a lower endoscopy (such as a colonoscopy or flexible sigmoidoscopy) you may notice spotting of blood in your stool or on the toilet paper. If you underwent a bowel prep for your procedure, then you may not have a normal bowel movement for a few days.  DIET: Your first meal following the procedure should be a light meal and then it is ok to progress to your normal diet.  A half-sandwich or bowl of soup is an example of a good first meal.  Heavy or fried foods are harder to digest and may make you feel nauseous or bloated.  Likewise meals heavy in dairy and vegetables can cause extra gas to form and this can also increase the bloating.  Drink plenty of fluids but you should avoid alcoholic beverages for 24 hours.  ACTIVITY: Your care partner should take you home directly after the procedure.  You should plan to take it easy, moving slowly for the rest of the day.  You can resume normal activity the day after the procedure however you should NOT DRIVE or use heavy machinery for 24 hours (because of the sedation medicines used during the test).    SYMPTOMS TO REPORT IMMEDIATELY: A gastroenterologist can be reached at any hour.  During normal business hours, 8:30 AM to 5:00 PM Monday through Friday,  call [phone].  After hours and on weekends, please call the GI answering service at [phone] who will take a message and have the physician on call contact you.   Following lower endoscopy (colonoscopy or flexible sigmoidoscopy):  Excessive amounts of blood in the stool  Significant tenderness or worsening of abdominal pains  Swelling of the abdomen that is new, acute  Fever of 100F or higher  Following upper endoscopy (EGD)  Vomiting of blood or coffee ground material  New chest pain or pain under the shoulder blades  Painful or persistently difficult swallowing  New shortness of breath  Fever of 100F or higher  Black, tarry-looking stools  FOLLOW UP: If any biopsies were taken you will be contacted by phone or by letter within the next 1-3 weeks.  Call your gastroenterologist if you have not heard about the biopsies in 3 weeks.  Our staff will call the home number listed on your records the next business day following your procedure to check on you and address any questions or concerns that you may have at that time regarding the information given to you following your procedure. This is a courtesy call and so if there is no answer at the home number and we have not heard from you through the emergency physician on call, we will assume that you have returned to your regular daily activities without incident.  SIGNATURES/CONFIDENTIALITY: You and/or your care   partner have signed paperwork which will be entered into your electronic medical record.  These signatures attest to the fact that that the information above on your After Visit Summary has been reviewed and is understood.  Full responsibility of the confidentiality of this discharge information lies with you and/or your care-partner.   Information on polyps,diverticulosis,& high fiber diet,and hiatal hernia given to you today  Continue Iron supplements  Will refer to hematology for consideration of IV Iron    Continue reflux medication daily  Labs to be done in lab prior to d/c today  Video capsule endoscopy to be set up by Dr. Vena Rua nurse and she will be calling you with details

## 2014-06-14 ENCOUNTER — Telehealth: Payer: Self-pay | Admitting: *Deleted

## 2014-06-14 LAB — FERRITIN: Ferritin: 3.7 ng/mL — ABNORMAL LOW (ref 10.0–291.0)

## 2014-06-14 NOTE — Telephone Encounter (Signed)
  Follow up Call-  Call back number 06/13/2014  Post procedure Call Back phone  # [phone]  Permission to leave phone message Yes     Patient questions:  Do you have a fever, pain , or abdominal swelling? No. Pain Score  0 *  Have you tolerated food without any problems? Yes.    Have you been able to return to your normal activities? Yes.    Do you have any questions about your discharge instructions: Diet   No. Medications  No. Follow up visit  No.  Do you have questions or concerns about your Care? No.  Actions: * If pain score is 4 or above: No action needed, pain <4.

## 2014-06-19 ENCOUNTER — Encounter: Payer: Self-pay | Admitting: Nurse Practitioner

## 2014-06-19 ENCOUNTER — Telehealth: Payer: Self-pay | Admitting: Internal Medicine

## 2014-06-19 ENCOUNTER — Other Ambulatory Visit: Payer: Self-pay

## 2014-06-19 ENCOUNTER — Encounter: Payer: Self-pay | Admitting: Internal Medicine

## 2014-06-19 DIAGNOSIS — D509 Iron deficiency anemia, unspecified: Secondary | ICD-10-CM

## 2014-06-19 NOTE — Telephone Encounter (Signed)
pt confirmed appt for 07/23/14 at 11 w/Mohamed

## 2014-06-19 NOTE — Telephone Encounter (Signed)
Left Message - regarding new pt referral appt QK:SKSHNG, iron deficiency Referral [facility] office

## 2014-06-25 ENCOUNTER — Telehealth: Payer: Self-pay | Admitting: *Deleted

## 2014-06-25 NOTE — Telephone Encounter (Signed)
Patient arrived for small bowel capsule endoscopy teaching. Verbal and written instructions given. Patient verbalized understanding.

## 2014-06-27 ENCOUNTER — Ambulatory Visit (INDEPENDENT_AMBULATORY_CARE_PROVIDER_SITE_OTHER): Payer: BLUE CROSS/BLUE SHIELD | Admitting: Internal Medicine

## 2014-06-27 DIAGNOSIS — D509 Iron deficiency anemia, unspecified: Secondary | ICD-10-CM

## 2014-06-27 DIAGNOSIS — D62 Acute posthemorrhagic anemia: Secondary | ICD-10-CM

## 2014-06-27 NOTE — Progress Notes (Signed)
Patient here for capsule endoscopy. Tolerated procedure. Verbalizes understanding of written and verbal instructions. Lot [id number] exp 2017-05

## 2014-07-08 ENCOUNTER — Other Ambulatory Visit: Payer: Self-pay | Admitting: Family

## 2014-07-08 ENCOUNTER — Ambulatory Visit
Admission: RE | Admit: 2014-07-08 | Discharge: 2014-07-08 | Disposition: A | Discharge status: Home / Self Care | Payer: BLUE CROSS/BLUE SHIELD | Source: Ambulatory Visit | Attending: Family | Admitting: Family

## 2014-07-08 DIAGNOSIS — Z1231 Encounter for screening mammogram for malignant neoplasm of breast: Secondary | ICD-10-CM

## 2014-07-11 ENCOUNTER — Telehealth: Payer: Self-pay

## 2014-07-11 NOTE — Telephone Encounter (Signed)
Pt aware of capsule endo results and aware of her follow-up OV appt with Dr. Hilarie Fredrickson.

## 2014-07-22 ENCOUNTER — Other Ambulatory Visit: Payer: BLUE CROSS/BLUE SHIELD

## 2014-07-23 ENCOUNTER — Telehealth: Payer: Self-pay | Admitting: Internal Medicine

## 2014-07-23 ENCOUNTER — Other Ambulatory Visit: Payer: Self-pay | Admitting: Internal Medicine

## 2014-07-23 ENCOUNTER — Encounter: Payer: Self-pay | Admitting: Internal Medicine

## 2014-07-23 ENCOUNTER — Ambulatory Visit (HOSPITAL_BASED_OUTPATIENT_CLINIC_OR_DEPARTMENT_OTHER): Payer: BLUE CROSS/BLUE SHIELD | Admitting: Internal Medicine

## 2014-07-23 ENCOUNTER — Ambulatory Visit: Payer: BLUE CROSS/BLUE SHIELD

## 2014-07-23 ENCOUNTER — Other Ambulatory Visit (HOSPITAL_BASED_OUTPATIENT_CLINIC_OR_DEPARTMENT_OTHER): Payer: BLUE CROSS/BLUE SHIELD

## 2014-07-23 VITALS — BP 132/59 | HR 59 | Temp 97.5°F | Resp 18 | Ht 62.0 in | Wt 175.9 lb

## 2014-07-23 DIAGNOSIS — K922 Gastrointestinal hemorrhage, unspecified: Secondary | ICD-10-CM

## 2014-07-23 DIAGNOSIS — D509 Iron deficiency anemia, unspecified: Secondary | ICD-10-CM

## 2014-07-23 DIAGNOSIS — D500 Iron deficiency anemia secondary to blood loss (chronic): Secondary | ICD-10-CM

## 2014-07-23 HISTORY — DX: Iron deficiency anemia secondary to blood loss (chronic): D50.0

## 2014-07-23 LAB — CBC & DIFF AND RETIC
BASO%: 0.6 % (ref 0.0–2.0)
Basophils Absolute: 0 10*3/uL (ref 0.0–0.1)
EOS%: 2.4 % (ref 0.0–7.0)
Eosinophils Absolute: 0.2 10*3/uL (ref 0.0–0.5)
HCT: 31.2 % — ABNORMAL LOW (ref 34.8–46.6)
HGB: 8.8 g/dL — ABNORMAL LOW (ref 11.6–15.9)
Immature Retic Fract: 14.1 % — ABNORMAL HIGH (ref 1.60–10.00)
LYMPH#: 2 10*3/uL (ref 0.9–3.3)
LYMPH%: 30.3 % (ref 14.0–49.7)
MCH: 20 pg — ABNORMAL LOW (ref 25.1–34.0)
MCHC: 28.2 g/dL — ABNORMAL LOW (ref 31.5–36.0)
MCV: 70.9 fL — ABNORMAL LOW (ref 79.5–101.0)
MONO#: 0.5 10*3/uL (ref 0.1–0.9)
MONO%: 6.9 % (ref 0.0–14.0)
NEUT%: 59.8 % (ref 38.4–76.8)
NEUTROS ABS: 4 10*3/uL (ref 1.5–6.5)
PLATELETS: 323 10*3/uL (ref 145–400)
RBC: 4.4 10*6/uL (ref 3.70–5.45)
RDW: 21.2 % — ABNORMAL HIGH (ref 11.2–14.5)
RETIC %: 2.5 % — AB (ref 0.70–2.10)
Retic Ct Abs: 110 10*3/uL — ABNORMAL HIGH (ref 33.70–90.70)
WBC: 6.7 10*3/uL (ref 3.9–10.3)

## 2014-07-23 LAB — COMPREHENSIVE METABOLIC PANEL (CC13)
ALT: 14 U/L (ref 0–55)
AST: 18 U/L (ref 5–34)
Albumin: 3.7 g/dL (ref 3.5–5.0)
Alkaline Phosphatase: 107 U/L (ref 40–150)
Anion Gap: 10 mEq/L (ref 3–11)
BILIRUBIN TOTAL: 0.39 mg/dL (ref 0.20–1.20)
BUN: 13.8 mg/dL (ref 7.0–26.0)
CO2: 20 mEq/L — ABNORMAL LOW (ref 22–29)
CREATININE: 0.8 mg/dL (ref 0.6–1.1)
Calcium: 9.5 mg/dL (ref 8.4–10.4)
Chloride: 108 mEq/L (ref 98–109)
EGFR: 82 mL/min/{1.73_m2} — ABNORMAL LOW (ref >90)
Glucose: 93 mg/dl (ref 70–140)
Potassium: 4.2 mEq/L (ref 3.5–5.1)
Sodium: 137 mEq/L (ref 136–145)
Total Protein: 7.4 g/dL (ref 6.4–8.3)

## 2014-07-23 LAB — IRON AND TIBC CHCC
%SAT: 4 % — AB (ref 21–57)
IRON: 18 ug/dL — AB (ref 41–142)
TIBC: 464 ug/dL — AB (ref 236–444)
UIBC: 446 ug/dL — ABNORMAL HIGH (ref 120–384)

## 2014-07-23 LAB — FERRITIN CHCC: Ferritin: 8 ng/ml — ABNORMAL LOW (ref 9–269)

## 2014-07-23 LAB — LACTATE DEHYDROGENASE (CC13): LDH: 130 U/L (ref 125–245)

## 2014-07-23 NOTE — Telephone Encounter (Signed)
gave and printed appt sched and avs for pt for April, May and June....sed added tx.

## 2014-07-23 NOTE — Progress Notes (Signed)
Checked in new pt with no financial concerns at this time.  Pt is here for a hematology concern so financial assistance may not be needed but she has my card for any billing questions or concerns.

## 2014-07-23 NOTE — Progress Notes (Signed)
[place] [phone]   [phone]  CONSULT NOTE  REFERRING PHYSICIAN: Dr. Zenovia Jarred.  REASON FOR CONSULTATION:  64 years old white female with microcytic anemia  HPI Angel Irwin is a 64 y.o. female with past medical history significant for chronic asthma as well as GERD and history of gastric Cameron erosion. The patient mentions that he has anemia for several years at least since 2010. She was found on previous gastrointestinal workup to have Cameron erosions. She had a recent upper endoscopy and colonoscopy by Dr. Hilarie Fredrickson on 06/09/2014 and the colonoscopy showed sessile polyps in the descending colon and these were removed. The upper endoscopy showed a small Cameron's erosion in the proximal stomach at the diaphragmatic hiatus. Capsule endoscopy was also performed and showed similar findings in the small intestine. The patient was started on treatment with over-the-counter iron tablets 2 tablets by mouth daily but usually has upset stomach and constipation. Her CBC on 05/14/2014 showed hemoglobin of 8.2 and hematocrit 26.7% with MCV of 65.3. Repeat CBC on 05/27/2014 showed hemoglobin of 8.4 and hematocrit 26.9%. Ferritin was 3.7, serum iron 10, iron saturation 2%. The patient was referred to me today for further evaluation and recommendation regarding treatment of her condition. When seen today she continues to complain of fatigue and weakness as well as lack of stamina most of the time. She had a lot of craving for ice. She denied having any chest pain but has shortness of breath and tachycardia with minimal exertion. She denied having any significant weight loss or night sweats. She has no nausea or vomiting. Family history significant for father with diabetes mellitus, mother was killed at age 64 and sister had lung cancer at age 62. The patient is married and has 4 children. She works at a Therapist, art for the [employer] She  has no history of smoking, alcohol or drug abuse. HPI  Past Medical History  Diagnosis Date  . Complication of anesthesia     slow to awaken  . Asthma     albuterol-rescue inhaler-uses 1-2 x year  . GERD (gastroesophageal reflux disease)     no meds    Past Surgical History  Procedure Laterality Date  . Dilation and curettage of uterus  1980  . Colonoscopy    . Wisdom tooth extraction    . Vaginal hysterectomy  02/22/2011    Procedure: HYSTERECTOMY VAGINAL;  Surgeon: Delice Lesch, MD;  Location: [facility];  Service: Gynecology;  Laterality: N/A;  . Anterior and posterior repair  02/22/2011    Procedure: ANTERIOR (CYSTOCELE) AND POSTERIOR REPAIR (RECTOCELE);  Surgeon: Delice Lesch, MD;  Location: [facility];  Service: Gynecology;  Laterality: N/A;  . Cystoscopy  02/22/2011    Procedure: CYSTOSCOPY;  Surgeon: Delice Lesch, MD;  Location: [facility];  Service: Gynecology;  Laterality: N/A;  . Salpingoophorectomy  02/22/2011    Procedure: SALPINGO OOPHERECTOMY;  Surgeon: Delice Lesch, MD;  Location: [facility];  Service: Gynecology;  Laterality: Bilateral;    Family History  Problem Relation Age of Onset  . Diabetes Father   . Lung cancer Sister 41  . Heart disease Maternal Grandmother   . Heart disease Maternal Grandfather   . Kidney disease Maternal Grandfather   . Colon cancer Neg Hx   . Colon polyps Neg Hx   . Esophageal cancer Neg Hx   . Gallbladder disease Neg Hx     Social History History  Substance Use Topics  .  Smoking status: Never Smoker   . Smokeless tobacco: Never Used  . Alcohol Use: 0.0 oz/week    0 Standard drinks or equivalent per week     Comment: Rarely    No Known Allergies  Current Outpatient Prescriptions  Medication Sig Dispense Refill  . albuterol (PROVENTIL HFA;VENTOLIN HFA) 108 (90 BASE) MCG/ACT inhaler Inhale 2 puffs into the lungs every 6 (six) hours as needed for wheezing or shortness of breath. 1 Inhaler 3  . CHERATUSSIN AC 100-10 MG/5ML  syrup Take by mouth as needed.   0  . ferrous sulfate 325 (65 FE) MG tablet Take 325 mg by mouth daily with breakfast.    . omeprazole (PRILOSEC) 40 MG capsule Take 1 capsule (40 mg total) by mouth daily. 90 capsule 1   No current facility-administered medications for this visit.    Review of Systems  Constitutional: positive for fatigue Eyes: negative Ears, nose, mouth, throat, and face: negative Respiratory: positive for dyspnea on exertion Cardiovascular: positive for dyspnea and tachypnea Gastrointestinal: negative Genitourinary:negative Integument/breast: negative Hematologic/lymphatic: negative Musculoskeletal:negative Neurological: negative Behavioral/Psych: negative Endocrine: negative Allergic/Immunologic: negative  Physical Exam  HFW:YOVZC, healthy, no distress, well nourished and well developed SKIN: skin color, texture, turgor are normal, no rashes or significant lesions HEAD: Normocephalic, No masses, lesions, tenderness or abnormalities EYES: normal, PERRLA, Conjunctiva are pink and non-injected EARS: External ears normal, Canals clear OROPHARYNX:no exudate, no erythema and lips, buccal mucosa, and tongue normal  NECK: supple, no adenopathy, no JVD LYMPH:  no palpable lymphadenopathy, no hepatosplenomegaly BREAST:not examined LUNGS: clear to auscultation , and palpation HEART: no murmurs and no gallops ABDOMEN:abdomen soft, non-tender and obese BACK: Back symmetric, no curvature., No CVA tenderness EXTREMITIES:no joint deformities, effusion, or inflammation, no edema, no skin discoloration  NEURO: alert & oriented x 3 with fluent speech, no focal motor/sensory deficits  PERFORMANCE STATUS: ECOG 1  LABORATORY DATA: Lab Results  Component Value Date   WBC 6.7 07/23/2014   HGB 8.8* 07/23/2014   HCT 31.2* 07/23/2014   MCV 70.9* 07/23/2014   PLT 323 07/23/2014      Chemistry      Component Value Date/Time   NA 137 07/23/2014 1119   NA 138 05/14/2014  0858   K 4.2 07/23/2014 1119   K 4.1 05/14/2014 0858   CL 107 05/14/2014 0858   CO2 20* 07/23/2014 1119   CO2 23 05/14/2014 0858   BUN 13.8 07/23/2014 1119   BUN 16 05/14/2014 0858   CREATININE 0.8 07/23/2014 1119   CREATININE 0.68 05/14/2014 0858      Component Value Date/Time   CALCIUM 9.5 07/23/2014 1119   CALCIUM 9.4 05/14/2014 0858   ALKPHOS 107 07/23/2014 1119   ALKPHOS 107 05/14/2014 0858   AST 18 07/23/2014 1119   AST 19 05/14/2014 0858   ALT 14 07/23/2014 1119   ALT 15 05/14/2014 0858   BILITOT 0.39 07/23/2014 1119   BILITOT 0.5 05/14/2014 0858       RADIOGRAPHIC STUDIES: Mm Digital Screening Bilateral  07/08/2014   CLINICAL DATA:  Screening.  EXAM: DIGITAL SCREENING BILATERAL MAMMOGRAM WITH CAD  COMPARISON:  Previous exam(s).  ACR Breast Density Category c: The breast tissue is heterogeneously dense, which may obscure small masses.  FINDINGS: There are no findings suspicious for malignancy. Images were processed with CAD.  IMPRESSION: No mammographic evidence of malignancy. A result letter of this screening mammogram will be mailed directly to the patient.  RECOMMENDATION: Screening mammogram in one year. (Code:SM-B-01Y)  BI-RADS CATEGORY  1: Negative.   Electronically Signed   By: Altamese Cabal M.D.   On: 07/08/2014 09:22    ASSESSMENT: This is a very pleasant 64 years old white female diagnosed with severe iron deficiency anemia secondary to gastrointestinal blood loss from [facility] erosions. The patient has positive response to the oral iron tablets and she also has upset stomach and constipation.   PLAN: I had a lengthy discussion with the patient today about her current condition and treatment options. She is currently symptomatic from her severe anemia. I recommended for her to proceed with Feraheme infusion 510 mg IV weekly 2 doses. She is expected to start the first dose of this treatment on 07/26/2014. I also advised the patient to continue on some of the  oral iron tablets for maintenance. I will see her back for follow-up visit in 2 months for reevaluation after repeating CBC, iron study and ferritin. The patient was advised to call immediately if she has any concerning symptoms in the interval. The patient voices understanding of current disease status and treatment options and is in agreement with the current care plan.  All questions were answered. The patient knows to call the clinic with any problems, questions or concerns. We can certainly see the patient much sooner if necessary.  Thank you so much for allowing me to participate in the care of Angel Irwin. I will continue to follow up the patient with you and assist in her care.  I spent 40 minutes counseling the patient face to face. The total time spent in the appointment was 60 minutes.  Disclaimer: This note was dictated with voice recognition software. Similar sounding words can inadvertently be transcribed and may not be corrected upon review.   William Schake K. July 23, 2014, 12:24 PM

## 2014-07-25 LAB — VITAMIN B12: Vitamin B-12: 679 pg/mL (ref 211–911)

## 2014-07-25 LAB — PROTEIN ELECTROPHORESIS, SERUM, WITH REFLEX
Albumin ELP: 3.7 g/dL — ABNORMAL LOW (ref 3.8–4.8)
Alpha-1-Globulin: 0.3 g/dL (ref 0.2–0.3)
Alpha-2-Globulin: 0.7 g/dL (ref 0.5–0.9)
Beta 2: 0.5 g/dL (ref 0.2–0.5)
Beta Globulin: 0.6 g/dL (ref 0.4–0.6)
Gamma Globulin: 1.2 g/dL (ref 0.8–1.7)
TOTAL PROTEIN, SERUM ELECTROPHOR: 7.1 g/dL (ref 6.1–8.1)

## 2014-07-25 LAB — FOLATE: Folate: 14.5 ng/mL

## 2014-07-25 LAB — ERYTHROPOIETIN: Erythropoietin: 82.6 m[IU]/mL — ABNORMAL HIGH (ref 2.6–18.5)

## 2014-07-26 ENCOUNTER — Ambulatory Visit (HOSPITAL_BASED_OUTPATIENT_CLINIC_OR_DEPARTMENT_OTHER): Payer: BLUE CROSS/BLUE SHIELD

## 2014-07-26 DIAGNOSIS — D500 Iron deficiency anemia secondary to blood loss (chronic): Secondary | ICD-10-CM

## 2014-07-26 DIAGNOSIS — K922 Gastrointestinal hemorrhage, unspecified: Secondary | ICD-10-CM

## 2014-07-26 MED ORDER — SODIUM CHLORIDE 0.9 % IV SOLN
510.0000 mg | Freq: Once | INTRAVENOUS | Status: AC
  Administered 2014-07-26: 510 mg via INTRAVENOUS
  Filled 2014-07-26: qty 17

## 2014-07-26 MED ORDER — SODIUM CHLORIDE 0.9 % IV SOLN
Freq: Once | INTRAVENOUS | Status: AC
  Administered 2014-07-26: 13:00:00 via INTRAVENOUS

## 2014-07-26 NOTE — Progress Notes (Signed)
Patient tolerated first feraheme without complications.

## 2014-07-26 NOTE — Patient Instructions (Signed)

## 2014-08-01 ENCOUNTER — Encounter: Payer: Self-pay | Admitting: Internal Medicine

## 2014-08-02 ENCOUNTER — Ambulatory Visit (HOSPITAL_BASED_OUTPATIENT_CLINIC_OR_DEPARTMENT_OTHER): Payer: BLUE CROSS/BLUE SHIELD

## 2014-08-02 VITALS — BP 128/74 | HR 72 | Temp 97.9°F | Resp 18

## 2014-08-02 DIAGNOSIS — K922 Gastrointestinal hemorrhage, unspecified: Secondary | ICD-10-CM | POA: Diagnosis not present

## 2014-08-02 DIAGNOSIS — D500 Iron deficiency anemia secondary to blood loss (chronic): Secondary | ICD-10-CM | POA: Diagnosis not present

## 2014-08-02 MED ORDER — SODIUM CHLORIDE 0.9 % IV SOLN
510.0000 mg | Freq: Once | INTRAVENOUS | Status: AC
  Administered 2014-08-02: 510 mg via INTRAVENOUS
  Filled 2014-08-02: qty 17

## 2014-08-02 MED ORDER — SODIUM CHLORIDE 0.9 % IV SOLN
Freq: Once | INTRAVENOUS | Status: AC
  Administered 2014-08-02: 13:00:00 via INTRAVENOUS

## 2014-08-02 NOTE — Patient Instructions (Signed)

## 2014-08-16 ENCOUNTER — Encounter: Payer: Self-pay | Admitting: *Deleted

## 2014-09-04 ENCOUNTER — Ambulatory Visit (INDEPENDENT_AMBULATORY_CARE_PROVIDER_SITE_OTHER): Payer: BLUE CROSS/BLUE SHIELD | Admitting: Internal Medicine

## 2014-09-04 ENCOUNTER — Encounter: Payer: Self-pay | Admitting: Internal Medicine

## 2014-09-04 VITALS — BP 128/72 | HR 68 | Ht 62.25 in | Wt 174.1 lb

## 2014-09-04 DIAGNOSIS — D126 Benign neoplasm of colon, unspecified: Secondary | ICD-10-CM

## 2014-09-04 DIAGNOSIS — K257 Chronic gastric ulcer without hemorrhage or perforation: Secondary | ICD-10-CM | POA: Insufficient documentation

## 2014-09-04 DIAGNOSIS — K449 Diaphragmatic hernia without obstruction or gangrene: Secondary | ICD-10-CM | POA: Insufficient documentation

## 2014-09-04 DIAGNOSIS — D509 Iron deficiency anemia, unspecified: Secondary | ICD-10-CM

## 2014-09-04 HISTORY — DX: Benign neoplasm of colon, unspecified: D12.6

## 2014-09-04 NOTE — Patient Instructions (Signed)
Please follow up with Dr Pyrtle as needed. 

## 2014-09-04 NOTE — Progress Notes (Signed)
Subjective:    Patient ID: Angel Irwin, female    DOB: July 28, 1950, 64 y.o.   MRN: [id number]  HPI Angel Irwin is a 64 year old female with a past medical history of GERD with large hiatal hernia, Cameron's erosion, adenomatous colon polyp and iron deficiency anemia who seen in follow-up. In evaluating her iron deficiency anemia she had upper endoscopy, colonoscopy, and video capsule endoscopy earlier that shear. Endoscopy revealed a large hiatal hernia, small Cameron's erosion at the diaphragmatic hiatus and was otherwise normal. Small bowel biopsies were negative for celiac disease. Colonoscopy revealed melanosis coli and a 4 mm descending colon adenoma which was removed. There was moderate diverticulosis in the left colon. 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 Been seen by Dr. Earlie Server and is receiving IV iron and oral iron.  She reports she's been doing well. She's had significant improvement in fatigue and overall energy level after 2 IV iron infusions. She's noticed last tachycardia with exertion and also less dyspnea on exertion. Stools have been dark green on iron but no melena or rectal bleeding. She stopped omeprazole because this was causing leg cramps at night. She's been watching her diet and avoiding reflux promoting foods. She's been doing well and his lost 3 pounds with diet change. She is using iron sulfate 325 mg daily. This has resulted in constipation but she is using an over-the-counter daily colon cleanse which is working well.   Review of Systems As per history of present illness, otherwise negative  Current Medications, Allergies, Past Medical History, Past Surgical History, Family History and Social History were reviewed in [employer] record.     Objective:   Physical Exam BP 128/72 mmHg  Pulse 68  Ht 5' 2.25" (1.581 m)  Wt 174 lb 2 oz (78.983 kg)  BMI 31.60 kg/m2 Constitutional:  Well-developed and well-nourished. No distress. HEENT: Normocephalic and atraumatic. Oropharynx is clear and moist. No oropharyngeal exudate. Conjunctivae are normal.  No scleral icterus. Neck: Neck supple. Trachea midline. Cardiovascular: Normal rate, regular rhythm and intact distal pulses. No M/R/G Pulmonary/chest: Effort normal and breath sounds normal. No wheezing, rales or rhonchi. Abdominal: Soft, nontender, nondistended. Bowel sounds active throughout. There are no masses palpable. No hepatosplenomegaly. Extremities: no clubbing, cyanosis, or edema Lymphadenopathy: No cervical adenopathy noted. Neurological: Alert and oriented to person place and time. Skin: Skin is warm and dry. No rashes noted. Psychiatric: Normal mood and affect. Behavior is normal.  CBC    Component Value Date/Time   WBC 6.7 07/23/2014 1119   WBC 5.6 05/27/2014 0930   RBC 4.40 07/23/2014 1119   RBC 4.12 05/27/2014 0930   HGB 8.8* 07/23/2014 1119   HGB 8.4 Repeated and verified X2.* 05/27/2014 0930   HCT 31.2* 07/23/2014 1119   HCT 26.9* 05/27/2014 0930   PLT 323 07/23/2014 1119   PLT 396.0 05/27/2014 0930   MCV 70.9* 07/23/2014 1119   MCV 65.3 Repeated and verified X2.* 05/27/2014 0930   MCH 20.0* 07/23/2014 1119   MCH 29.6 02/16/2011 0921   MCHC 28.2* 07/23/2014 1119   MCHC 31.1 05/27/2014 0930   RDW 21.2* 07/23/2014 1119   RDW 19.4* 05/27/2014 0930   LYMPHSABS 2.0 07/23/2014 1119   LYMPHSABS 1.8 05/27/2014 0930   MONOABS 0.5 07/23/2014 1119   MONOABS 0.4 05/27/2014 0930   EOSABS 0.2 07/23/2014 1119   EOSABS 0.2 05/27/2014 0930   BASOSABS 0.0 07/23/2014 1119   BASOSABS 0.1 05/27/2014 0930  Iron/TIBC/Ferritin/ %Sat    Component Value Date/Time   IRON 18* 07/23/2014 1119   IRON 10* 06/13/2014 1646   TIBC 464* 07/23/2014 1119   FERRITIN 8* 07/23/2014 1119   FERRITIN 3.7* 06/13/2014 1646   IRONPCTSAT 4* 07/23/2014 1119   IRONPCTSAT 2.0* 06/13/2014 1646       Assessment & Plan:    64 year old female with a past medical history of GERD with large hiatal hernia, Cameron's erosion, adenomatous colon polyp and iron deficiency anemia who seen in follow-up.  1. IDA/hiatal hernia with Cameron's erosion/isolated small bowel angioectasia -- after thorough GI evaluation her likely source of iron deficiency anemia is Cameron's erosion. With only one angiectasia seen, this is unlikely to be the primary contributor to iron deficiency. We discussed hiatal hernia repair but she would like to avoid this if possible. I think this is reasonable if she is able to maintain normal iron and hemoglobin after appropriate IV iron supplementation. She will follow with hematology periodically for blood counts, monitoring iron stores and IV iron when necessary. She is benefited symptomatically from IV iron already. She's continuing daily oral iron. She can follow-up with me on an as-needed basis. Should overt bleeding occur or inability to maintain iron stores supplementation then further consideration of hiatal hernia repair  2. Adenomatous colon polyp -- repeat colonoscopy in 5 years  25 minutes spent with patient today discussing the above issues

## 2014-09-09 ENCOUNTER — Ambulatory Visit (INDEPENDENT_AMBULATORY_CARE_PROVIDER_SITE_OTHER): Payer: BLUE CROSS/BLUE SHIELD | Admitting: Family Medicine

## 2014-09-09 ENCOUNTER — Encounter: Payer: Self-pay | Admitting: Family Medicine

## 2014-09-09 VITALS — BP 102/68 | HR 87 | Temp 97.8°F | Ht 61.75 in | Wt 175.5 lb

## 2014-09-09 DIAGNOSIS — D500 Iron deficiency anemia secondary to blood loss (chronic): Secondary | ICD-10-CM | POA: Diagnosis not present

## 2014-09-09 DIAGNOSIS — J4520 Mild intermittent asthma, uncomplicated: Secondary | ICD-10-CM | POA: Diagnosis not present

## 2014-09-09 DIAGNOSIS — Z7189 Other specified counseling: Secondary | ICD-10-CM

## 2014-09-09 DIAGNOSIS — Z7689 Persons encountering health services in other specified circumstances: Secondary | ICD-10-CM

## 2014-09-09 DIAGNOSIS — K257 Chronic gastric ulcer without hemorrhage or perforation: Secondary | ICD-10-CM | POA: Diagnosis not present

## 2014-09-09 DIAGNOSIS — K449 Diaphragmatic hernia without obstruction or gangrene: Secondary | ICD-10-CM

## 2014-09-09 MED ORDER — ALBUTEROL SULFATE HFA 108 (90 BASE) MCG/ACT IN AERS: 2.0000 | INHALATION_SPRAY | Freq: Four times a day (QID) | RESPIRATORY_TRACT | Status: DC | PRN

## 2014-09-09 NOTE — Progress Notes (Signed)
Pre visit review using our clinic review tool, if applicable. No additional management support is needed unless otherwise documented below in the visit note. 

## 2014-09-09 NOTE — Progress Notes (Signed)
HPI:  Angel Irwin is here to establish care.   Has the following chronic problems that require follow up and concerns today:  Iron Def Anemia: -2ndary to GI loss from [facility] lesion -seeing hematologist -s/p iron infusions and on oral iron - report improved energy  -s/p extensive GI eval 2016 -denies: syncope, presyncope, dizziness, melena, blood in stools  GERD/Hiatal Hernia/Cameron Lesion: -seeing GI -has large hiatal hernia, considering surgery if iron does not improve -can't take ppi as gives her leg cramps -denies: abd pain, melena, hematochezia  Asthma: -diagnosed as a child -mild, intermittent -trigger is grass in the spring and fall -denies: SOB, wheezing, hospitalization  Obesity: -not much exercise recently due to anemia -she is trying to eat a healthy diet  ROS negative for unless reported above: fevers, unintentional weight loss, hearing or vision loss, chest pain, palpitations, struggling to breath, hemoptysis, melena, hematochezia, hematuria, falls, loc, si, thoughts of self harm  Past Medical History  Diagnosis Date  . Asthma     albuterol-rescue inhaler-uses 1-2 x year  . GERD (gastroesophageal reflux disease)     no meds  . Hiatal hernia   . Fundic gland polyps of stomach, benign   . [name] lesion, chronic     on sm bowel capsule 06/2014  . 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 Adenomatous colon polyp 09/04/2014  . Complication of anesthesia     slow to awaken    Past Surgical History  Procedure Laterality Date  . Dilation and curettage of uterus  1980  . Colonoscopy    . Wisdom tooth extraction    . Vaginal hysterectomy  02/22/2011    Procedure: HYSTERECTOMY VAGINAL;  Surgeon: Delice Lesch, MD;  Location: [facility];  Service: Gynecology;  Laterality: N/A;  . Anterior and posterior repair  02/22/2011    Procedure: ANTERIOR (CYSTOCELE) AND POSTERIOR REPAIR (RECTOCELE);  Surgeon: Delice Lesch, MD;  Location: [facility];  Service: Gynecology;  Laterality: N/A;  . Cystoscopy  02/22/2011    Procedure: CYSTOSCOPY;  Surgeon: Delice Lesch, MD;  Location: [facility];  Service: Gynecology;  Laterality: N/A;  . Salpingoophorectomy  02/22/2011    Procedure: SALPINGO OOPHERECTOMY;  Surgeon: Delice Lesch, MD;  Location: [facility];  Service: Gynecology;  Laterality: Bilateral;    Family History  Problem Relation Age of Onset  . Diabetes Father   . Lung cancer Sister 98  . Heart disease Maternal Grandmother   . Heart disease Maternal Grandfather   . Kidney disease Maternal Grandfather   . Colon cancer Neg Hx   . Colon polyps Neg Hx   . Esophageal cancer Neg Hx   . Gallbladder disease Neg Hx     History   Social History  . Marital Status: Married    Spouse Name: N/A  . Number of Children: 4  . Years of Education: N/A   Occupational History  . [employer]    Social History Main Topics  . Smoking status: Never Smoker   . Smokeless tobacco: Never Used  . Alcohol Use: 0.0 oz/week    0 Standard drinks or equivalent per week     Comment: Rarely  . Drug Use: No  . Sexual Activity: Not on file   Other Topics Concern  . 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  albuterol (PROVENTIL HFA;VENTOLIN HFA) 108 (90 BASE) MCG/ACT inhaler, Inhale 2 puffs into the lungs every 6 (six) hours as needed for wheezing or shortness of breath., Disp: 1 Inhaler, Rfl: 3 .  ferrous sulfate 325 (65 FE) MG tablet, Take 325 mg by mouth daily with breakfast., Disp: , Rfl:   EXAM:  Filed Vitals:   09/09/14 0817  BP: 102/68  Pulse: 87  Temp: 97.8 F (36.6 C)    Body mass index is 32.38 kg/(m^2).  GENERAL: vitals reviewed and listed above, alert,  oriented, appears well hydrated and in no acute distress  HEENT: atraumatic, conjunttiva clear, no obvious abnormalities on inspection of external nose and ears  NECK: no obvious masses on inspection  LUNGS: clear to auscultation bilaterally, no wheezes, rales or rhonchi, good air movement  CV: HRRR, no peripheral edema  MS: moves all extremities without noticeable abnormality  PSYCH: pleasant and cooperative, no obvious depression or anxiety  ASSESSMENT AND PLAN:  Discussed the following assessment and plan:  Asthma, chronic, mild intermittent, uncomplicated -refilled albuterol  Iron deficiency anemia due to chronic blood loss Hiatal hernia Cameron lesion, chronic -continue care with GI and hematology  Encounter to establish care -We reviewed the PMH, PSH, FH, SH, Meds and Allergies. -We provided refills for any medications we will prescribe as needed. -We addressed current concerns per orders and patient instructions. -We have asked for records for pertinent exams, studies, vaccines and notes from previous providers. -We have advised patient to follow up per instructions below.   -Patient advised to return or notify a doctor immediately if symptoms worsen or persist or new concerns arise.  There are no Patient Instructions on file for this visit.   Colin Benton R.

## 2014-09-20 ENCOUNTER — Other Ambulatory Visit (HOSPITAL_BASED_OUTPATIENT_CLINIC_OR_DEPARTMENT_OTHER): Payer: BLUE CROSS/BLUE SHIELD

## 2014-09-20 DIAGNOSIS — D500 Iron deficiency anemia secondary to blood loss (chronic): Secondary | ICD-10-CM

## 2014-09-20 DIAGNOSIS — K922 Gastrointestinal hemorrhage, unspecified: Secondary | ICD-10-CM

## 2014-09-20 LAB — FERRITIN CHCC: Ferritin: 68 ng/ml (ref 9–269)

## 2014-09-20 LAB — CBC WITH DIFFERENTIAL/PLATELET
BASO%: 0.9 % (ref 0.0–2.0)
Basophils Absolute: 0.1 10*3/uL (ref 0.0–0.1)
EOS%: 1.7 % (ref 0.0–7.0)
Eosinophils Absolute: 0.1 10*3/uL (ref 0.0–0.5)
HCT: 38.1 % (ref 34.8–46.6)
HGB: 12.6 g/dL (ref 11.6–15.9)
LYMPH%: 23.1 % (ref 14.0–49.7)
MCH: 27.7 pg (ref 25.1–34.0)
MCHC: 33.1 g/dL (ref 31.5–36.0)
MCV: 83.8 fL (ref 79.5–101.0)
MONO#: 0.5 10*3/uL (ref 0.1–0.9)
MONO%: 8.1 % (ref 0.0–14.0)
NEUT#: 4.4 10*3/uL (ref 1.5–6.5)
NEUT%: 66.2 % (ref 38.4–76.8)
Platelets: 260 10*3/uL (ref 145–400)
RBC: 4.55 10*6/uL (ref 3.70–5.45)
RDW: 27.8 % — ABNORMAL HIGH (ref 11.2–14.5)
WBC: 6.6 10*3/uL (ref 3.9–10.3)
lymph#: 1.5 10*3/uL (ref 0.9–3.3)

## 2014-09-20 LAB — IRON AND TIBC CHCC
%SAT: 11 % — AB (ref 21–57)
IRON: 33 ug/dL — AB (ref 41–142)
TIBC: 290 ug/dL (ref 236–444)
UIBC: 257 ug/dL (ref 120–384)

## 2014-09-25 ENCOUNTER — Ambulatory Visit (HOSPITAL_BASED_OUTPATIENT_CLINIC_OR_DEPARTMENT_OTHER): Payer: BLUE CROSS/BLUE SHIELD | Admitting: Internal Medicine

## 2014-09-25 ENCOUNTER — Telehealth: Payer: Self-pay | Admitting: Internal Medicine

## 2014-09-25 ENCOUNTER — Encounter: Payer: Self-pay | Admitting: Internal Medicine

## 2014-09-25 VITALS — BP 100/71 | HR 82 | Temp 98.0°F | Resp 18 | Ht 61.75 in | Wt 175.2 lb

## 2014-09-25 DIAGNOSIS — D509 Iron deficiency anemia, unspecified: Secondary | ICD-10-CM

## 2014-09-25 DIAGNOSIS — D500 Iron deficiency anemia secondary to blood loss (chronic): Secondary | ICD-10-CM

## 2014-09-25 NOTE — Telephone Encounter (Signed)
called pt pt phone kep hanging up....mailed pt appt sched/letter and avs

## 2014-09-25 NOTE — Progress Notes (Signed)
[facility] Telephone:(336) [phone]   Fax:(336) [phone]  OFFICE PROGRESS NOTE  Lucretia Kern., DO [place] [address]  DIAGNOSIS: Iron deficiency anemia  PRIOR THERAPY: Feraheme infusion 510 MG IV weekly 2 doses last dose was given 08/02/2014.  CURRENT THERAPY: Ferrous sulfate 325 mg by mouth twice a day.  INTERVAL HISTORY: Angel Irwin 64 y.o. female returns to the clinic today for follow-up visit. The patient is feeling much better after she received treatment with Feraheme infusion, last dose was given almost 2 months ago. She also continues on treatment with ferrous sulfate over-the-counter formulation 1-2 tablets a day. She denied having any significant fatigue or weakness. The patient denied having any significant chest pain, shortness of breath, cough or hemoptysis. She had repeat CBC and iron study performed earlier today and she is here for evaluation and discussion of her lab results.  MEDICAL HISTORY: Past Medical History  Diagnosis Date  . Asthma     albuterol-rescue inhaler-uses 1-2 x year  . GERD (gastroesophageal reflux disease)     no meds  . Hiatal hernia   . Fundic gland polyps of stomach, benign   . [name] lesion, chronic     on sm bowel capsule 06/2014  . 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 Adenomatous colon polyp 09/04/2014  . Complication of anesthesia     slow to awaken    ALLERGIES:  has No Known Allergies.  MEDICATIONS:  Current Outpatient Prescriptions  Medication Sig Dispense Refill  . ferrous sulfate 325 (65 FE) MG tablet Take 325 mg by mouth daily with breakfast.    . albuterol (PROVENTIL HFA;VENTOLIN HFA) 108 (90 BASE) MCG/ACT inhaler Inhale 2 puffs into the lungs every 6 (six) hours as needed for wheezing or shortness of breath. (Patient not taking: Reported on 09/25/2014) 1 Inhaler 3   No current facility-administered medications for this visit.     SURGICAL HISTORY:  Past Surgical History  Procedure Laterality Date  . Dilation and curettage of uterus  1980  . Colonoscopy    . Wisdom tooth extraction    . Vaginal hysterectomy  02/22/2011    Procedure: HYSTERECTOMY VAGINAL;  Surgeon: Delice Lesch, MD;  Location: [facility];  Service: Gynecology;  Laterality: N/A;  . Anterior and posterior repair  02/22/2011    Procedure: ANTERIOR (CYSTOCELE) AND POSTERIOR REPAIR (RECTOCELE);  Surgeon: Delice Lesch, MD;  Location: [facility];  Service: Gynecology;  Laterality: N/A;  . Cystoscopy  02/22/2011    Procedure: CYSTOSCOPY;  Surgeon: Delice Lesch, MD;  Location: [facility];  Service: Gynecology;  Laterality: N/A;  . Salpingoophorectomy  02/22/2011    Procedure: SALPINGO OOPHERECTOMY;  Surgeon: Delice Lesch, MD;  Location: [facility];  Service: Gynecology;  Laterality: Bilateral;    REVIEW OF SYSTEMS:  A comprehensive review of systems was negative.   PHYSICAL EXAMINATION: General appearance: alert, cooperative and no distress Head: Normocephalic, without obvious abnormality, atraumatic Neck: no adenopathy, no JVD, supple, symmetrical, trachea midline and thyroid not enlarged, symmetric, no tenderness/mass/nodules Lymph nodes: Cervical, supraclavicular, and axillary nodes normal. Resp: clear to auscultation bilaterally Back: symmetric, no curvature. ROM normal. No CVA tenderness. Cardio: regular rate and rhythm, S1, S2 normal, no murmur, click, rub or gallop GI: soft, non-tender; bowel sounds normal; no masses,  no organomegaly Extremities: extremities normal, atraumatic, no cyanosis or edema  ECOG PERFORMANCE STATUS: 0 - Asymptomatic  Blood pressure 100/71, pulse 82, temperature 98 F (36.7 C), temperature source Oral, resp. rate 18, height 5' 1.75" (1.568 m), weight 175 lb 3.2 oz (79.47 kg), SpO2 99 %.  LABORATORY DATA: Lab Results  Component Value Date   WBC 6.6 09/20/2014   HGB 12.6 09/20/2014   HCT 38.1 09/20/2014   MCV 83.8  09/20/2014   PLT 260 09/20/2014      Chemistry      Component Value Date/Time   NA 137 07/23/2014 1119   NA 138 05/14/2014 0858   K 4.2 07/23/2014 1119   K 4.1 05/14/2014 0858   CL 107 05/14/2014 0858   CO2 20* 07/23/2014 1119   CO2 23 05/14/2014 0858   BUN 13.8 07/23/2014 1119   BUN 16 05/14/2014 0858   CREATININE 0.8 07/23/2014 1119   CREATININE 0.68 05/14/2014 0858      Component Value Date/Time   CALCIUM 9.5 07/23/2014 1119   CALCIUM 9.4 05/14/2014 0858   ALKPHOS 107 07/23/2014 1119   ALKPHOS 107 05/14/2014 0858   AST 18 07/23/2014 1119   AST 19 05/14/2014 0858   ALT 14 07/23/2014 1119   ALT 15 05/14/2014 0858   BILITOT 0.39 07/23/2014 1119   BILITOT 0.5 05/14/2014 0858       RADIOGRAPHIC STUDIES: No results found.  ASSESSMENT AND PLAN: This is a very pleasant 64 years old with iron deficiency anemia recently treated with Feraheme infusion with significant improvement in her hemoglobin and hematocrit as well as the iron study. I recommended for the patient to continue on the oral iron tablets for now. I would see her back for follow-up visit in 3 months with repeat CBC and iron study and ferritin. She was advised to call immediately if she has any concerning symptoms in the interval. The patient voices understanding of current disease status and treatment options and is in agreement with the current care plan.  All questions were answered. The patient knows to call the clinic with any problems, questions or concerns. We can certainly see the patient much sooner if necessary.  Disclaimer: This note was dictated with voice recognition software. Similar sounding words can inadvertently be transcribed and may not be corrected upon review.

## 2014-12-05 ENCOUNTER — Ambulatory Visit (INDEPENDENT_AMBULATORY_CARE_PROVIDER_SITE_OTHER): Payer: BLUE CROSS/BLUE SHIELD | Admitting: Family Medicine

## 2014-12-05 ENCOUNTER — Encounter: Payer: Self-pay | Admitting: Family Medicine

## 2014-12-05 VITALS — BP 118/70 | HR 67 | Temp 98.2°F | Ht 61.75 in | Wt 176.3 lb

## 2014-12-05 DIAGNOSIS — M79605 Pain in left leg: Secondary | ICD-10-CM | POA: Diagnosis not present

## 2014-12-05 NOTE — Progress Notes (Signed)
Pre visit review using our clinic review tool, if applicable. No additional management support is needed unless otherwise documented below in the visit note. 

## 2014-12-05 NOTE — Patient Instructions (Signed)
BEFORE YOU LEAVE: -xray sheet -follow up in 3-4 weeks -tib ant exercises  Heat for 15 minutes twice daily  Do the exercises 3-4 days per week  Tylenol 500-1000mg  up to 3 times daily as needed for pain

## 2014-12-05 NOTE — Progress Notes (Signed)
HPI:  Leg pain: -started 4-5 months ago -occurs mainly at night -pain is throbbing, moderate, intermittent pain in L anterior lower leg -no sensation of needing to move leg or cramp -she can not think of any injuries -denies: fevers, chills, swelling, redness, malaise  ROS: See pertinent positives and negatives per HPI.  Past Medical History  Diagnosis Date  . Asthma     albuterol-rescue inhaler-uses 1-2 x year  . GERD (gastroesophageal reflux disease)     no meds  . Hiatal hernia   . Fundic gland polyps of stomach, benign   . [name] lesion, chronic     on sm bowel capsule 06/2014  . 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 Adenomatous colon polyp 09/04/2014  . Complication of anesthesia     slow to awaken    Past Surgical History  Procedure Laterality Date  . Dilation and curettage of uterus  1980  . Colonoscopy    . Wisdom tooth extraction    . Vaginal hysterectomy  02/22/2011    Procedure: HYSTERECTOMY VAGINAL;  Surgeon: Delice Lesch, MD;  Location: [facility];  Service: Gynecology;  Laterality: N/A;  . Anterior and posterior repair  02/22/2011    Procedure: ANTERIOR (CYSTOCELE) AND POSTERIOR REPAIR (RECTOCELE);  Surgeon: Delice Lesch, MD;  Location: [facility];  Service: Gynecology;  Laterality: N/A;  . Cystoscopy  02/22/2011    Procedure: CYSTOSCOPY;  Surgeon: Delice Lesch, MD;  Location: [facility];  Service: Gynecology;  Laterality: N/A;  . Salpingoophorectomy  02/22/2011    Procedure: SALPINGO OOPHERECTOMY;  Surgeon: Delice Lesch, MD;  Location: [facility];  Service: Gynecology;  Laterality: Bilateral;    Family History  Problem Relation Age of Onset  . Diabetes Father   . Lung cancer Sister 71  . Heart disease Maternal Grandmother   . Heart disease Maternal Grandfather   . Kidney disease Maternal Grandfather   . Colon cancer Neg Hx   . Colon polyps Neg Hx   . Esophageal cancer Neg Hx   . Gallbladder disease Neg Hx      Social History   Social History  . Marital Status: Married    Spouse Name: N/A  . Number of Children: 4  . Years of Education: N/A   Occupational History  . [employer]    Social History Main Topics  . Smoking status: Never Smoker   . Smokeless tobacco: Never Used  . Alcohol Use: 0.0 oz/week    0 Standard drinks or equivalent per week     Comment: Rarely  . Drug Use: No  . Sexual Activity: Not Asked   Other Topics Concern  . 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  albuterol (PROVENTIL HFA;VENTOLIN HFA) 108 (90 BASE) MCG/ACT inhaler, Inhale 2 puffs into the lungs every 6 (six) hours as needed for wheezing or shortness of breath., Disp: 1 Inhaler, Rfl: 3 .  ferrous sulfate 325 (65 FE) MG tablet, Take 325 mg by mouth daily with breakfast., Disp: , Rfl:   EXAM:  Filed Vitals:   12/05/14 0850  BP: 118/70  Pulse: 67  Temp: 98.2  F (36.8 C)    Body mass index is 32.53 kg/(m^2).  GENERAL: vitals reviewed and listed above, alert, oriented, appears well hydrated and in no acute distress  HEENT: atraumatic, conjunttiva clear, no obvious abnormalities on inspection of external nose and ears  NECK: no obvious masses on inspection  MS: moves all extremities without noticeable abnormality Normal appearance of legs other then a few scattered spider veins, mild pes planus, TTP in the L ant tibialis muscle belly, no bony TTP, normal strength and sensation, no edema or erythema, cap refill normal  PSYCH: pleasant and cooperative, no obvious depression or anxiety  ASSESSMENT AND PLAN:  Discussed the following assessment and plan:  Pain of left lower extremity - Plan: DG Tibia/Fibula Left  -suspect soft tissue pathology, possibly  strain versus tenonitis -discussed other etiologies and will get plain films and close follow up in case not improving -Patient advised to return or notify a doctor immediately if symptoms worsen or persist or new concerns arise.  Patient Instructions  BEFORE YOU LEAVE: -xray sheet -follow up in 3-4 weeks -tib ant exercises  Heat for 15 minutes twice daily  Do the exercises 3-4 days per week  Tylenol 500-1000mg  up to 3 times daily as needed for pain     Lazlo Tunney R.

## 2014-12-06 ENCOUNTER — Ambulatory Visit (INDEPENDENT_AMBULATORY_CARE_PROVIDER_SITE_OTHER)
Admission: RE | Admit: 2014-12-06 | Discharge: 2014-12-06 | Disposition: A | Discharge status: Home / Self Care | Payer: BLUE CROSS/BLUE SHIELD | Source: Ambulatory Visit | Attending: Family Medicine | Admitting: Family Medicine

## 2014-12-06 DIAGNOSIS — M79605 Pain in left leg: Secondary | ICD-10-CM | POA: Diagnosis not present

## 2014-12-25 ENCOUNTER — Other Ambulatory Visit (HOSPITAL_BASED_OUTPATIENT_CLINIC_OR_DEPARTMENT_OTHER): Payer: BLUE CROSS/BLUE SHIELD

## 2014-12-25 DIAGNOSIS — D509 Iron deficiency anemia, unspecified: Secondary | ICD-10-CM

## 2014-12-25 DIAGNOSIS — D500 Iron deficiency anemia secondary to blood loss (chronic): Secondary | ICD-10-CM

## 2014-12-25 LAB — CBC WITH DIFFERENTIAL/PLATELET
BASO%: 0.5 % (ref 0.0–2.0)
Basophils Absolute: 0 10*3/uL (ref 0.0–0.1)
EOS%: 2.9 % (ref 0.0–7.0)
Eosinophils Absolute: 0.2 10*3/uL (ref 0.0–0.5)
HCT: 38.2 % (ref 34.8–46.6)
HGB: 12.6 g/dL (ref 11.6–15.9)
LYMPH%: 29.2 % (ref 14.0–49.7)
MCH: 30 pg (ref 25.1–34.0)
MCHC: 33 g/dL (ref 31.5–36.0)
MCV: 91 fL (ref 79.5–101.0)
MONO#: 0.5 10*3/uL (ref 0.1–0.9)
MONO%: 6.2 % (ref 0.0–14.0)
NEUT#: 4.6 10*3/uL (ref 1.5–6.5)
NEUT%: 61.2 % (ref 38.4–76.8)
Platelets: 256 10*3/uL (ref 145–400)
RBC: 4.2 10*6/uL (ref 3.70–5.45)
RDW: 12.6 % (ref 11.2–14.5)
WBC: 7.6 10*3/uL (ref 3.9–10.3)
lymph#: 2.2 10*3/uL (ref 0.9–3.3)

## 2014-12-25 LAB — IRON AND TIBC CHCC
%SAT: 12 % — ABNORMAL LOW (ref 21–57)
IRON: 40 ug/dL — AB (ref 41–142)
TIBC: 348 ug/dL (ref 236–444)
UIBC: 307 ug/dL (ref 120–384)

## 2014-12-25 LAB — FERRITIN CHCC: FERRITIN: 19 ng/mL (ref 9–269)

## 2015-01-01 ENCOUNTER — Encounter: Payer: Self-pay | Admitting: Internal Medicine

## 2015-01-01 ENCOUNTER — Telehealth: Payer: Self-pay | Admitting: Internal Medicine

## 2015-01-01 ENCOUNTER — Ambulatory Visit (HOSPITAL_BASED_OUTPATIENT_CLINIC_OR_DEPARTMENT_OTHER): Payer: BLUE CROSS/BLUE SHIELD | Admitting: Internal Medicine

## 2015-01-01 VITALS — BP 113/65 | HR 70 | Temp 98.6°F | Resp 18 | Ht 61.75 in | Wt 172.1 lb

## 2015-01-01 DIAGNOSIS — D500 Iron deficiency anemia secondary to blood loss (chronic): Secondary | ICD-10-CM

## 2015-01-01 NOTE — Progress Notes (Signed)
[facility] Telephone:(336) [phone]   Fax:(336) [phone]  OFFICE PROGRESS NOTE  Lucretia Kern., DO [place] [address]  DIAGNOSIS: Iron deficiency anemia  PRIOR THERAPY: Feraheme infusion 510 MG IV weekly 2 doses last dose was given 08/02/2014.  CURRENT THERAPY: Ferrous sulfate 325 mg by mouth twice a day.  INTERVAL HISTORY: Angel Irwin 64 y.o. female returns to the clinic today for follow-up visit.  The patient has no complaints today. She denied having any significant fatigue or weakness. She has no dizzy spells. She also continues on treatment with ferrous sulfate over-the-counter formulation 1-2 tablets a day but she does not take it on daily basis. The patient denied having any significant chest pain, shortness of breath, cough or hemoptysis. She had repeat CBC and iron study performed earlier today and she is here for evaluation and discussion of her lab results.  MEDICAL HISTORY: Past Medical History  Diagnosis Date  . Asthma     albuterol-rescue inhaler-uses 1-2 x year  . GERD (gastroesophageal reflux disease)     no meds  . Hiatal hernia   . Fundic gland polyps of stomach, benign   . [name] lesion, chronic     on sm bowel capsule 06/2014  . 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 Adenomatous colon polyp 09/04/2014  . Complication of anesthesia     slow to awaken    ALLERGIES:  has No Known Allergies.  MEDICATIONS:  Current Outpatient Prescriptions  Medication Sig Dispense Refill  . albuterol (PROVENTIL HFA;VENTOLIN HFA) 108 (90 BASE) MCG/ACT inhaler Inhale 2 puffs into the lungs every 6 (six) hours as needed for wheezing or shortness of breath. 1 Inhaler 3  . ferrous sulfate 325 (65 FE) MG tablet Take 325 mg by mouth daily with breakfast.     No current facility-administered medications for this visit.    SURGICAL HISTORY:  Past Surgical History  Procedure Laterality  Date  . Dilation and curettage of uterus  1980  . Colonoscopy    . Wisdom tooth extraction    . Vaginal hysterectomy  02/22/2011    Procedure: HYSTERECTOMY VAGINAL;  Surgeon: Delice Lesch, MD;  Location: [facility];  Service: Gynecology;  Laterality: N/A;  . Anterior and posterior repair  02/22/2011    Procedure: ANTERIOR (CYSTOCELE) AND POSTERIOR REPAIR (RECTOCELE);  Surgeon: Delice Lesch, MD;  Location: [facility];  Service: Gynecology;  Laterality: N/A;  . Cystoscopy  02/22/2011    Procedure: CYSTOSCOPY;  Surgeon: Delice Lesch, MD;  Location: [facility];  Service: Gynecology;  Laterality: N/A;  . Salpingoophorectomy  02/22/2011    Procedure: SALPINGO OOPHERECTOMY;  Surgeon: Delice Lesch, MD;  Location: [facility];  Service: Gynecology;  Laterality: Bilateral;    REVIEW OF SYSTEMS:  A comprehensive review of systems was negative.   PHYSICAL EXAMINATION: General appearance: alert, cooperative and no distress Head: Normocephalic, without obvious abnormality, atraumatic Neck: no adenopathy, no JVD, supple, symmetrical, trachea midline and thyroid not enlarged, symmetric, no tenderness/mass/nodules Lymph nodes: Cervical, supraclavicular, and axillary nodes normal. Resp: clear to auscultation bilaterally Back: symmetric, no curvature. ROM normal. No CVA tenderness. Cardio: regular rate and rhythm, S1, S2 normal, no murmur, click, rub or gallop GI: soft, non-tender; bowel sounds normal; no masses,  no organomegaly Extremities: extremities normal, atraumatic, no cyanosis or edema  ECOG PERFORMANCE STATUS: 0 - Asymptomatic  Blood pressure 113/65, pulse 70, temperature  98.6 F (37 C), temperature source Oral, resp. rate 18, height 5' 1.75" (1.568 m), weight 172 lb 1.6 oz (78.064 kg), SpO2 98 %.  LABORATORY DATA: Lab Results  Component Value Date   WBC 7.6 12/25/2014   HGB 12.6 12/25/2014   HCT 38.2 12/25/2014   MCV 91.0 12/25/2014   PLT 256 12/25/2014      Chemistry      Component  Value Date/Time   NA 137 07/23/2014 1119   NA 138 05/14/2014 0858   K 4.2 07/23/2014 1119   K 4.1 05/14/2014 0858   CL 107 05/14/2014 0858   CO2 20* 07/23/2014 1119   CO2 23 05/14/2014 0858   BUN 13.8 07/23/2014 1119   BUN 16 05/14/2014 0858   CREATININE 0.8 07/23/2014 1119   CREATININE 0.68 05/14/2014 0858      Component Value Date/Time   CALCIUM 9.5 07/23/2014 1119   CALCIUM 9.4 05/14/2014 0858   ALKPHOS 107 07/23/2014 1119   ALKPHOS 107 05/14/2014 0858   AST 18 07/23/2014 1119   AST 19 05/14/2014 0858   ALT 14 07/23/2014 1119   ALT 15 05/14/2014 0858   BILITOT 0.39 07/23/2014 1119   BILITOT 0.5 05/14/2014 0858       RADIOGRAPHIC STUDIES: Dg Tibia/fibula Left  12/06/2014   CLINICAL DATA:  Lower extremity pain for approximately 4 months  EXAM: LEFT TIBIA AND FIBULA - 2 VIEW  COMPARISON:  None.  FINDINGS: Frontal and lateral views obtained. No fracture or dislocation. There is narrowing of the patellofemoral joint. No abnormal periosteal reaction. No blastic or lytic bone lesions.  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 No fracture or dislocation. No abnormal periosteal reaction. No blastic or lytic bone lesions.   Electronically Signed   By: Lowella Grip III M.D.   On: 12/06/2014 13:55    ASSESSMENT AND PLAN: This is a very pleasant 64 years old with iron deficiency anemia recently treated with Feraheme infusion with significant improvement in her hemoglobin and hematocrit as well as the iron study.  she has been on oral iron tablets for the last few months but she is not consistent with taking her medication. Her CBC showed normal hemoglobin and hematocrit today but her serum iron as well as serum ferritin are declining slowly. I recommended for the patient to continue on the oral iron tablets  Regularly for now. I would see her back for follow-up visit in 3 months with repeat CBC and iron study and ferritin. She was advised to call immediately if  she has any concerning symptoms in the interval. The patient voices understanding of current disease status and treatment options and is in agreement with the current care plan.  All questions were answered. The patient knows to call the clinic with any problems, questions or concerns. We can certainly see the patient much sooner if necessary.  Disclaimer: This note was dictated with voice recognition software. Similar sounding words can inadvertently be transcribed and may not be corrected upon review.

## 2015-01-01 NOTE — Telephone Encounter (Signed)
Gave adn printd appt sched adn avs for pt for DEC

## 2015-03-21 ENCOUNTER — Other Ambulatory Visit (HOSPITAL_BASED_OUTPATIENT_CLINIC_OR_DEPARTMENT_OTHER): Payer: BLUE CROSS/BLUE SHIELD

## 2015-03-21 DIAGNOSIS — D500 Iron deficiency anemia secondary to blood loss (chronic): Secondary | ICD-10-CM | POA: Diagnosis not present

## 2015-03-21 LAB — CBC WITH DIFFERENTIAL/PLATELET
BASO%: 0.9 % (ref 0.0–2.0)
BASOS ABS: 0.1 10*3/uL (ref 0.0–0.1)
EOS ABS: 0.2 10*3/uL (ref 0.0–0.5)
EOS%: 3.2 % (ref 0.0–7.0)
HEMATOCRIT: 40.4 % (ref 34.8–46.6)
HGB: 13.4 g/dL (ref 11.6–15.9)
LYMPH%: 31.8 % (ref 14.0–49.7)
MCH: 29.8 pg (ref 25.1–34.0)
MCHC: 33.2 g/dL (ref 31.5–36.0)
MCV: 89.7 fL (ref 79.5–101.0)
MONO#: 0.6 10*3/uL (ref 0.1–0.9)
MONO%: 7.3 % (ref 0.0–14.0)
NEUT#: 4.3 10*3/uL (ref 1.5–6.5)
NEUT%: 56.8 % (ref 38.4–76.8)
PLATELETS: 262 10*3/uL (ref 145–400)
RBC: 4.5 10*6/uL (ref 3.70–5.45)
RDW: 13.6 % (ref 11.2–14.5)
WBC: 7.6 10*3/uL (ref 3.9–10.3)
lymph#: 2.4 10*3/uL (ref 0.9–3.3)

## 2015-03-21 LAB — IRON AND TIBC
%SAT: 21 % (ref 21–57)
Iron: 66 ug/dL (ref 41–142)
TIBC: 320 ug/dL (ref 236–444)
UIBC: 254 ug/dL (ref 120–384)

## 2015-03-21 LAB — FERRITIN: Ferritin: 44 ng/ml (ref 9–269)

## 2015-03-27 ENCOUNTER — Ambulatory Visit (HOSPITAL_BASED_OUTPATIENT_CLINIC_OR_DEPARTMENT_OTHER): Payer: BLUE CROSS/BLUE SHIELD | Admitting: Internal Medicine

## 2015-03-27 ENCOUNTER — Encounter: Payer: Self-pay | Admitting: Internal Medicine

## 2015-03-27 VITALS — BP 132/72 | HR 53 | Temp 97.8°F | Resp 18 | Ht 61.75 in | Wt 172.5 lb

## 2015-03-27 DIAGNOSIS — D509 Iron deficiency anemia, unspecified: Secondary | ICD-10-CM | POA: Diagnosis not present

## 2015-03-27 DIAGNOSIS — D500 Iron deficiency anemia secondary to blood loss (chronic): Secondary | ICD-10-CM

## 2015-03-27 NOTE — Progress Notes (Signed)
[facility] Telephone:(336) [phone]   Fax:(336) [phone]  OFFICE PROGRESS NOTE  Lucretia Kern., DO [place] [address]  DIAGNOSIS: Iron deficiency anemia  PRIOR THERAPY: Feraheme infusion 510 MG IV weekly 2 doses last dose was given 08/02/2014.  CURRENT THERAPY: Ferrous sulfate 325 mg by mouth twice a day.  INTERVAL HISTORY: Angel Irwin 64 y.o. female returns to the clinic today for follow-up visit.   The patient is feeling fine today was no specific complaints except for mild fatigue early in the morning. She is tolerating the oral iron tablets fairly well with no significant adverse effects. She denied having any significant chest pain, shortness of breath, cough or hemoptysis. The patient denied having any nausea or vomiting. She had repeat CBC and iron study performed earlier today and she is here for evaluation and discussion of her lab results.  MEDICAL HISTORY: Past Medical History  Diagnosis Date  . Asthma     albuterol-rescue inhaler-uses 1-2 x year  . GERD (gastroesophageal reflux disease)     no meds  . Hiatal hernia   . Fundic gland polyps of stomach, benign   . [name] lesion, chronic     on sm bowel capsule 06/2014  . 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 Adenomatous colon polyp 09/04/2014  . Complication of anesthesia     slow to awaken    ALLERGIES:  has No Known Allergies.  MEDICATIONS:  Current Outpatient Prescriptions  Medication Sig Dispense Refill  . albuterol (PROVENTIL HFA;VENTOLIN HFA) 108 (90 BASE) MCG/ACT inhaler Inhale 2 puffs into the lungs every 6 (six) hours as needed for wheezing or shortness of breath. 1 Inhaler 3  . ferrous sulfate 325 (65 FE) MG tablet Take 325 mg by mouth daily with breakfast.     No current facility-administered medications for this visit.    SURGICAL HISTORY:  Past Surgical History  Procedure Laterality Date  . Dilation  and curettage of uterus  1980  . Colonoscopy    . Wisdom tooth extraction    . Vaginal hysterectomy  02/22/2011    Procedure: HYSTERECTOMY VAGINAL;  Surgeon: Delice Lesch, MD;  Location: [facility];  Service: Gynecology;  Laterality: N/A;  . Anterior and posterior repair  02/22/2011    Procedure: ANTERIOR (CYSTOCELE) AND POSTERIOR REPAIR (RECTOCELE);  Surgeon: Delice Lesch, MD;  Location: [facility];  Service: Gynecology;  Laterality: N/A;  . Cystoscopy  02/22/2011    Procedure: CYSTOSCOPY;  Surgeon: Delice Lesch, MD;  Location: [facility];  Service: Gynecology;  Laterality: N/A;  . Salpingoophorectomy  02/22/2011    Procedure: SALPINGO OOPHERECTOMY;  Surgeon: Delice Lesch, MD;  Location: [facility];  Service: Gynecology;  Laterality: Bilateral;    REVIEW OF SYSTEMS:  A comprehensive review of systems was negative.   PHYSICAL EXAMINATION: General appearance: alert, cooperative and no distress Head: Normocephalic, without obvious abnormality, atraumatic Neck: no adenopathy, no JVD, supple, symmetrical, trachea midline and thyroid not enlarged, symmetric, no tenderness/mass/nodules Lymph nodes: Cervical, supraclavicular, and axillary nodes normal. Resp: clear to auscultation bilaterally Back: symmetric, no curvature. ROM normal. No CVA tenderness. Cardio: regular rate and rhythm, S1, S2 normal, no murmur, click, rub or gallop GI: soft, non-tender; bowel sounds normal; no masses,  no organomegaly Extremities: extremities normal, atraumatic, no cyanosis or edema  ECOG PERFORMANCE STATUS: 0 - Asymptomatic  Blood pressure 132/72, pulse 53, temperature 97.8 F (  36.6 C), temperature source Oral, resp. rate 18, height 5' 1.75" (1.568 m), weight 172 lb 8 oz (78.245 kg), SpO2 99 %.  LABORATORY DATA: Lab Results  Component Value Date   WBC 7.6 03/21/2015   HGB 13.4 03/21/2015   HCT 40.4 03/21/2015   MCV 89.7 03/21/2015   PLT 262 03/21/2015      Chemistry      Component Value Date/Time   NA  137 07/23/2014 1119   NA 138 05/14/2014 0858   K 4.2 07/23/2014 1119   K 4.1 05/14/2014 0858   CL 107 05/14/2014 0858   CO2 20* 07/23/2014 1119   CO2 23 05/14/2014 0858   BUN 13.8 07/23/2014 1119   BUN 16 05/14/2014 0858   CREATININE 0.8 07/23/2014 1119   CREATININE 0.68 05/14/2014 0858      Component Value Date/Time   CALCIUM 9.5 07/23/2014 1119   CALCIUM 9.4 05/14/2014 0858   ALKPHOS 107 07/23/2014 1119   ALKPHOS 107 05/14/2014 0858   AST 18 07/23/2014 1119   AST 19 05/14/2014 0858   ALT 14 07/23/2014 1119   ALT 15 05/14/2014 0858   BILITOT 0.39 07/23/2014 1119   BILITOT 0.5 05/14/2014 0858     Other lab results:  Serum ferritin 44, serum iron 66, total iron binding capacity 320 and iron septation 21%.  RADIOGRAPHIC STUDIES: No results found.  ASSESSMENT AND PLAN: This is a very pleasant 64 years old with iron deficiency anemia recently treated with Feraheme infusion with significant improvement in her hemoglobin and hematocrit as well as the iron study.  she is currently on oral iron tablets and tolerating it well. Her hemoglobin, hematocrit as well as iron study and ferritin are within the normal range.  I recommended for the patient to continue on the oral iron tablets for now. She will continue her routine follow-up visit with her primary care physician at this point. I'll be happy to see the patient in the future if needed. She was advised to call immediately if she has any concerning symptoms. The patient voices understanding of current disease status and treatment options and is in agreement with the current care plan.  All questions were answered. The patient knows to call the clinic with any problems, questions or concerns. We can certainly see the patient much sooner if necessary.  Disclaimer: This note was dictated with voice recognition software. Similar sounding words can inadvertently be transcribed and may not be corrected upon review.

## 2015-05-28 ENCOUNTER — Ambulatory Visit (INDEPENDENT_AMBULATORY_CARE_PROVIDER_SITE_OTHER): Payer: BLUE CROSS/BLUE SHIELD | Admitting: Family Medicine

## 2015-05-28 ENCOUNTER — Encounter: Payer: Self-pay | Admitting: Family Medicine

## 2015-05-28 VITALS — BP 98/70 | HR 68 | Temp 98.0°F | Ht 61.75 in | Wt 173.0 lb

## 2015-05-28 DIAGNOSIS — Z0000 Encounter for general adult medical examination without abnormal findings: Secondary | ICD-10-CM | POA: Diagnosis not present

## 2015-05-28 DIAGNOSIS — D500 Iron deficiency anemia secondary to blood loss (chronic): Secondary | ICD-10-CM | POA: Diagnosis not present

## 2015-05-28 DIAGNOSIS — Z23 Encounter for immunization: Secondary | ICD-10-CM | POA: Diagnosis not present

## 2015-05-28 LAB — HEMOGLOBIN A1C: Hgb A1c MFr Bld: 5.5 % (ref 4.6–6.5)

## 2015-05-28 LAB — CBC WITH DIFFERENTIAL/PLATELET
BASOS ABS: 0 10*3/uL (ref 0.0–0.1)
BASOS PCT: 0.7 % (ref 0.0–3.0)
EOS ABS: 0.2 10*3/uL (ref 0.0–0.7)
Eosinophils Relative: 2.7 % (ref 0.0–5.0)
HEMATOCRIT: 41 % (ref 36.0–46.0)
Hemoglobin: 13.7 g/dL (ref 12.0–15.0)
LYMPHS ABS: 1.8 10*3/uL (ref 0.7–4.0)
LYMPHS PCT: 30.4 % (ref 12.0–46.0)
MCHC: 33.4 g/dL (ref 30.0–36.0)
MCV: 90.8 fl (ref 78.0–100.0)
MONO ABS: 0.4 10*3/uL (ref 0.1–1.0)
Monocytes Relative: 6.6 % (ref 3.0–12.0)
NEUTROS ABS: 3.5 10*3/uL (ref 1.4–7.7)
NEUTROS PCT: 59.6 % (ref 43.0–77.0)
PLATELETS: 273 10*3/uL (ref 150.0–400.0)
RBC: 4.52 Mil/uL (ref 3.87–5.11)
RDW: 13.1 % (ref 11.5–15.5)
WBC: 5.9 10*3/uL (ref 4.0–10.5)

## 2015-05-28 LAB — LIPID PANEL
CHOLESTEROL: 195 mg/dL (ref 0–200)
HDL: 35.3 mg/dL — ABNORMAL LOW (ref >39.00)
LDL CALC: 130 mg/dL — AB (ref 0–99)
NonHDL: 159.55
TRIGLYCERIDES: 149 mg/dL (ref 0.0–149.0)
Total CHOL/HDL Ratio: 6
VLDL: 29.8 mg/dL (ref 0.0–40.0)

## 2015-05-28 LAB — BASIC METABOLIC PANEL WITH GFR
BUN: 13 mg/dL (ref 6–23)
CO2: 28 meq/L (ref 19–32)
Calcium: 10.5 mg/dL (ref 8.4–10.5)
Chloride: 105 meq/L (ref 96–112)
Creatinine, Ser: 0.73 mg/dL (ref 0.40–1.20)
GFR: 85.2 mL/min
Glucose, Bld: 100 mg/dL — ABNORMAL HIGH (ref 70–99)
Potassium: 4.7 meq/L (ref 3.5–5.1)
Sodium: 140 meq/L (ref 135–145)

## 2015-05-28 NOTE — Progress Notes (Signed)
HPI:  Here for CPE:  -Concerns and/or follow up today: none - unfortunately let go from job - now in process of interviewing. Doing ok.  -Diet: ok  -Exercise: no regular exercise  -Taking folic acid, vitamin D or calcium: no  -Diabetes and Dyslipidemia Screening: FASTING for labs  -Hx of HTN: no  -Vaccines: wants flu vaccine today  -pap history: s/p complete  Hysterectomy for uterine prolapse  -FDLMP:n/a  -sexual activity: yes, female partner, no new partners  -wants STI testing (Hep C if born [birth year]): no to STI, yes to hep c screen  -FH breast, colon or ovarian ca: see FH Last mammogram: 06/2014 Last colon cancer screening: 08/2014  -Alcohol, Tobacco, drug use: see social history  Review of Systems - no fevers, unintentional weight loss, vision loss, hearing loss, chest pain, sob, hemoptysis, melena, hematochezia, hematuria, genital discharge, changing or concerning skin lesions, bleeding, bruising, loc, thoughts of self harm or SI  Past Medical History  Diagnosis Date  . Asthma     albuterol-rescue inhaler-uses 1-2 x year  . GERD (gastroesophageal reflux disease)     no meds  . Hiatal hernia   . Fundic gland polyps of stomach, benign   . [name] lesion, chronic     on sm bowel capsule 06/2014  . 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 Adenomatous colon polyp 09/04/2014  . Complication of anesthesia     slow to awaken    Past Surgical History  Procedure Laterality Date  . Dilation and curettage of uterus  1980  . Colonoscopy    . Wisdom tooth extraction    . Vaginal hysterectomy  02/22/2011    Procedure: HYSTERECTOMY VAGINAL;  Surgeon: Delice Lesch, MD;  Location: [facility];  Service: Gynecology;  Laterality: N/A;  . Anterior and posterior repair  02/22/2011    Procedure: ANTERIOR (CYSTOCELE) AND POSTERIOR REPAIR (RECTOCELE);  Surgeon: Delice Lesch, MD;  Location: [facility];  Service: Gynecology;  Laterality: N/A;  .  Cystoscopy  02/22/2011    Procedure: CYSTOSCOPY;  Surgeon: Delice Lesch, MD;  Location: [facility];  Service: Gynecology;  Laterality: N/A;  . Salpingoophorectomy  02/22/2011    Procedure: SALPINGO OOPHERECTOMY;  Surgeon: Delice Lesch, MD;  Location: [facility];  Service: Gynecology;  Laterality: Bilateral;    Family History  Problem Relation Age of Onset  . Diabetes Father   . Lung cancer Sister 80  . Heart disease Maternal Grandmother   . Heart disease Maternal Grandfather   . Kidney disease Maternal Grandfather   . Colon cancer Neg Hx   . Colon polyps Neg Hx   . Esophageal cancer Neg Hx   . Gallbladder disease Neg Hx     Social History   Social History  . Marital Status: Married    Spouse Name: N/A  . Number of Children: 4  . Years of Education: N/A   Occupational History  . [employer]    Social History Main Topics  . Smoking status: Never Smoker   . Smokeless tobacco: Never Used  . Alcohol Use: 0.0 oz/week    0 Standard drinks or equivalent per week     Comment: Rarely  . Drug Use: No  . Sexual Activity: Not Asked   Other Topics Concern  . 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  albuterol (PROVENTIL HFA;VENTOLIN HFA) 108 (90 BASE) MCG/ACT inhaler, Inhale 2 puffs into the lungs every 6 (six) hours as needed for wheezing or shortness of breath., Disp: 1 Inhaler, Rfl: 3 .  ferrous sulfate 325 (65 FE) MG tablet, Take 325 mg by mouth daily with breakfast., Disp: , Rfl:   EXAM:  Filed Vitals:   05/28/15 0813  BP: 98/70  Pulse: 68  Temp: 98 F (36.7 C)    GENERAL: vitals reviewed and listed below, alert, oriented, appears well hydrated and in no acute distress  HEENT: head atraumatic, PERRLA, normal appearance of eyes,  ears, nose and mouth. moist mucus membranes.  NECK: supple, no masses or lymphadenopathy  LUNGS: clear to auscultation bilaterally, no rales, rhonchi or wheeze  CV: HRRR, no peripheral edema or cyanosis, normal pedal pulses  BREAST: offered - declined  ABDOMEN: bowel sounds normal, soft, non tender to palpation, no masses, no rebound or guarding  GU: offered - declined  SKIN: no rash or abnormal lesions - declined full skin exam  MS: normal gait, moves all extremities normally  NEURO: CN II-XII grossly intact, normal muscle strength and sensation to light touch on extremities  PSYCH: normal affect, pleasant and cooperative  ASSESSMENT AND PLAN:  Discussed the following assessment and plan:  Visit for preventive health examination - Plan: Hemoglobin A1c, Lipid Panel, Basic metabolic panel, Hep C Antibody  Iron deficiency anemia due to chronic blood loss - Plan: CBC with Differential   -Discussed and advised all [place] preventive services health task force level A and B recommendations for age, sex and risks.  -Advised at least 150 minutes of exercise per week and a healthy diet low in saturated fats and sweets and consisting of fresh fruits and vegetables, lean meats such as fish and white chicken and whole grains.  -FASTING labs, studies and vaccines per orders this encounter  Orders Placed This Encounter  Procedures  . CBC with Differential  . Hemoglobin A1c  . Lipid Panel  . Basic metabolic panel  . Hep C Antibody    Patient advised to return to clinic immediately if symptoms worsen or persist or new concerns.  Patient Instructions  BEFORE YOU LEAVE: -flu shot -labs -follow up yearly and as needed  Vit D3 1000 IU daily  -We have ordered labs or studies at this visit. It can take up to 1-2 weeks for results and processing. We will contact you with instructions IF your results are abnormal. Normal results will be released to your [facility]. If you have not heard from  [place] or can not find your results in [facility] in 2 weeks please contact our office.  We recommend the following healthy lifestyle measures: - eat a healthy whole foods diet consisting of regular small meals composed of vegetables, fruits, beans, nuts, seeds, healthy meats such as white chicken and fish and whole grains.  - avoid sweets, white starchy foods, fried foods, fast food, processed foods, sodas, red meet and other fattening foods.  - get a least 150-300 minutes of aerobic exercise per week.           No Follow-up on file.  Colin Benton R.

## 2015-05-28 NOTE — Patient Instructions (Signed)
BEFORE YOU LEAVE: -flu shot -labs -follow up yearly and as needed  Vit D3 1000 IU daily  -We have ordered labs or studies at this visit. It can take up to 1-2 weeks for results and processing. We will contact you with instructions IF your results are abnormal. Normal results will be released to your [facility]. If you have not heard from [place] or can not find your results in [facility] in 2 weeks please contact our office.  We recommend the following healthy lifestyle measures: - eat a healthy whole foods diet consisting of regular small meals composed of vegetables, fruits, beans, nuts, seeds, healthy meats such as white chicken and fish and whole grains.  - avoid sweets, white starchy foods, fried foods, fast food, processed foods, sodas, red meet and other fattening foods.  - get a least 150-300 minutes of aerobic exercise per week.

## 2015-05-28 NOTE — Progress Notes (Signed)
Pre visit review using our clinic review tool, if applicable. No additional management support is needed unless otherwise documented below in the visit note. 

## 2015-05-29 LAB — HEPATITIS C ANTIBODY: HCV Ab: NEGATIVE

## 2016-02-16 ENCOUNTER — Telehealth: Payer: Self-pay | Admitting: *Deleted

## 2016-02-16 ENCOUNTER — Encounter: Payer: Self-pay | Admitting: Family Medicine

## 2016-02-16 NOTE — Telephone Encounter (Signed)
I called the pt and informed her of the message below and offered to schedule an appt.  Patient stated she works in [place] now and will check her schedule and call back for an appt.

## 2016-02-19 ENCOUNTER — Ambulatory Visit (INDEPENDENT_AMBULATORY_CARE_PROVIDER_SITE_OTHER): Payer: BLUE CROSS/BLUE SHIELD | Admitting: Family Medicine

## 2016-02-19 ENCOUNTER — Encounter: Payer: Self-pay | Admitting: Family Medicine

## 2016-02-19 VITALS — BP 130/70 | HR 67 | Temp 97.9°F | Ht 61.75 in | Wt 164.5 lb

## 2016-02-19 DIAGNOSIS — Z23 Encounter for immunization: Secondary | ICD-10-CM | POA: Diagnosis not present

## 2016-02-19 DIAGNOSIS — M25561 Pain in right knee: Secondary | ICD-10-CM | POA: Diagnosis not present

## 2016-02-19 NOTE — Progress Notes (Signed)
HPI:  Acute visit for:  R knee pain: -started about 1 week ago after increased activity - up an down a lot and taking care of 3 and 65 yo grandkids  -burning to sharp pain in just below anteromedial R knee -this is improving with heat and nsaids -denies known injury, trauma, fevers, malaise, swelling, redness, radiation, weakness, numbness, clicking, popping, giving away   ROS: See pertinent positives and negatives per HPI.  Past Medical History:  Diagnosis Date  . Adenomatous colon polyp 09/04/2014  . Asthma    albuterol-rescue inhaler-uses 1-2 x year  . [name] lesion, chronic    on sm bowel capsule 06/2014  . Complication of anesthesia    slow to awaken  . Fundic gland polyps of stomach, benign   . GERD (gastroesophageal reflux disease)    no meds  . Hiatal hernia   . 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 ANTERIOR AND POSTERIOR REPAIR  02/22/2011   Procedure: ANTERIOR (CYSTOCELE) AND POSTERIOR REPAIR (RECTOCELE);  Surgeon: Delice Lesch, MD;  Location: [facility];  Service: Gynecology;  Laterality: N/A;  . COLONOSCOPY    . CYSTOSCOPY  02/22/2011   Procedure: CYSTOSCOPY;  Surgeon: Delice Lesch, MD;  Location: [facility];  Service: Gynecology;  Laterality: N/A;  . DILATION AND CURETTAGE OF UTERUS  1980  . SALPINGOOPHORECTOMY  02/22/2011   Procedure: SALPINGO OOPHERECTOMY;  Surgeon: Delice Lesch, MD;  Location: [facility];  Service: Gynecology;  Laterality: Bilateral;  . VAGINAL HYSTERECTOMY  02/22/2011   Procedure: HYSTERECTOMY VAGINAL;  Surgeon: Delice Lesch, MD;  Location: [facility];  Service: Gynecology;  Laterality: N/A;  . WISDOM TOOTH EXTRACTION      Family History  Problem Relation Age of Onset  . Diabetes Father   . Lung cancer Sister 93  . Heart disease Maternal Grandmother   . Heart disease Maternal Grandfather   . Kidney disease Maternal Grandfather   . Colon  cancer Neg Hx   . Colon polyps Neg Hx   . Esophageal cancer Neg Hx   . Gallbladder disease Neg Hx     Social History   Social History  . Marital status: Married    Spouse name: N/A  . Number of children: 4  . Years of education: N/A   Occupational History  . [employer]    Social History Main Topics  . Smoking status: Never Smoker  . Smokeless tobacco: Never Used  . Alcohol use 0.0 oz/week     Comment: Rarely  . Drug use: No  . Sexual activity: Not Asked   Other Topics Concern  . 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  albuterol (PROVENTIL HFA;VENTOLIN HFA) 108 (90 BASE) MCG/ACT inhaler, Inhale 2 puffs into the lungs every 6 (six) hours as needed for wheezing or shortness of breath., Disp: 1 Inhaler, Rfl: 3 .  ferrous sulfate 325 (65 FE) MG tablet, Take 325 mg by mouth daily with breakfast., Disp: , Rfl:   EXAM:  Vitals:   02/19/16 1621  BP: 130/70  Pulse: 67  Temp: 97.9  F (36.6 C)    Body mass index is 30.33 kg/m.  GENERAL: vitals reviewed and listed above, alert, oriented, appears well hydrated and in no acute distress  HEENT: atraumatic, conjunttiva clear, no obvious abnormalities on inspection of external nose and ears  NECK: no obvious masses on inspection  LUNGS: clear to auscultation bilaterally, no wheezes, rales or rhonchi, good air movement  CV: HRRR, no peripheral edema  MS: moves all extremities without noticeable abnormality, normal inspection of both knees, normal inspection of both knees, tenderness to palpation just below the anterior medial portion of the right knee over the pedis anserine bursa, no redness or significant swelling, positive patellar crepitus, negative Lachman, negative valgus varus  stress, negative Murray, neurovascularly intact distally  PSYCH: pleasant and cooperative, no obvious depression or anxiety  ASSESSMENT AND PLAN:  Discussed the following assessment and plan:  Acute pain of right knee -Suspect mild pedis anserine bursitis -Significant GI history advised avoidance of regular NSAID use if possible . Opted to try Tylenol and topical products for pain. Ice . Home exercise program provided. Follow-up in one month, or sooner if needed.    Encounter for immunization - Plan: Flu vaccine HIGH DOSE PF  Need for prophylactic vaccination against Streptococcus pneumoniae (pneumococcus) - Plan: Pneumococcal conjugate vaccine 13-valent IM  -Patient advised to return or notify a doctor immediately if symptoms worsen or persist or new concerns arise.  Patient Instructions  BEFORE YOU LEAVE: -follow up: 1 month -pes anserine bursitis exercises  Try the exercise 4 days per week.  Tylenol, ice or topical sports creams for pain as needed.  Follow up sooner if worsening or new concerns.    Colin Benton R., DO

## 2016-02-19 NOTE — Patient Instructions (Signed)
BEFORE YOU LEAVE: -follow up: 1 month -pes anserine bursitis exercises  Try the exercise 4 days per week.  Tylenol, ice or topical sports creams for pain as needed.  Follow up sooner if worsening or new concerns.

## 2016-02-19 NOTE — Progress Notes (Signed)
Pre visit review using our clinic review tool, if applicable. No additional management support is needed unless otherwise documented below in the visit note. 

## 2016-03-26 ENCOUNTER — Ambulatory Visit: Payer: BLUE CROSS/BLUE SHIELD | Admitting: Family Medicine

## 2016-07-23 ENCOUNTER — Emergency Department (HOSPITAL_COMMUNITY): Payer: BLUE CROSS/BLUE SHIELD

## 2016-07-23 ENCOUNTER — Encounter (HOSPITAL_COMMUNITY): Payer: Self-pay

## 2016-07-23 ENCOUNTER — Inpatient Hospital Stay (HOSPITAL_COMMUNITY)
Admission: EM | Admit: 2016-07-23 | Discharge: 2016-07-29 | DRG: 488 | Disposition: A | Discharge status: Home Health | Payer: BLUE CROSS/BLUE SHIELD | Attending: Orthopedic Surgery | Admitting: Orthopedic Surgery

## 2016-07-23 DIAGNOSIS — D62 Acute posthemorrhagic anemia: Secondary | ICD-10-CM | POA: Diagnosis not present

## 2016-07-23 DIAGNOSIS — Z09 Encounter for follow-up examination after completed treatment for conditions other than malignant neoplasm: Secondary | ICD-10-CM

## 2016-07-23 DIAGNOSIS — W010XXA Fall on same level from slipping, tripping and stumbling without subsequent striking against object, initial encounter: Secondary | ICD-10-CM | POA: Diagnosis present

## 2016-07-23 DIAGNOSIS — M810 Age-related osteoporosis without current pathological fracture: Secondary | ICD-10-CM | POA: Diagnosis present

## 2016-07-23 DIAGNOSIS — D500 Iron deficiency anemia secondary to blood loss (chronic): Secondary | ICD-10-CM | POA: Diagnosis present

## 2016-07-23 DIAGNOSIS — S82142A Displaced bicondylar fracture of left tibia, initial encounter for closed fracture: Secondary | ICD-10-CM | POA: Diagnosis not present

## 2016-07-23 DIAGNOSIS — Z9889 Other specified postprocedural states: Secondary | ICD-10-CM

## 2016-07-23 DIAGNOSIS — E559 Vitamin D deficiency, unspecified: Secondary | ICD-10-CM | POA: Diagnosis present

## 2016-07-23 DIAGNOSIS — Y93K1 Activity, walking an animal: Secondary | ICD-10-CM | POA: Diagnosis not present

## 2016-07-23 DIAGNOSIS — D509 Iron deficiency anemia, unspecified: Secondary | ICD-10-CM | POA: Diagnosis not present

## 2016-07-23 DIAGNOSIS — S82122A Displaced fracture of lateral condyle of left tibia, initial encounter for closed fracture: Secondary | ICD-10-CM

## 2016-07-23 DIAGNOSIS — M898X9 Other specified disorders of bone, unspecified site: Secondary | ICD-10-CM | POA: Diagnosis present

## 2016-07-23 DIAGNOSIS — Z8781 Personal history of (healed) traumatic fracture: Secondary | ICD-10-CM

## 2016-07-23 DIAGNOSIS — K449 Diaphragmatic hernia without obstruction or gangrene: Secondary | ICD-10-CM | POA: Diagnosis not present

## 2016-07-23 DIAGNOSIS — S83282A Other tear of lateral meniscus, current injury, left knee, initial encounter: Secondary | ICD-10-CM | POA: Diagnosis present

## 2016-07-23 DIAGNOSIS — K219 Gastro-esophageal reflux disease without esophagitis: Secondary | ICD-10-CM | POA: Diagnosis not present

## 2016-07-23 DIAGNOSIS — J45909 Unspecified asthma, uncomplicated: Secondary | ICD-10-CM | POA: Diagnosis present

## 2016-07-23 DIAGNOSIS — W19XXXA Unspecified fall, initial encounter: Secondary | ICD-10-CM

## 2016-07-23 DIAGNOSIS — Z419 Encounter for procedure for purposes other than remedying health state, unspecified: Secondary | ICD-10-CM

## 2016-07-23 DIAGNOSIS — S82143A Displaced bicondylar fracture of unspecified tibia, initial encounter for closed fracture: Secondary | ICD-10-CM

## 2016-07-23 HISTORY — DX: Vitamin D deficiency, unspecified: E55.9

## 2016-07-23 HISTORY — DX: Osteopathy in diseases classified elsewhere, unspecified site: M90.80

## 2016-07-23 HISTORY — DX: Metabolic disorder, unspecified: E88.9

## 2016-07-23 HISTORY — DX: Displaced fracture of lateral condyle of left tibia, initial encounter for closed fracture: S82.122A

## 2016-07-23 LAB — CBC WITH DIFFERENTIAL/PLATELET
Basophils Absolute: 0 10*3/uL (ref 0.0–0.1)
Basophils Relative: 0 %
EOS ABS: 0.1 10*3/uL (ref 0.0–0.7)
Eosinophils Relative: 1 %
HEMATOCRIT: 37.6 % (ref 36.0–46.0)
HEMOGLOBIN: 12.3 g/dL (ref 12.0–15.0)
LYMPHS ABS: 1.8 10*3/uL (ref 0.7–4.0)
Lymphocytes Relative: 22 %
MCH: 29.6 pg (ref 26.0–34.0)
MCHC: 32.7 g/dL (ref 30.0–36.0)
MCV: 90.4 fL (ref 78.0–100.0)
MONOS PCT: 4 %
Monocytes Absolute: 0.4 10*3/uL (ref 0.1–1.0)
NEUTROS PCT: 73 %
Neutro Abs: 6 10*3/uL (ref 1.7–7.7)
Platelets: 222 10*3/uL (ref 150–400)
RBC: 4.16 MIL/uL (ref 3.87–5.11)
RDW: 13.3 % (ref 11.5–15.5)
WBC: 8.4 10*3/uL (ref 4.0–10.5)

## 2016-07-23 LAB — COMPREHENSIVE METABOLIC PANEL
ALK PHOS: 101 U/L (ref 38–126)
ALT: 25 U/L (ref 14–54)
ANION GAP: 9 (ref 5–15)
AST: 23 U/L (ref 15–41)
Albumin: 3.6 g/dL (ref 3.5–5.0)
BILIRUBIN TOTAL: 0.6 mg/dL (ref 0.3–1.2)
BUN: 10 mg/dL (ref 6–20)
CALCIUM: 9.8 mg/dL (ref 8.9–10.3)
CO2: 22 mmol/L (ref 22–32)
Chloride: 107 mmol/L (ref 101–111)
Creatinine, Ser: 0.66 mg/dL (ref 0.44–1.00)
Glucose, Bld: 106 mg/dL — ABNORMAL HIGH (ref 65–99)
Potassium: 4 mmol/L (ref 3.5–5.1)
SODIUM: 138 mmol/L (ref 135–145)
TOTAL PROTEIN: 7 g/dL (ref 6.5–8.1)

## 2016-07-23 MED ORDER — DOCUSATE SODIUM 100 MG PO CAPS
100.0000 mg | ORAL_CAPSULE | Freq: Two times a day (BID) | ORAL | Status: DC
  Administered 2016-07-24 – 2016-07-26 (×6): 100 mg via ORAL
  Filled 2016-07-23 (×6): qty 1

## 2016-07-23 MED ORDER — HYDROCODONE-ACETAMINOPHEN 5-325 MG PO TABS
1.0000 | ORAL_TABLET | ORAL | Status: DC | PRN
  Administered 2016-07-24 – 2016-07-26 (×7): 1 via ORAL
  Administered 2016-07-27: 2 via ORAL
  Administered 2016-07-27: 1 via ORAL
  Administered 2016-07-27: 2 via ORAL
  Administered 2016-07-28 (×2): 1 via ORAL
  Administered 2016-07-28: 2 via ORAL
  Administered 2016-07-29 (×2): 1 via ORAL
  Filled 2016-07-23: qty 1
  Filled 2016-07-23: qty 2
  Filled 2016-07-23 (×6): qty 1
  Filled 2016-07-23: qty 2
  Filled 2016-07-23 (×4): qty 1
  Filled 2016-07-23: qty 2
  Filled 2016-07-23: qty 1

## 2016-07-23 MED ORDER — METHOCARBAMOL 500 MG PO TABS
500.0000 mg | ORAL_TABLET | Freq: Four times a day (QID) | ORAL | Status: DC | PRN
  Administered 2016-07-24 – 2016-07-29 (×14): 500 mg via ORAL
  Filled 2016-07-23 (×13): qty 1

## 2016-07-23 MED ORDER — DIPHENHYDRAMINE HCL 12.5 MG/5ML PO ELIX: 12.5000 mg | ORAL_SOLUTION | ORAL | Status: DC | PRN

## 2016-07-23 MED ORDER — POLYETHYLENE GLYCOL 3350 17 G PO PACK
17.0000 g | PACK | Freq: Every day | ORAL | Status: DC | PRN
  Administered 2016-07-26: 17 g via ORAL
  Filled 2016-07-23: qty 1

## 2016-07-23 MED ORDER — ALBUTEROL SULFATE (2.5 MG/3ML) 0.083% IN NEBU: 2.5000 mg | INHALATION_SOLUTION | Freq: Four times a day (QID) | RESPIRATORY_TRACT | Status: DC | PRN

## 2016-07-23 MED ORDER — ONDANSETRON HCL 4 MG PO TABS: 4.0000 mg | ORAL_TABLET | Freq: Four times a day (QID) | ORAL | Status: DC | PRN

## 2016-07-23 MED ORDER — ACETAMINOPHEN 650 MG RE SUPP: 650.0000 mg | Freq: Four times a day (QID) | RECTAL | Status: DC | PRN

## 2016-07-23 MED ORDER — FERROUS SULFATE 325 (65 FE) MG PO TABS
325.0000 mg | ORAL_TABLET | Freq: Every day | ORAL | Status: DC
  Administered 2016-07-25 – 2016-07-29 (×4): 325 mg via ORAL
  Filled 2016-07-23 (×4): qty 1

## 2016-07-23 MED ORDER — METHOCARBAMOL 1000 MG/10ML IJ SOLN
500.0000 mg | Freq: Four times a day (QID) | INTRAVENOUS | Status: DC | PRN
  Filled 2016-07-23: qty 5

## 2016-07-23 MED ORDER — ACETAMINOPHEN 325 MG PO TABS
650.0000 mg | ORAL_TABLET | Freq: Four times a day (QID) | ORAL | Status: DC | PRN
  Administered 2016-07-24 (×2): 650 mg via ORAL
  Filled 2016-07-23 (×2): qty 2

## 2016-07-23 MED ORDER — SENNA 8.6 MG PO TABS
1.0000 | ORAL_TABLET | Freq: Two times a day (BID) | ORAL | Status: DC
  Administered 2016-07-24 – 2016-07-26 (×6): 8.6 mg via ORAL
  Filled 2016-07-23 (×6): qty 1

## 2016-07-23 MED ORDER — ONDANSETRON HCL 4 MG/2ML IJ SOLN
4.0000 mg | Freq: Four times a day (QID) | INTRAMUSCULAR | Status: DC | PRN
  Filled 2016-07-23: qty 2

## 2016-07-23 MED ORDER — HYDROMORPHONE HCL 1 MG/ML IJ SOLN
0.5000 mg | INTRAMUSCULAR | Status: DC | PRN
  Administered 2016-07-25: 1 mg via INTRAVENOUS
  Filled 2016-07-23: qty 1

## 2016-07-23 NOTE — ED Provider Notes (Signed)
[facility] DEPT Provider Note   CSN: [id number] Arrival date & time: 07/23/16  1930     History   Chief Complaint Chief Complaint  Patient presents with  . Knee Pain    HPI Angel Irwin is a 66 y.o. female.  Patient got tangled up in the dogs rope. She hurt her left knee significantly   The history is provided by the patient.  Knee Pain   This is a new problem. The current episode started 6 to 12 hours ago. The problem occurs constantly. The problem has not changed since onset.The pain is present in the left knee. The quality of the pain is described as dull. The pain is at a severity of 7/10. The pain is moderate. Pertinent negatives include no numbness. She has tried nothing for the symptoms. The treatment provided no relief.    Past Medical History:  Diagnosis Date  . Adenomatous colon polyp 09/04/2014  . Asthma    albuterol-rescue inhaler-uses 1-2 x year  . [name] lesion, chronic    on sm bowel capsule 06/2014  . Complication of anesthesia    slow to awaken  . Fundic gland polyps of stomach, benign   . GERD (gastroesophageal reflux disease)    no meds  . Hiatal hernia   . 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    Patient Active Problem List   Diagnosis Date Noted  . Hiatal hernia 09/04/2014  . 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 Iron deficiency anemia due to chronic blood loss 07/23/2014  . Asthma, chronic 04/08/2014    Past Surgical History:  Procedure Laterality Date  . ANTERIOR AND POSTERIOR REPAIR  02/22/2011   Procedure: ANTERIOR (CYSTOCELE) AND POSTERIOR REPAIR (RECTOCELE);  Surgeon: Delice Lesch, MD;  Location: [facility];  Service: Gynecology;  Laterality: N/A;  . COLONOSCOPY    . CYSTOSCOPY  02/22/2011   Procedure: CYSTOSCOPY;  Surgeon: Delice Lesch, MD;  Location: [facility];  Service: Gynecology;  Laterality: N/A;  . DILATION AND CURETTAGE OF UTERUS  1980  . SALPINGOOPHORECTOMY  02/22/2011   Procedure: SALPINGO OOPHERECTOMY;  Surgeon: Delice Lesch, MD;  Location: [facility];  Service: Gynecology;  Laterality: Bilateral;  . VAGINAL HYSTERECTOMY  02/22/2011   Procedure: HYSTERECTOMY VAGINAL;  Surgeon: Delice Lesch, MD;  Location: [facility];  Service: Gynecology;  Laterality: N/A;  . WISDOM TOOTH EXTRACTION      OB History    No data available       Home Medications    Prior to Admission medications   Medication Sig Start Date End Date Taking? Authorizing Provider  albuterol (PROVENTIL HFA;VENTOLIN HFA) 108 (90 BASE) MCG/ACT inhaler Inhale 2 puffs into the lungs every 6 (six) hours as needed for wheezing or shortness of breath. 09/09/14   Lucretia Kern, DO  ferrous sulfate 325 (65 FE) MG tablet Take 325 mg by mouth daily with breakfast.    Historical Provider, MD    Family History Family History  Problem Relation Age of Onset  . Diabetes Father   . Lung cancer Sister 24  . Heart disease Maternal Grandmother   . Heart disease Maternal Grandfather   . Kidney disease Maternal Grandfather   . Colon cancer Neg Hx   . Colon polyps Neg Hx   . Esophageal cancer Neg Hx   . Gallbladder disease Neg Hx     Social History Social History  Substance Use Topics  . Smoking status: Never Smoker  .  Smokeless tobacco: Never Used  . Alcohol use 0.0 oz/week     Comment: Rarely     Allergies   Patient has no known allergies.   Review of Systems Review of Systems  Constitutional: Negative for appetite change and fatigue.  HENT: Negative for congestion, ear discharge and sinus pressure.   Eyes: Negative for discharge.  Respiratory: Negative for cough.   Cardiovascular: Negative for chest pain.  Gastrointestinal: Negative for abdominal pain and diarrhea.  Genitourinary: Negative for frequency and hematuria.  Musculoskeletal: Negative for back pain.       Left knee pain  Skin: Negative for rash.  Neurological: Negative for seizures, numbness and headaches.    Psychiatric/Behavioral: Negative for hallucinations.     Physical Exam Updated Vital Signs BP 116/72   Pulse 63   Temp 98.7 F (37.1 C) (Oral)   Resp 20   SpO2 97%   Physical Exam  Constitutional: She is oriented to person, place, and time. She appears well-developed.  HENT:  Head: Normocephalic.  Eyes: Conjunctivae are normal.  Neck: No tracheal deviation present.  Cardiovascular:  No murmur heard. Musculoskeletal: Normal range of motion.  Swollen left knee with tenderness to left lateral knee. Neurovascular exam normal  Neurological: She is oriented to person, place, and time.  Skin: Skin is warm.  Psychiatric: She has a normal mood and affect.     ED Treatments / Results  Labs (all labs ordered are listed, but only abnormal results are displayed) Labs Reviewed  CBC WITH DIFFERENTIAL/PLATELET  COMPREHENSIVE METABOLIC PANEL  TYPE AND SCREEN    EKG  EKG Interpretation None       Radiology Ct Knee Left Wo Contrast  Result Date: 07/23/2016 CLINICAL DATA:  Left knee pain after fall onto concrete. EXAM: CT OF THE LEFT KNEE WITHOUT CONTRAST TECHNIQUE: Multidetector CT imaging of the knee was performed according to the standard protocol. Multiplanar CT image reconstructions were also generated. COMPARISON:  None. FINDINGS: Bones/Joint/Cartilage Acute, lateral tibial plateau fracture measuring 2.4 cm transverse by 0.7 cm in depth by 3.5 cm AP. A sagittally oriented peripheral fracture is seen in addition to the central compression through the lateral tibial physis and metaphysis. Associated lipohemarthrosis. Spurring is noted about the femorotibial compartment involving the femoral condyles, lateral tibial plateau tibial spine. The proximal fibula is intact. All femoral fracture is noted. Ligaments Suboptimally assessed by CT. Muscles and Tendons No intramuscular hemorrhage.  No fatty atrophy. Soft tissues Unremarkable. IMPRESSION: Moderate suprapatellar lipohemarthrosis  secondary to an acute central lateral tibial plateau fracture measuring 2.4 x 0.7 x 3.5 cm. A more sagittally oriented peripheral fractures seen through the lateral tibial epiphysis and metaphysis. Electronically Signed   By: Ashley Royalty M.D.   On: 07/23/2016 22:01   Dg Knee Complete 4 Views Left  Result Date: 07/23/2016 CLINICAL DATA:  Leg got caught in dog leash.  Unable to bend knee. EXAM: LEFT KNEE - COMPLETE 4+ VIEW COMPARISON:  LEFT knee radiograph December 06, 2014 FINDINGS: Acute comminuted lateral tibial plateau fractures approximately 6 mm depressed bony fragments. Similar mild tricompartmental osteoarthrosis with marginal spurring and slight lateral subluxation of the tibia with respect to the femur. No destructive bony lesions. Large suprapatellar joint effusion. No subcutaneous gas or radiopaque foreign bodies. IMPRESSION: Acute lateral tibial plateau fracture. Large suprapatellar joint effusion. Electronically Signed   By: Elon Alas M.D.   On: 07/23/2016 20:17    Procedures Procedures (including critical care time)  Medications Ordered in ED Medications - No data  to display   Initial Impression / Assessment and Plan / ED Course  I have reviewed the triage vital signs and the nursing notes.  Pertinent labs & imaging results that were available during my care of the patient were reviewed by me and considered in my medical decision making (see chart for details).     Patient with tibial plateau fracture. Patient will be admitted to orthopedic surgery.  Final Clinical Impressions(s) / ED Diagnoses   Final diagnoses:  Closed fracture of left tibial plateau, initial encounter    New Prescriptions New Prescriptions   No medications on file     Milton Ferguson, MD 07/23/16 2233

## 2016-07-23 NOTE — ED Triage Notes (Signed)
Pt complaining of L knee pain. Pt states dog leash got caught around leg, pulled leg laterally. Pt states unable to bend knee. Pt with strong 3+ pedal pulse, cap refill < 3s., warm to touch. Pt with good CNS.

## 2016-07-23 NOTE — ED Notes (Signed)
Delay in lab draw,  Pt currently in xray. 

## 2016-07-23 NOTE — Consult Note (Signed)
Reason for Consult:LEFT KNEE TIBIAL PLATEAU FRACTURE  Referring Physician: DR. Roderic Palau  Angel Irwin is an 66 y.o. female.   HPI: THE PATIENT IS A 65 Y/O FEMALE WHO SUSTAINED AN INJURY TO HER HER LEFT KNEE THIS EVENING AFTER BEING TANGLED IN A DOG LEASH AND FALLING ONTO THE KNEE. SHE HAS MILD PAIN IN HER LEFT KNEE. THE PAIN IS TOLERABLE AS LONG AS THE KNEE REMAINS STILL.  SHE DENIES CHEST PAIN, SHORTNESS OF BREATH, NUMBNESS, TINGLING, CALF PAIN, FEVER, CHILLS, NAUSEA, VOMITING, OR DIARRHEA,  SHE STATES THAT HER LAST MEAL WAS AROUND NOON TODAY.    Past Medical History:  Diagnosis Date  . Adenomatous colon polyp 09/04/2014  . Asthma    albuterol-rescue inhaler-uses 1-2 x year  . [name] lesion, chronic    on sm bowel capsule 06/2014  . Complication of anesthesia    slow to awaken  . Fundic gland polyps of stomach, benign   . GERD (gastroesophageal reflux disease)    no meds  . Hiatal hernia   . 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 ANTERIOR AND POSTERIOR REPAIR  02/22/2011   Procedure: ANTERIOR (CYSTOCELE) AND POSTERIOR REPAIR (RECTOCELE);  Surgeon: Delice Lesch, MD;  Location: [facility];  Service: Gynecology;  Laterality: N/A;  . COLONOSCOPY    . CYSTOSCOPY  02/22/2011   Procedure: CYSTOSCOPY;  Surgeon: Delice Lesch, MD;  Location: [facility];  Service: Gynecology;  Laterality: N/A;  . DILATION AND CURETTAGE OF UTERUS  1980  . SALPINGOOPHORECTOMY  02/22/2011   Procedure: SALPINGO OOPHERECTOMY;  Surgeon: Delice Lesch, MD;  Location: [facility];  Service: Gynecology;  Laterality: Bilateral;  . VAGINAL HYSTERECTOMY  02/22/2011   Procedure: HYSTERECTOMY VAGINAL;  Surgeon: Delice Lesch, MD;  Location: [facility];  Service: Gynecology;  Laterality: N/A;  . WISDOM TOOTH EXTRACTION      Family History  Problem Relation Age of Onset  . Diabetes Father   . Lung cancer Sister 74  .  Heart disease Maternal Grandmother   . Heart disease Maternal Grandfather   . Kidney disease Maternal Grandfather   . Colon cancer Neg Hx   . Colon polyps Neg Hx   . Esophageal cancer Neg Hx   . Gallbladder disease Neg Hx     Social History:  reports that she has never smoked. She has never used smokeless tobacco. She reports that she drinks alcohol. She reports that she does not use drugs.  Allergies: No Known Allergies  Medications: I have reviewed the patient's current medications.  No results found for this or any previous visit (from the past 48 hour(s)).  Ct Knee Left Wo Contrast  Result Date: 07/23/2016 CLINICAL DATA:  Left knee pain after fall onto concrete. EXAM: CT OF THE LEFT KNEE WITHOUT CONTRAST TECHNIQUE: Multidetector CT imaging of the knee was performed according to the standard protocol. Multiplanar CT image reconstructions were also generated. COMPARISON:  None. FINDINGS: Bones/Joint/Cartilage Acute, lateral tibial plateau fracture measuring 2.4 cm transverse by 0.7 cm in depth by 3.5 cm AP. A sagittally oriented peripheral fracture is seen in addition to the central compression through the lateral tibial physis and metaphysis. Associated lipohemarthrosis. Spurring is noted about the femorotibial compartment involving the femoral condyles, lateral tibial plateau tibial spine. The proximal fibula is intact. All femoral fracture is noted. Ligaments Suboptimally assessed by CT. Muscles and Tendons No intramuscular hemorrhage.  No  fatty atrophy. Soft tissues Unremarkable. IMPRESSION: Moderate suprapatellar lipohemarthrosis secondary to an acute central lateral tibial plateau fracture measuring 2.4 x 0.7 x 3.5 cm. A more sagittally oriented peripheral fractures seen through the lateral tibial epiphysis and metaphysis. Electronically Signed   By: Ashley Royalty M.D.   On: 07/23/2016 22:01   Dg Knee Complete 4 Views Left  Result Date: 07/23/2016 CLINICAL DATA:  Leg got caught in dog  leash.  Unable to bend knee. EXAM: LEFT KNEE - COMPLETE 4+ VIEW COMPARISON:  LEFT knee radiograph December 06, 2014 FINDINGS: Acute comminuted lateral tibial plateau fractures approximately 6 mm depressed bony fragments. Similar mild tricompartmental osteoarthrosis with marginal spurring and slight lateral subluxation of the tibia with respect to the femur. No destructive bony lesions. Large suprapatellar joint effusion. No subcutaneous gas or radiopaque foreign bodies. IMPRESSION: Acute lateral tibial plateau fracture. Large suprapatellar joint effusion. Electronically Signed   By: Elon Alas M.D.   On: 07/23/2016 20:17    ROS  NO RECENT ILLNESSES OR HOSPITALIZATIONS  Blood pressure 116/72, pulse 63, temperature 98.7 F (37.1 C), temperature source Oral, resp. rate 20, SpO2 97 %. Physical Exam  Physical Exam  General Appearance:  Alert, cooperative, no distress, appears stated age  Head:  Normocephalic, without obvious abnormality, atraumatic  Eyes:  Pupils equal, conjunctiva/corneas clear,         Throat: Lips, mucosa, and tongue normal; teeth and gums normal  Neck: No visible masses     Lungs:   respirations unlabored  Chest Wall:  No tenderness or deformity  Heart:  Regular rate and rhythm,  Abdomen:   Soft, non-tender,         Extremities: LLE: SWELLING WITH NO ERYTHEMA, ECCHYMOSIS, OR OPEN WOUNDS. TENDERNESS TO PALPATION OF THE LEFT KNEE. LIMITED RANGE OF MOTION AT THE KNEE. PULSES 2+. GROSSLY NEUROVASCULARLY INTACT. SENSATION INTACT TO LIGHT TOUCH. SHE IS ABLE TO WIGGLE HER TOES WITH NO DIFFICULTY.   Pulses: 2+ and symmetric  Skin: Skin color, texture, turgor normal, no rashes or lesions     Neurologic: Normal    Assessment/Plan: LEFT TIBIAL PLATEAU FRACTURE -DISCUSSED THIS TYPE OF FRACTURE WITH THE PATIENT. REVIEWED THE RATIONALE FOR SURGICAL INTERVENTION.  -DISCUSSED THE SURGICAL PROCEDURE, INCLUDING RISKS VERSUS BENEFITS, AND THE POST-OPERATIVE RECOVERY PROCESS.  -THE  PATIENT WILL BE ADMITTED TO [facility] -TO OR TOMORROW FOR LLE EXTERNAL FIXATOR PLACEMENT.  -WILL DISCUSS CASE WITH DR. HANDY MONDAY AM -PRE-OP ORDERS PLACED -ALL QUESTIONS AND CONCERNS WERE ENCOURAGED AND ADDRESSED.  -THE PATIENT VOICED UNDERSTANDING AND AGREEMENT WITH THE PLAN.  Brynda Peon 07/23/2016, 10:33 PM

## 2016-07-24 ENCOUNTER — Encounter (HOSPITAL_COMMUNITY): Payer: Self-pay | Admitting: Certified Registered Nurse Anesthetist

## 2016-07-24 ENCOUNTER — Inpatient Hospital Stay (HOSPITAL_COMMUNITY): Payer: BLUE CROSS/BLUE SHIELD

## 2016-07-24 ENCOUNTER — Other Ambulatory Visit: Payer: Self-pay

## 2016-07-24 ENCOUNTER — Inpatient Hospital Stay (HOSPITAL_COMMUNITY): Payer: BLUE CROSS/BLUE SHIELD | Admitting: Certified Registered Nurse Anesthetist

## 2016-07-24 ENCOUNTER — Encounter (HOSPITAL_COMMUNITY)
Admission: EM | Disposition: A | Discharge status: Home Health | Payer: Self-pay | Source: Home / Self Care | Attending: Orthopedic Surgery

## 2016-07-24 HISTORY — PX: EXTERNAL FIXATION LEG: SHX1549

## 2016-07-24 LAB — SURGICAL PCR SCREEN
MRSA, PCR: NEGATIVE
STAPHYLOCOCCUS AUREUS: NEGATIVE

## 2016-07-24 LAB — ABO/RH: ABO/RH(D): O POS

## 2016-07-24 LAB — NO BLOOD PRODUCTS

## 2016-07-24 LAB — TYPE AND SCREEN
ABO/RH(D): O POS
ANTIBODY SCREEN: NEGATIVE

## 2016-07-24 SURGERY — EXTERNAL FIXATION, LOWER EXTREMITY
Anesthesia: General | Laterality: Left

## 2016-07-24 MED ORDER — OXYCODONE HCL 5 MG PO TABS: 5.0000 mg | ORAL_TABLET | Freq: Once | ORAL | Status: DC | PRN

## 2016-07-24 MED ORDER — FENTANYL CITRATE (PF) 100 MCG/2ML IJ SOLN
25.0000 ug | INTRAMUSCULAR | Status: DC | PRN
  Administered 2016-07-24 (×2): 50 ug via INTRAVENOUS

## 2016-07-24 MED ORDER — CHLORHEXIDINE GLUCONATE 4 % EX LIQD: 60.0000 mL | Freq: Once | CUTANEOUS | Status: DC

## 2016-07-24 MED ORDER — FENTANYL CITRATE (PF) 250 MCG/5ML IJ SOLN
INTRAMUSCULAR | Status: AC
  Filled 2016-07-24: qty 5

## 2016-07-24 MED ORDER — METOCLOPRAMIDE HCL 5 MG/ML IJ SOLN
5.0000 mg | Freq: Three times a day (TID) | INTRAMUSCULAR | Status: DC | PRN
  Administered 2016-07-24: 10 mg via INTRAVENOUS
  Filled 2016-07-24: qty 2

## 2016-07-24 MED ORDER — CEFAZOLIN SODIUM-DEXTROSE 2-4 GM/100ML-% IV SOLN
INTRAVENOUS | Status: AC
  Filled 2016-07-24: qty 100

## 2016-07-24 MED ORDER — ONDANSETRON HCL 4 MG/2ML IJ SOLN
INTRAMUSCULAR | Status: DC | PRN
  Administered 2016-07-24: 4 mg via INTRAVENOUS

## 2016-07-24 MED ORDER — PHENYLEPHRINE HCL 10 MG/ML IJ SOLN
INTRAMUSCULAR | Status: DC | PRN
  Administered 2016-07-24 (×2): 80 ug via INTRAVENOUS

## 2016-07-24 MED ORDER — EPHEDRINE SULFATE 50 MG/ML IJ SOLN
INTRAMUSCULAR | Status: DC | PRN
  Administered 2016-07-24 (×4): 10 mg via INTRAVENOUS

## 2016-07-24 MED ORDER — EPHEDRINE 5 MG/ML INJ
INTRAVENOUS | Status: AC
  Filled 2016-07-24: qty 10

## 2016-07-24 MED ORDER — KETOROLAC TROMETHAMINE 15 MG/ML IJ SOLN
7.5000 mg | Freq: Four times a day (QID) | INTRAMUSCULAR | Status: AC
  Administered 2016-07-24 – 2016-07-25 (×4): 7.5 mg via INTRAVENOUS
  Filled 2016-07-24 (×2): qty 1

## 2016-07-24 MED ORDER — SODIUM CHLORIDE 0.9 % IR SOLN
Status: DC | PRN
  Administered 2016-07-24: 1000 mL

## 2016-07-24 MED ORDER — LIDOCAINE HCL (CARDIAC) 10 MG/ML IV SOLN
INTRAVENOUS | Status: DC | PRN
  Administered 2016-07-24: 50 mg via INTRAVENOUS

## 2016-07-24 MED ORDER — ENOXAPARIN SODIUM 40 MG/0.4ML ~~LOC~~ SOLN
40.0000 mg | SUBCUTANEOUS | Status: DC
  Administered 2016-07-25 – 2016-07-26 (×2): 40 mg via SUBCUTANEOUS
  Filled 2016-07-24 (×2): qty 0.4

## 2016-07-24 MED ORDER — PROPOFOL 10 MG/ML IV BOLUS
INTRAVENOUS | Status: AC
  Filled 2016-07-24: qty 20

## 2016-07-24 MED ORDER — MIDAZOLAM HCL 2 MG/2ML IJ SOLN
INTRAMUSCULAR | Status: AC
  Filled 2016-07-24: qty 2

## 2016-07-24 MED ORDER — LACTATED RINGERS IV SOLN
INTRAVENOUS | Status: DC
Start: 2016-07-24 — End: 2016-07-26
  Administered 2016-07-24 (×2): via INTRAVENOUS

## 2016-07-24 MED ORDER — FENTANYL CITRATE (PF) 100 MCG/2ML IJ SOLN
INTRAMUSCULAR | Status: AC
  Administered 2016-07-24: 50 ug via INTRAVENOUS
  Filled 2016-07-24: qty 2

## 2016-07-24 MED ORDER — OXYCODONE HCL 5 MG/5ML PO SOLN: 5.0000 mg | Freq: Once | ORAL | Status: DC | PRN

## 2016-07-24 MED ORDER — MIDAZOLAM HCL 5 MG/5ML IJ SOLN
INTRAMUSCULAR | Status: DC | PRN
  Administered 2016-07-24: 2 mg via INTRAVENOUS

## 2016-07-24 MED ORDER — LIDOCAINE 2% (20 MG/ML) 5 ML SYRINGE
INTRAMUSCULAR | Status: AC
  Filled 2016-07-24: qty 5

## 2016-07-24 MED ORDER — ONDANSETRON HCL 4 MG/2ML IJ SOLN: 4.0000 mg | Freq: Once | INTRAMUSCULAR | Status: DC | PRN

## 2016-07-24 MED ORDER — PROPOFOL 10 MG/ML IV BOLUS
INTRAVENOUS | Status: DC | PRN
  Administered 2016-07-24: 100 mg via INTRAVENOUS

## 2016-07-24 MED ORDER — FENTANYL CITRATE (PF) 100 MCG/2ML IJ SOLN
INTRAMUSCULAR | Status: DC | PRN
  Administered 2016-07-24: 100 ug via INTRAVENOUS
  Administered 2016-07-24: 50 ug via INTRAVENOUS
  Administered 2016-07-24: 100 ug via INTRAVENOUS

## 2016-07-24 MED ORDER — KETOROLAC TROMETHAMINE 15 MG/ML IJ SOLN
INTRAMUSCULAR | Status: AC
  Filled 2016-07-24: qty 1

## 2016-07-24 MED ORDER — METOCLOPRAMIDE HCL 5 MG PO TABS: 5.0000 mg | ORAL_TABLET | Freq: Three times a day (TID) | ORAL | Status: DC | PRN

## 2016-07-24 MED ORDER — PHENYLEPHRINE 40 MCG/ML (10ML) SYRINGE FOR IV PUSH (FOR BLOOD PRESSURE SUPPORT)
PREFILLED_SYRINGE | INTRAVENOUS | Status: AC
  Filled 2016-07-24: qty 10

## 2016-07-24 MED ORDER — ONDANSETRON HCL 4 MG/2ML IJ SOLN
INTRAMUSCULAR | Status: AC
  Filled 2016-07-24: qty 2

## 2016-07-24 MED ORDER — CEFAZOLIN SODIUM-DEXTROSE 2-4 GM/100ML-% IV SOLN
2.0000 g | INTRAVENOUS | Status: AC
  Administered 2016-07-24: 2 g via INTRAVENOUS

## 2016-07-24 SURGICAL SUPPLY — 56 items
BANDAGE ACE 4X5 VEL STRL LF (GAUZE/BANDAGES/DRESSINGS) ×2 IMPLANT
BANDAGE ACE 6X5 VEL STRL LF (GAUZE/BANDAGES/DRESSINGS) ×2 IMPLANT
BAR EXFX 250X11 NS LF (EXFIX) ×1
BAR EXFX 500X11 NS LF (EXFIX) ×2
BAR GLASS FIBER EXFX 11X250 (EXFIX) ×2 IMPLANT
BAR GLASS FIBER EXFX 11X500 (EXFIX) ×4 IMPLANT
BLADE SURG 10 STRL SS (BLADE) ×3 IMPLANT
BNDG COHESIVE 4X5 TAN STRL (GAUZE/BANDAGES/DRESSINGS) ×3 IMPLANT
BNDG COHESIVE 6X5 TAN STRL LF (GAUZE/BANDAGES/DRESSINGS) ×3 IMPLANT
BNDG GAUZE ELAST 4 BULKY (GAUZE/BANDAGES/DRESSINGS) ×2 IMPLANT
CANISTER SUCT 3000ML PPV (MISCELLANEOUS) ×3 IMPLANT
CHLORAPREP W/TINT 26ML (MISCELLANEOUS) ×3 IMPLANT
CLAMP BLUE BAR TO BAR (EXFIX) ×4 IMPLANT
COVER SURGICAL LIGHT HANDLE (MISCELLANEOUS) ×3 IMPLANT
CUFF TOURNIQUET SINGLE 34IN LL (TOURNIQUET CUFF) ×3 IMPLANT
CUFF TOURNIQUET SINGLE 44IN (TOURNIQUET CUFF) IMPLANT
DRAPE C-ARM 42X72 X-RAY (DRAPES) ×3 IMPLANT
DRAPE U-SHAPE 47X51 STRL (DRAPES) ×3 IMPLANT
DRSG ADAPTIC 3X8 NADH LF (GAUZE/BANDAGES/DRESSINGS) IMPLANT
DRSG PAD ABDOMINAL 8X10 ST (GAUZE/BANDAGES/DRESSINGS) IMPLANT
ELECT REM PT RETURN 9FT ADLT (ELECTROSURGICAL) ×3
ELECTRODE REM PT RTRN 9FT ADLT (ELECTROSURGICAL) ×1 IMPLANT
GAUZE SPONGE 4X4 12PLY STRL (GAUZE/BANDAGES/DRESSINGS) IMPLANT
GAUZE XEROFORM 5X9 LF (GAUZE/BANDAGES/DRESSINGS) ×2 IMPLANT
GLOVE BIO SURGEON STRL SZ8 (GLOVE) ×3 IMPLANT
GLOVE BIOGEL PI IND STRL 8 (GLOVE) ×2 IMPLANT
GLOVE BIOGEL PI INDICATOR 8 (GLOVE) ×4
GLOVE ECLIPSE 8.0 STRL XLNG CF (GLOVE) ×3 IMPLANT
GOWN STRL REUS W/ TWL LRG LVL3 (GOWN DISPOSABLE) ×1 IMPLANT
GOWN STRL REUS W/ TWL XL LVL3 (GOWN DISPOSABLE) ×2 IMPLANT
GOWN STRL REUS W/TWL LRG LVL3 (GOWN DISPOSABLE) ×3
GOWN STRL REUS W/TWL XL LVL3 (GOWN DISPOSABLE) ×6
HALF PIN 5.0X160 (EXFIX) ×4 IMPLANT
KIT BASIN OR (CUSTOM PROCEDURE TRAY) ×3 IMPLANT
KIT ROOM TURNOVER OR (KITS) ×3 IMPLANT
NEEDLE 22X1 1/2 (OR ONLY) (NEEDLE) IMPLANT
NS IRRIG 1000ML POUR BTL (IV SOLUTION) ×3 IMPLANT
PACK ORTHO EXTREMITY (CUSTOM PROCEDURE TRAY) ×3 IMPLANT
PAD ARMBOARD 7.5X6 YLW CONV (MISCELLANEOUS) ×6 IMPLANT
PAD CAST 4YDX4 CTTN HI CHSV (CAST SUPPLIES) ×1 IMPLANT
PADDING CAST COTTON 4X4 STRL (CAST SUPPLIES) ×3
PIN CLAMP 2BAR 75MM BLUE (EXFIX) ×4 IMPLANT
PIN HALF YELLOW 5X160X35 (EXFIX) ×4 IMPLANT
SOAP 2 % CHG 4 OZ (WOUND CARE) ×3 IMPLANT
STAPLER VISISTAT 35W (STAPLE) IMPLANT
SUCTION FRAZIER HANDLE 10FR (MISCELLANEOUS)
SUCTION TUBE FRAZIER 10FR DISP (MISCELLANEOUS) IMPLANT
SUT PROLENE 3 0 PS 2 (SUTURE) ×3 IMPLANT
SUT VIC AB 3-0 PS2 18 (SUTURE) ×3
SUT VIC AB 3-0 PS2 18XBRD (SUTURE) ×1 IMPLANT
SYR CONTROL 10ML LL (SYRINGE) IMPLANT
TOWEL OR 17X24 6PK STRL BLUE (TOWEL DISPOSABLE) ×3 IMPLANT
TOWEL OR 17X26 10 PK STRL BLUE (TOWEL DISPOSABLE) ×3 IMPLANT
TUBE CONNECTING 12'X1/4 (SUCTIONS)
TUBE CONNECTING 12X1/4 (SUCTIONS) IMPLANT
WATER STERILE IRR 1000ML POUR (IV SOLUTION) ×3 IMPLANT

## 2016-07-24 NOTE — Progress Notes (Signed)
Patient transferred from ED

## 2016-07-24 NOTE — Anesthesia Procedure Notes (Signed)
Procedure Name: LMA Insertion Date/Time: 07/24/2016 10:29 AM Performed by: Rogers Blocker Pre-anesthesia Checklist: Patient identified, Emergency Drugs available, Suction available, Patient being monitored and Timeout performed Patient Re-evaluated:Patient Re-evaluated prior to inductionOxygen Delivery Method: Circle system utilized Preoxygenation: Pre-oxygenation with 100% oxygen Intubation Type: IV induction Ventilation: Mask ventilation without difficulty LMA: LMA inserted LMA Size: 5.0 Number of attempts: 1 Tube secured with: Tape Dental Injury: Teeth and Oropharynx as per pre-operative assessment

## 2016-07-24 NOTE — Transfer of Care (Signed)
Immediate Anesthesia Transfer of Care Note  Patient: Angel Irwin  Procedure(s) Performed: Procedure(s): EXTERNAL FIXATION LEFT LOWER LEG (Left)  Patient Location: PACU  Anesthesia Type:General  Level of Consciousness: awake, alert , oriented and patient cooperative  Airway & Oxygen Therapy: Patient Spontanous Breathing and Patient connected to face mask oxygen  Post-op Assessment: Report given to RN, Post -op Vital signs reviewed and stable and Patient moving all extremities X 4  Post vital signs: Reviewed and stable  Last Vitals:  Vitals:   07/24/16 0029 07/24/16 0519  BP: (!) 110/46 (!) 103/50  Pulse: 68 61  Resp:    Temp: 36.6 C 36.6 C    Last Pain:  Vitals:   07/24/16 0619  TempSrc:   PainSc: 5          Complications: No apparent anesthesia complications

## 2016-07-24 NOTE — Brief Op Note (Signed)
07/23/2016 - 07/24/2016  11:47 AM  PATIENT:  [name]  66 y.o. female  PRE-OPERATIVE DIAGNOSIS:  left tibial plateau fracture  POST-OPERATIVE DIAGNOSIS:  left tibial plateau fracture  PROCEDURE:  Procedure(s): EXTERNAL FIXATION LEFT LOWER LEG (Left)  SURGEON:  Surgeon(s) and Role:    * Rod Can, MD - Primary  PHYSICIAN ASSISTANT: none  ASSISTANTS: staff   ANESTHESIA:   general  EBL:  Total I/O In: 1100 [I.V.:1100] Out: 50 [Blood:50]  BLOOD ADMINISTERED:none  DRAINS: none   LOCAL MEDICATIONS USED:  NONE  SPECIMEN:  No Specimen  DISPOSITION OF SPECIMEN:  N/A  COUNTS:  YES  TOURNIQUET:  * No tourniquets in log *  DICTATION: .Other Dictation: Dictation Number [id number]  PLAN OF CARE: Admit to inpatient   PATIENT DISPOSITION:  PACU - hemodynamically stable.   Delay start of Pharmacological VTE agent (>24hrs) due to surgical blood loss or risk of bleeding: no

## 2016-07-24 NOTE — Op Note (Signed)
NAMEKARA, MELCHING NO.:  [id number]  MEDICAL RECORD NO.:  [id number]  LOCATION:  [facility]                         FACILITY:  [facility]  PHYSICIAN:  Rod Can, MD     DATE OF BIRTH:  1950-12-21  DATE OF PROCEDURE:  07/24/2016 DATE OF DISCHARGE:                              OPERATIVE REPORT   SURGEON:  Rod Can, M.D.  ASSISTANT:  Staff.  PREOPERATIVE DIAGNOSIS:  Left lateral tibial plateau fracture.  POSTOPERATIVE DIAGNOSIS:  Left lateral tibial plateau fracture.  PROCEDURE PERFORMED:  1. Closed reduction and placement of spanning, left lower extremity, external fixator. 2. Interpretation of fluoroscopic images.   IMPLANTS:  Zimmer Ex-Fix.  ESTIMATED BLOOD LOSS:  Minimal.  ANESTHESIA:  General.  ANTIBIOTICS:  2 g Ancef.  COMPLICATIONS:  None.  TUBES AND DRAINS:  None.  DISPOSITION:  Stable to PACU.  INDICATIONS:  The patient is a 66 year old female, tripped over a dog leash and fell on her left knee last night.  She had left knee pain and inability to weight bear.  X-rays in the Emergency Department revealed a left lateral tibial plateau fracture, split depression pattern with very significant depression of the lateral articular surface.  Her knee was grossly unstable.  We admitted her to the hospital for compartment checks.  We discussed the risks, benefits, and alternatives to placement of a spanning external fixator as a temporizing measure.  She understands that she is going to need definitive fixation in the future.  DESCRIPTION OF PROCEDURE:  I identified the patient in the holding area. Surgical site was marked.  She was taken to the operating room.  General anesthesia was induced.  She was transferred to the bed.  Left lower extremity was prepped and draped in normal sterile surgical fashion. Time-out was called verifying the side and site of surgery.  I began by making a stab incision, well proximal to the knee over the  anterior aspect of the femur.  I placed a 5 mm Ex-Fix pin bicortically.  A second pin was placed proximally and a connector was added to the pins.  I then turned my attention to the tibia, well distal to where any future fixation would be, I placed 2 pins using a similar technique.  I checked the position of the pins on AP and lateral fluoroscopy views.  The leg was then brought out into extension and the Ex-Fix was assembled with bars.  Manual traction was held.  All the connections were tightened.  I then placed a bar-to-bar connector to stiffen the construct.  Final AP and lateral fluoroscopy views were used to confirm alignment of the knee.  The pin sites were dressed with Xeroform and Kerlix.  A bulky dressing was applied to the knee with cast padding and an Ace wrap.  The patient was then extubated and taken to PACU in stable condition. Sponge, needle, and instrument counts were correct at the end of the case x2.  Postoperatively, we will readmit the patient to the hospital.  She will be nonweightbearing in the left lower extremity.  She will have compartment checks.  We will put her on Lovenox for DVT prophylaxis.  I will discuss the patient's definitive  fixation with Dr. Marcelino Scot on Monday.          ______________________________ Rod Can, MD     BS/MEDQ  D:  07/24/2016  T:  07/24/2016  Job:  [id number]

## 2016-07-24 NOTE — Anesthesia Preprocedure Evaluation (Signed)
Anesthesia Evaluation  Patient identified by MRN, date of birth, ID band Patient awake    Reviewed: Allergy & Precautions, NPO status , Patient's Chart, lab work & pertinent test results  Airway Mallampati: II  TM Distance: >3 FB Neck ROM: Full    Dental  (+) Teeth Intact, Dental Advisory Given   Pulmonary    breath sounds clear to auscultation       Cardiovascular  Rhythm:Regular Rate:Normal     Neuro/Psych    GI/Hepatic   Endo/Other    Renal/GU      Musculoskeletal   Abdominal   Peds  Hematology   Anesthesia Other Findings   Reproductive/Obstetrics                             Anesthesia Physical Anesthesia Plan  ASA: II  Anesthesia Plan: General   Post-op Pain Management:    Induction: Intravenous  Airway Management Planned: LMA  Additional Equipment:   Intra-op Plan:   Post-operative Plan:   Informed Consent: I have reviewed the patients History and Physical, chart, labs and discussed the procedure including the risks, benefits and alternatives for the proposed anesthesia with the patient or authorized representative who has indicated his/her understanding and acceptance.   Dental advisory given  Plan Discussed with: CRNA and Anesthesiologist  Anesthesia Plan Comments:         Anesthesia Quick Evaluation

## 2016-07-24 NOTE — H&P (Signed)
CC: LEFT KNEE TIBIAL PLATEAU FRACTURE  Referring Physician: DR. Roderic Palau  Angel Irwin is an 66 y.o. female.   HPI: THE PATIENT IS A 65 Y/O FEMALE WHO SUSTAINED AN INJURY TO HER HER LEFT KNEE THIS EVENING AFTER BEING TANGLED IN A DOG LEASH AND FALLING ONTO THE KNEE. SHE HAS MILD PAIN IN HER LEFT KNEE. THE PAIN IS TOLERABLE AS LONG AS THE KNEE REMAINS STILL.  SHE DENIES CHEST PAIN, SHORTNESS OF BREATH, NUMBNESS, TINGLING, CALF PAIN, FEVER, CHILLS, NAUSEA, VOMITING, OR DIARRHEA,  SHE STATES THAT HER LAST MEAL WAS AROUND NOON TODAY.        Past Medical History:  Diagnosis Date  . Adenomatous colon polyp 09/04/2014  . Asthma    albuterol-rescue inhaler-uses 1-2 x year  . Angel Irwin lesion, chronic    on sm bowel capsule 06/2014  . Complication of anesthesia    slow to awaken  . Fundic gland polyps of stomach, benign   . GERD (gastroesophageal reflux disease)    no meds  . Hiatal hernia   . 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 ANTERIOR AND POSTERIOR REPAIR  02/22/2011   Procedure: ANTERIOR (CYSTOCELE) AND POSTERIOR REPAIR (RECTOCELE);  Surgeon: Delice Lesch, MD;  Location: [facility];  Service: Gynecology;  Laterality: N/A;  . COLONOSCOPY    . CYSTOSCOPY  02/22/2011   Procedure: CYSTOSCOPY;  Surgeon: Delice Lesch, MD;  Location: [facility];  Service: Gynecology;  Laterality: N/A;  . DILATION AND CURETTAGE OF UTERUS  1980  . SALPINGOOPHORECTOMY  02/22/2011   Procedure: SALPINGO OOPHERECTOMY;  Surgeon: Delice Lesch, MD;  Location: [facility];  Service: Gynecology;  Laterality: Bilateral;  . VAGINAL HYSTERECTOMY  02/22/2011   Procedure: HYSTERECTOMY VAGINAL;  Surgeon: Delice Lesch, MD;  Location: [facility];  Service: Gynecology;  Laterality: N/A;  . WISDOM TOOTH EXTRACTION           Family History  Problem Relation Age of Onset  . Diabetes Father    . Lung cancer Sister 12  . Heart disease Maternal Grandmother   . Heart disease Maternal Grandfather   . Kidney disease Maternal Grandfather   . Colon cancer Neg Hx   . Colon polyps Neg Hx   . Esophageal cancer Neg Hx   . Gallbladder disease Neg Hx     Social History:  reports that she has never smoked. She has never used smokeless tobacco. She reports that she drinks alcohol. She reports that she does not use drugs.  Allergies: No Known Allergies  Medications: I have reviewed the patient's current medications.  Lab Results Last 48 Hours  No results found for this or any previous visit (from the past 48 hour(s)).     Imaging Results (Last 48 hours)  Ct Knee Left Wo Contrast  Result Date: 07/23/2016 CLINICAL DATA:  Left knee pain after fall onto concrete. EXAM: CT OF THE LEFT KNEE WITHOUT CONTRAST TECHNIQUE: Multidetector CT imaging of the knee was performed according to the standard protocol. Multiplanar CT image reconstructions were also generated. COMPARISON:  None. FINDINGS: Bones/Joint/Cartilage Acute, lateral tibial plateau fracture measuring 2.4 cm transverse by 0.7 cm in depth by 3.5 cm AP. A sagittally oriented peripheral fracture is seen in addition to the central compression through the lateral tibial physis and metaphysis. Associated lipohemarthrosis. Spurring is noted about the femorotibial compartment involving the femoral condyles,  lateral tibial plateau tibial spine. The proximal fibula is intact. All femoral fracture is noted. Ligaments Suboptimally assessed by CT. Muscles and Tendons No intramuscular hemorrhage.  No fatty atrophy. Soft tissues Unremarkable. IMPRESSION: Moderate suprapatellar lipohemarthrosis secondary to an acute central lateral tibial plateau fracture measuring 2.4 x 0.7 x 3.5 cm. A more sagittally oriented peripheral fractures seen through the lateral tibial epiphysis and metaphysis. Electronically Signed   By: Ashley Royalty M.D.   On:  07/23/2016 22:01   Dg Knee Complete 4 Views Left  Result Date: 07/23/2016 CLINICAL DATA:  Leg got caught in dog leash.  Unable to bend knee. EXAM: LEFT KNEE - COMPLETE 4+ VIEW COMPARISON:  LEFT knee radiograph December 06, 2014 FINDINGS: Acute comminuted lateral tibial plateau fractures approximately 6 mm depressed bony fragments. Similar mild tricompartmental osteoarthrosis with marginal spurring and slight lateral subluxation of the tibia with respect to the femur. No destructive bony lesions. Large suprapatellar joint effusion. No subcutaneous gas or radiopaque foreign bodies. IMPRESSION: Acute lateral tibial plateau fracture. Large suprapatellar joint effusion. Electronically Signed   By: Elon Alas M.D.   On: 07/23/2016 20:17     ROS  NO RECENT ILLNESSES OR HOSPITALIZATIONS  Blood pressure 116/72, pulse 63, temperature 98.7 F (37.1 C), temperature source Oral, resp. rate 20, SpO2 97 %. Physical Exam  Physical Exam  General Appearance:  Alert, cooperative, no distress, appears stated age  Head:  Normocephalic, without obvious abnormality, atraumatic  Eyes:  Pupils equal, conjunctiva/corneas clear,         Throat: Lips, mucosa, and tongue normal; teeth and gums normal  Neck: No visible masses     Lungs:   respirations unlabored  Chest Wall:  No tenderness or deformity  Heart:  Regular rate and rhythm,  Abdomen:   Soft, non-tender,         Extremities: LLE: SWELLING WITH NO ERYTHEMA, ECCHYMOSIS, OR OPEN WOUNDS. TENDERNESS TO PALPATION OF THE LEFT KNEE. LIMITED RANGE OF MOTION AT THE KNEE. PULSES 2+. GROSSLY NEUROVASCULARLY INTACT. SENSATION INTACT TO LIGHT TOUCH. SHE IS ABLE TO WIGGLE HER TOES WITH NO DIFFICULTY.   Pulses: 2+ and symmetric  Skin: Skin color, texture, turgor normal, no rashes or lesions     Neurologic: Normal   COMPARTMENTS SOFT NO PAIN WITH PASSIVE STRETCH   Assessment/Plan: LEFT TIBIAL PLATEAU FRACTURE -DISCUSSED THIS TYPE OF FRACTURE  WITH THE PATIENT. REVIEWED THE RATIONALE FOR SURGICAL INTERVENTION.  -DISCUSSED THE SURGICAL PROCEDURE, INCLUDING RISKS VERSUS BENEFITS, AND THE POST-OPERATIVE RECOVERY PROCESS.  -THE PATIENT WILL BE ADMITTED TO THE HOSPITAL FOR COMPARTMENT CHECKS -TO OR TOMORROW FOR LLE EXTERNAL FIXATOR PLACEMENT.  -WILL DISCUSS CASE WITH DR. HANDY MONDAY AM -PRE-OP ORDERS PLACED -ALL QUESTIONS AND CONCERNS WERE ENCOURAGED AND ADDRESSED.  -THE PATIENT VOICED UNDERSTANDING AND AGREEMENT WITH THE PLAN.

## 2016-07-24 NOTE — Interval H&P Note (Signed)
History and Physical Interval Note:  07/24/2016 10:13 AM  Angel Irwin  has presented today for surgery, with the diagnosis of left tibial plateau fracture  The various methods of treatment have been discussed with the patient and family. After consideration of risks, benefits and other options for treatment, the patient has consented to  Procedure(s): EXTERNAL FIXATION LEFT LOWER LEG (Left) as a surgical intervention .  The patient's history has been reviewed, patient examined, no change in status, stable for surgery.  I have reviewed the patient's chart and labs.  Questions were answered to the patient's satisfaction.     Melrose Kearse, Horald Pollen

## 2016-07-24 NOTE — Anesthesia Postprocedure Evaluation (Addendum)
Anesthesia Post Note  Patient: Angel Irwin  Procedure(s) Performed: Procedure(s) (LRB): EXTERNAL FIXATION LEFT LOWER LEG (Left)  Patient location during evaluation: PACU Anesthesia Type: General Level of consciousness: awake, awake and alert and oriented Pain management: pain level controlled Vital Signs Assessment: post-procedure vital signs reviewed and stable Respiratory status: spontaneous breathing, nonlabored ventilation and respiratory function stable Cardiovascular status: blood pressure returned to baseline Anesthetic complications: no       Last Vitals:  Vitals:   07/24/16 1319 07/24/16 1708  BP: 115/62 (!) 94/49  Pulse: (!) 48 68  Resp: 15 17  Temp: 36.4 C 36.4 C    Last Pain:  Vitals:   07/24/16 2013  TempSrc:   PainSc: 6                  Skii Cleland COKER

## 2016-07-25 NOTE — Progress Notes (Signed)
Subjective: 1 Day Post-Op Procedure(s) (LRB): EXTERNAL FIXATION LEFT LOWER LEG (Left) Patient reports pain as mild.  Reports some burning in the lower leg but pain well controlled. No numbness or tingling. No other c/o.   Objective: Vital signs in last 24 hours: Temp:  [97 F (36.1 C)-98.6 F (37 C)] 98.3 F (36.8 C) (04/08 0400) Pulse Rate:  [48-100] 75 (04/08 0400) Resp:  [11-21] 19 (04/07 2118) BP: (87-129)/(41-75) 103/45 (04/08 0400) SpO2:  [75 %-99 %] 92 % (04/08 0400)  Intake/Output from previous day: 04/07 0701 - 04/08 0700 In: 1340 [P.O.:240; I.V.:1100] Out: 50 [Blood:50] Intake/Output this shift: No intake/output data recorded.   Recent Labs  07/23/16 2317  HGB 12.3    Recent Labs  07/23/16 2317  WBC 8.4  RBC 4.16  HCT 37.6  PLT 222    Recent Labs  07/23/16 2317  NA 138  K 4.0  CL 107  CO2 22  BUN 10  CREATININE 0.66  GLUCOSE 106*  CALCIUM 9.8   No results for input(s): LABPT, INR in the last 72 hours.  Neurologically intact ABD soft Neurovascular intact Sensation intact distally Intact pulses distally Dorsiflexion/Plantar flexion intact Incision: dressing C/D/I and no drainage No cellulitis present Compartment soft no calf pain or sign of DVT  Assessment/Plan: 1 Day Post-Op Procedure(s) (LRB): EXTERNAL FIXATION LEFT LOWER LEG (Left) Advance diet Up with therapy D/C IV fluids  Remain NWB LLE Lovenox for DVT ppx Dr. Lyla Glassing to discuss definitive tx plan with Dr. Marcelino Scot tomorrow  Phoebe Sharps, Ellison Hughs M. 07/25/2016, 8:10 AM

## 2016-07-25 NOTE — Evaluation (Signed)
Occupational Therapy Evaluation Patient Details Name: Angel Irwin MRN: [id number] DOB: 1950/06/14 Today's Date: 07/25/2016    History of Present Illness Pti s a 66 yo female admitted through ED following a fall on 07/23/16 when she was tangled up on a dogs leash. Pt sustained a L tibial plateau fx and underwent an external fixation on 07/24/16. PMH significant for asthma, GERD.    Clinical Impression   PTA Pt independent in ADL/IADL and mobility. Pt currently set with with mod A for LB ADL and min A for stand pivot transfer. Please see OT problem list below and ADL section for performance. Pt will benefit from skilled OT in the acute setting to maximize safety and independence in ADL and functional transfers. Pt will require HHOT to ensure return to PLOF.     Follow Up Recommendations  Home health OT;Supervision - Intermittent (children are unable to provide 24 hour support)    Equipment Recommendations  3 in 1 bedside commode    Recommendations for Other Services       Precautions / Restrictions Precautions Precautions: Fall Restrictions Weight Bearing Restrictions: Yes LLE Weight Bearing: Non weight bearing      Mobility Bed Mobility Overal bed mobility: Needs Assistance Bed Mobility: Supine to Sit     Supine to sit: Min assist     General bed mobility comments: Min A to EOB to assist with fixator  Transfers Overall transfer level: Needs assistance Equipment used: None Transfers: Sit to/from [facility] Sit to Stand: Min assist;Min guard;+2 physical assistance Stand pivot transfers: Min assist;Min guard;+2 safety/equipment       General transfer comment: Min A +2 initially for safety, Min guard throughout transfer. pt is able to maintain LLE NWB without assistance.     Balance Overall balance assessment: Needs assistance Sitting-balance support: No upper extremity supported;Feet supported Sitting balance-Leahy Scale: Good     Standing balance  support: Single extremity supported;During functional activity Standing balance-Leahy Scale: Fair                             ADL either performed or assessed with clinical judgement   ADL Overall ADL's : Needs assistance/impaired Eating/Feeding: Modified independent;Sitting   Grooming: Wash/dry hands;Wash/dry face;Oral care;Set up;Sitting;Applying deodorant Grooming Details (indicate cue type and reason): in recliner Upper Body Bathing: Set up;Sitting   Lower Body Bathing: Moderate assistance;Sitting/lateral leans   Upper Body Dressing : Set up   Lower Body Dressing: Maximal assistance;Sit to/from stand   Toilet Transfer: Minimal assistance;+2 for safety/equipment;Stand-pivot;BSC Toilet Transfer Details (indicate cue type and reason): able to maintain NWB without problem Toileting- Clothing Manipulation and Hygiene: Set up;Sit to/from stand Toileting - Clothing Manipulation Details (indicate cue type and reason): managing hospital gown and peri care     Functional mobility during ADLs: Minimal assistance;+2 for safety/equipment;Cueing for sequencing       Vision Baseline Vision/History: Wears glasses Wears Glasses: At all times Patient Visual Report: No change from baseline Vision Assessment?: No apparent visual deficits     Perception     Praxis      Pertinent Vitals/Pain Pain Assessment: 0-10 Pain Score: 3  Pain Location: LLE with mobility Pain Descriptors / Indicators: Aching Pain Intervention(s): Monitored during session;Premedicated before session     Hand Dominance Right   Extremity/Trunk Assessment Upper Extremity Assessment Upper Extremity Assessment: Overall WFL for tasks assessed   Lower Extremity Assessment Lower Extremity Assessment: Defer to PT evaluation LLE Deficits /  Details: pt with limitations related to external fixation across left knee. At least 3/5 ankle and hip per gross functional assessment.  LLE: Unable to fully assess due  to immobilization   Cervical / Trunk Assessment Cervical / Trunk Assessment: Normal   Communication Communication Communication: No difficulties   Cognition Arousal/Alertness: Awake/alert Behavior During Therapy: WFL for tasks assessed/performed Overall Cognitive Status: Within Functional Limits for tasks assessed                                     General Comments       Exercises Exercises: Total Joint Total Joint Exercises Ankle Circles/Pumps: AROM;Both;20 reps;Supine   Shoulder Instructions      Home Living Family/patient expects to be discharged to:: Private residence Living Arrangements: Alone;Other (Comment) (Daisy - under 10 lbs dog) Available Help at Discharge: Family;Available PRN/intermittently (local daughter and son in [place]) Type of Home: Apartment Home Access: Level entry;Other (comment) (curbs in and out of parking lot)     Home Layout: One level     Bathroom Shower/Tub: Corporate investment banker: Standard Bathroom Accessibility: Yes How Accessible: Accessible via walker Home Equipment: None          Prior Functioning/Environment Level of Independence: Independent        Comments: works as Regulatory affairs officer, and in [employer]        OT Problem List: Decreased activity tolerance;Decreased range of motion;Impaired balance (sitting and/or standing);Decreased knowledge of use of DME or AE;Pain      OT Treatment/Interventions: Self-care/ADL training;Therapeutic exercise;DME and/or AE instruction;Therapeutic activities;Patient/family education;Balance training    OT Goals(Current goals can be found in the care plan section) Acute Rehab OT Goals Patient Stated Goal: to get home OT Goal Formulation: With patient Time For Goal Achievement: 08/08/16 Potential to Achieve Goals: Good ADL Goals Pt Will Perform Upper Body Bathing: with modified independence;sitting;with adaptive equipment Pt Will Perform Lower  Body Bathing: with modified independence;sitting/lateral leans Pt Will Transfer to Toilet: with modified independence;ambulating (with RW, on 3 in 1 over commode) Pt Will Perform Toileting - Clothing Manipulation and hygiene: with modified independence;sit to/from stand Pt Will Perform Tub/Shower Transfer: Tub transfer;3 in 1;rolling walker;anterior/posterior transfer  OT Frequency: Min 2X/week (potentially increase after surgery)   Barriers to D/C: Decreased caregiver support  Pt lives alone       Co-evaluation PT/OT/SLP Co-Evaluation/Treatment: Yes Reason for Co-Treatment: For patient/therapist safety;To address functional/ADL transfers PT goals addressed during session: Mobility/safety with mobility OT goals addressed during session: ADL's and self-care      End of Session Equipment Utilized During Treatment: Gait belt Nurse Communication: Mobility status;Weight bearing status  Activity Tolerance: Patient tolerated treatment well Patient left: in chair;with call bell/phone within reach  OT Visit Diagnosis: Other abnormalities of gait and mobility (R26.89);Pain Pain - Right/Left: Left Pain - part of body: Leg                Time: 1100-1138 OT Time Calculation (min): 38 min Charges:  OT General Charges $OT Visit: 1 Procedure OT Evaluation $OT Eval Moderate Complexity: 1 Procedure OT Treatments $Self Care/Home Management : 8-22 mins G-Codes:     Hulda Humphrey OTR/[place] 07/25/2016, 2:50 PM

## 2016-07-25 NOTE — Evaluation (Signed)
Physical Therapy Evaluation Patient Details Name: Angel Irwin MRN: [id number] DOB: 03/02/1951 Today's Date: 07/25/2016   History of Present Illness  Pti s a 66 yo female admitted through ED following a fall on 07/23/16 when she was tangled up on a dogs leash. Pt sustained a L tibial plateau fx and underwent an external fixation on 07/24/16. PMH significant for asthma, GERD.   Clinical Impression  Pt is POD 1 and moving well with therapy. Due to external fixation, visit was performed as a co-treat to address function and mobility. 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 Pt is able to perform mobility this session with Min A to min guard assist for safety. Pt is able to maintain LLE elevated throughout transfers, sitting on commode and is able to perform bed mobility moving LLE EOB. Pt will benefit from continued acute PT services in order to address below deficits prior to discharge to venue recommended below.     Follow Up Recommendations Home health PT    Equipment Recommendations  Rolling walker with 5" wheels;3in1 (PT)    Recommendations for Other Services       Precautions / Restrictions Precautions Precautions: Fall Restrictions Weight Bearing Restrictions: Yes LLE Weight Bearing: Non weight bearing      Mobility  Bed Mobility Overal bed mobility: Needs Assistance Bed Mobility: Supine to Sit     Supine to sit: Min assist     General bed mobility comments: Min A to EOB to assist with fixator  Transfers Overall transfer level: Needs assistance Equipment used: None Transfers: Sit to/from [facility] Sit to Stand: Min assist;Min guard;+2 physical assistance Stand pivot transfers: Min assist;Min guard;+2 safety/equipment       General transfer comment: Min A +2 initially for safety, Min guard throughout transfer. pt is able to maintain LLE NWB without assistance.   Ambulation/Gait             General Gait  Details: Did not attempt this session due to nausea  Stairs            Wheelchair Mobility    Modified Rankin (Stroke Patients Only)       Balance Overall balance assessment: Needs assistance Sitting-balance support: No upper extremity supported;Feet supported Sitting balance-Leahy Scale: Good     Standing balance support: Single extremity supported;During functional activity Standing balance-Leahy Scale: Fair                               Pertinent Vitals/Pain Pain Assessment: 0-10 Pain Score: 3  Pain Location: LLE with mobility Pain Descriptors / Indicators: Aching Pain Intervention(s): Monitored during session;Premedicated before session    Home Living Family/patient expects to be discharged to:: Private residence Living Arrangements: Alone;Other (Comment) (Daisy - under 10 lbs dog) Available Help at Discharge: Family;Available PRN/intermittently (local daughter and son in [place]) Type of Home: Apartment Home Access: Level entry;Other (comment) (curbs in and out of parking lot)     Home Layout: One level Home Equipment: None      Prior Function Level of Independence: Independent         Comments: works as Wellsite geologist   Dominant Hand: Right    Extremity/Trunk Assessment   Upper Extremity Assessment Upper Extremity Assessment: Defer to OT evaluation    Lower Extremity Assessment Lower Extremity Assessment: LLE deficits/detail LLE Deficits / Details: pt with limitations related to external  fixation across left knee. At least 3/5 ankle and hip per gross functional assessment.  LLE: Unable to fully assess due to immobilization    Cervical / Trunk Assessment Cervical / Trunk Assessment: Normal  Communication   Communication: No difficulties  Cognition Arousal/Alertness: Awake/alert Behavior During Therapy: WFL for tasks assessed/performed Overall Cognitive Status: Within Functional Limits for tasks assessed                                         General Comments      Exercises Total Joint Exercises Ankle Circles/Pumps: AROM;Both;20 reps;Supine   Assessment/Plan    PT Assessment Patient needs continued PT services  PT Problem List Decreased strength;Decreased range of motion;Decreased activity tolerance;Decreased balance;Decreased mobility;Decreased knowledge of use of DME;Pain       PT Treatment Interventions DME instruction;Gait training;Functional mobility training;Therapeutic activities;Therapeutic exercise;Balance training    PT Goals (Current goals can be found in the Care Plan section)  Acute Rehab PT Goals Patient Stated Goal: to get home PT Goal Formulation: With patient Time For Goal Achievement: 08/01/16 Potential to Achieve Goals: Good    Frequency Min 5X/week   Barriers to discharge        Co-evaluation PT/OT/SLP Co-Evaluation/Treatment: Yes Reason for Co-Treatment: To address functional/ADL transfers;For patient/therapist safety PT goals addressed during session: Mobility/safety with mobility         End of Session Equipment Utilized During Treatment: Gait belt Activity Tolerance: Patient tolerated treatment well Patient left: in chair;with call bell/phone within reach Nurse Communication: Mobility status PT Visit Diagnosis: Other abnormalities of gait and mobility (R26.89);Pain Pain - Right/Left: Left Pain - part of body: Leg    Time: 7591-6384 PT Time Calculation (min) (ACUTE ONLY): 25 min   Charges:   PT Evaluation $PT Eval Moderate Complexity: 1 Procedure     PT G Codes:        Scheryl Marten PT, DPT  [phone]   Jacqulyn Liner Sloan Leiter 07/25/2016, 1:20 PM

## 2016-07-26 ENCOUNTER — Encounter (HOSPITAL_COMMUNITY): Payer: Self-pay | Admitting: Orthopedic Surgery

## 2016-07-26 MED ORDER — CEFAZOLIN SODIUM-DEXTROSE 2-4 GM/100ML-% IV SOLN
2.0000 g | Freq: Once | INTRAVENOUS | Status: AC
  Administered 2016-07-27: 2 g via INTRAVENOUS
  Filled 2016-07-26: qty 100

## 2016-07-26 MED ORDER — LACTATED RINGERS IV SOLN: INTRAVENOUS | Status: DC

## 2016-07-26 NOTE — Progress Notes (Signed)
qPhysical Therapy Treatment Patient Details Name: Angel Irwin MRN: [id number] DOB: [birth year] Today's Date: 07/26/2016    History of Present Illness Pti s a 66 yo female admitted through ED following a fall on 07/23/16 when she was tangled up on a dogs leash. Pt sustained a L tibial plateau fx and underwent an external fixation on 07/24/16. PMH significant for asthma, GERD.     PT Comments    Patient overall min guard/supervision for mobility. Pt able to negotiate step and maintain NWB L LE throughout session. Current plan remains appropriate.    Follow Up Recommendations  Home health PT     Equipment Recommendations  Rolling walker with 5" wheels;3in1 (PT)    Recommendations for Other Services       Precautions / Restrictions Precautions Precautions: Fall Restrictions Weight Bearing Restrictions: Yes LLE Weight Bearing: Non weight bearing    Mobility  Bed Mobility Overal bed mobility: Modified Independent Bed Mobility: Supine to Sit           General bed mobility comments: increased time/effort; no physical assist or use of bed rails; HOB flat  Transfers Overall transfer level: Needs assistance Equipment used: Rolling walker (2 wheeled) Transfers: Sit to/from [facility] Sit to Stand: Min guard         General transfer comment: min guard for safety; demo of safe hand placement and able to maintian NWB L LE  Ambulation/Gait Ambulation/Gait assistance: Min guard Ambulation Distance (Feet): 30 Feet Assistive device: Rolling walker (2 wheeled) Gait Pattern/deviations: Step-to pattern     General Gait Details: pt able to maintain L LE NWB   Stairs Stairs: Yes   Stair Management: No rails;Step to pattern;Backwards;With walker Number of Stairs: 1 General stair comments: cues for technique; no physical assist needed  Wheelchair Mobility    Modified Rankin (Stroke Patients Only)       Balance Overall balance assessment: Needs  assistance Sitting-balance support: No upper extremity supported;Feet supported Sitting balance-Leahy Scale: Good     Standing balance support: Single extremity supported;During functional activity Standing balance-Leahy Scale: Fair                              Cognition Arousal/Alertness: Awake/alert Behavior During Therapy: WFL for tasks assessed/performed Overall Cognitive Status: Within Functional Limits for tasks assessed                                        Exercises      General Comments        Pertinent Vitals/Pain Pain Assessment: No/denies pain Pain Intervention(s): Monitored during session;Premedicated before session    Home Living                      Prior Function            PT Goals (current goals can now be found in the care plan section) Acute Rehab PT Goals Patient Stated Goal: to get home PT Goal Formulation: With patient Time For Goal Achievement: 08/01/16 Potential to Achieve Goals: Good Progress towards PT goals: Progressing toward goals    Frequency    Min 5X/week      PT Plan Current plan remains appropriate    Co-evaluation             End of Session Equipment Utilized During Treatment: Gait belt  Activity Tolerance: Patient tolerated treatment well Patient left: in chair;with call bell/phone within reach Nurse Communication: Mobility status PT Visit Diagnosis: Other abnormalities of gait and mobility (R26.89);Pain Pain - Right/Left: Left Pain - part of body: Leg     Time: 1886-7737 PT Time Calculation (min) (ACUTE ONLY): 25 min  Charges:  $Gait Training: 8-22 mins $Therapeutic Activity: 8-22 mins                    G Codes:       Earney Navy, PTA Pager: [phone]     Darliss Cheney 07/26/2016, 9:22 AM

## 2016-07-26 NOTE — Care Management Note (Signed)
Case Management Note  Patient Details  Name: Angel Irwin MRN: [id number] Date of Birth: 1951-03-30  Subjective/Objective:  66 yr old female s/p fall at home after getting tangled in dogs leash, resulting in a left tibial plateau fracture. Patient under went closed reduction and application of external fixator.                Action/Plan: Case manager spoke with patient concerning discharge plans post operatively. Patient states her sons and daughter have already begun preparing a schedule to assist her at discharge. Patient was very independent prior to this incident. CM offered choice for [facility], Referral was called to [facility], [facility] Liaison. CM has ordered DME for home. Patient will return to OR on 07/27/16. Case manager will continue to monitor for discharge needs.   Expected Discharge Date:    pending              Expected Discharge Plan:  [place]  In-House Referral:  NA  Discharge planning Services  CM Consult  Post Acute Care Choice:  Durable Medical Equipment, Home Health Choice offered to:     DME Arranged:  3-N-1, Walker rolling DME Agency:  [place]:  PT, OT [facility] Agency:  [facility]  Status of Service:  In process, will continue to follow  If discussed at Long Length of Stay Meetings, dates discussed:    Additional Comments:  Ninfa Meeker, RN 07/26/2016, 12:02 PM

## 2016-07-26 NOTE — Consult Note (Signed)
Orthopaedic Trauma Service (OTS) Consult   Reason for Consult: Comminuted Left tibial plateau fracture  Referring Physician: Geralynn Rile, MD    HPI: Angel Irwin is an 66 y.o. white female who was injured 07/23/2016 while walking family dog. Patient was walking a the pitbull and was on her way back inside when her legs got wrapped up and she fell. Patient twisted her left leg causing her to fall. Patient had immediate onset of pain and inability to bear weight. She was brought to [facility] for evaluation. She was found to have a left tibial plateau fracture. This was her only injury of note. Orthopedics was consult and for admission. Patient was seen and evaluated by Dr. Lyla Glassing. Patient was taken to the operating room for application of a spanning external fixator. Due to the complexity of the injury Dr. Lyla Glassing asserted that definitive management was out of these scope of his practice and requested consultation from fellowship trained orthopedic traumatologist.  Patient seen and evaluated by the orthopedic trauma service on 07/26/2016. She states that she is very comfortable. Pain is well-controlled on Norco. She has been out of bed several times to the chair as tolerated this without difficulty. Patient denies any additional injuries elsewhere. She denies any numbness or tingling in her left lower extremity. Patient has been on Lovenox for DVT and PE prophylaxis since admission.  Patient lives in a single level dwelling.  She has a few stairs to go down in order to gain access to her house. She was not on any assistive walking aids prior to admission. She does live by herself but her family is close by  Patient is employed and works for [employer].   Patient is a Sales promotion account executive Witness   Patient does not smoke. Drinks alcohol on rare occasion   Medications reviewed. Patient does not take any medications for bone health    Patient does report some mild arthritis in her left knee  prior to admission but this did not limit her daily activities and was nonpainful  Past Medical History:  Diagnosis Date  . Adenomatous colon polyp 09/04/2014  . Asthma    albuterol-rescue inhaler-uses 1-2 x year  . [name] lesion, chronic    on sm bowel capsule 06/2014  . Complication of anesthesia    slow to awaken  . Fundic gland polyps of stomach, benign   . GERD (gastroesophageal reflux disease)    no meds  . Hiatal hernia   . 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 ANTERIOR AND POSTERIOR REPAIR  02/22/2011   Procedure: ANTERIOR (CYSTOCELE) AND POSTERIOR REPAIR (RECTOCELE);  Surgeon: Delice Lesch, MD;  Location: [facility];  Service: Gynecology;  Laterality: N/A;  . COLONOSCOPY    . CYSTOSCOPY  02/22/2011   Procedure: CYSTOSCOPY;  Surgeon: Delice Lesch, MD;  Location: [facility];  Service: Gynecology;  Laterality: N/A;  . DILATION AND CURETTAGE OF UTERUS  1980  . EXTERNAL FIXATION LEG Left 07/24/2016   Procedure: EXTERNAL FIXATION LEFT LOWER LEG;  Surgeon: Rod Can, MD;  Location: [facility];  Service: Orthopedics;  Laterality: Left;  . SALPINGOOPHORECTOMY  02/22/2011   Procedure: SALPINGO OOPHERECTOMY;  Surgeon: Delice Lesch, MD;  Location: [facility];  Service: Gynecology;  Laterality: Bilateral;  . VAGINAL HYSTERECTOMY  02/22/2011   Procedure: HYSTERECTOMY VAGINAL;  Surgeon: Delice Lesch, MD;  Location: [facility];  Service: Gynecology;  Laterality: N/A;  . WISDOM TOOTH EXTRACTION      Family History  Problem Relation Age of Onset  . Diabetes Father   . Lung cancer Sister 94  . Heart disease Maternal Grandmother   . Heart disease Maternal Grandfather   . Kidney disease Maternal Grandfather   . Colon cancer Neg Hx   . Colon polyps Neg Hx   . Esophageal cancer Neg Hx   . Gallbladder disease Neg Hx     Social History:  reports that she has never smoked. She has never used  smokeless tobacco. She reports that she drinks alcohol. She reports that she does not use drugs.  Allergies: No Known Allergies  Medications:  I have reviewed the patient's current medications. Prior to Admission:  Prescriptions Prior to Admission  Medication Sig Dispense Refill Last Dose  . albuterol (PROVENTIL HFA;VENTOLIN HFA) 108 (90 BASE) MCG/ACT inhaler Inhale 2 puffs into the lungs every 6 (six) hours as needed for wheezing or shortness of breath. 1 Inhaler 3 2 months ago  . Cyanocobalamin (VITAMIN B-12 PO) Take 1 tablet by mouth daily.   07/23/2016 at Unknown time  . ferrous sulfate 325 (65 FE) MG tablet Take 325 mg by mouth daily with breakfast.   week ago  . Multiple Vitamin (MULTIVITAMIN WITH MINERALS) TABS tablet Take 1 tablet by mouth daily.   07/23/2016 at Unknown time  . naproxen (NAPROSYN) 500 MG tablet Take 500 mg by mouth 2 (two) times daily as needed (pain).   07/23/2016 at am    Results for orders placed or performed during the hospital encounter of 07/23/16 (from the past 48 hour(s))  No blood products     Status: None   Collection Time: 07/24/16  9:26 AM  Result Value Ref Range   Transfuse no blood products      TRANSFUSE NO BLOOD PRODUCTS, VERIFIED BY RN ROBBY B    Dg Knee 1-2 Views Left  Result Date: 07/24/2016 CLINICAL DATA:  External fixation of left lower extremity for lateral tibial plateau fracture. EXAM: DG C-ARM 61-120 MIN; LEFT KNEE - 1-2 VIEW COMPARISON:  07/23/2016 left knee radiographs. FINDINGS: Fluoroscopy time 0 minutes 30 seconds. Two spot fluoroscopic intraoperative nondiagnostic left knee radiographs demonstrate lateral left tibial plateau fracture. Rod overlies the left distal femur on the frontal view. IMPRESSION: Intraoperative fluoroscopic guidance for left lower extremity external fixation for left lateral tibial plateau fracture. Electronically Signed   By: Ilona Sorrel M.D.   On: 07/24/2016 11:42   Dg Knee Left Port  Result Date:  07/24/2016 CLINICAL DATA:  External fixation of the leg. Initial encounter. EXAM: PORTABLE LEFT KNEE - 1-2 VIEW COMPARISON:  Fluoroscopy from earlier today FINDINGS: Located knee. There is a depressed lateral tibial plateau fracture with superimposed marginal spurring. Knee joint effusion with fat component. IMPRESSION: Lateral tibial plateau fracture with external fixation. Stable alignment compared to intraoperative fluoroscopy. Electronically Signed   By: Monte Fantasia M.D.   On: 07/24/2016 13:07   Dg C-arm 1-60 Min  Result Date: 07/24/2016 CLINICAL DATA:  External fixation of left lower extremity for lateral tibial plateau fracture. EXAM: DG C-ARM 61-120 MIN; LEFT KNEE - 1-2 VIEW COMPARISON:  07/23/2016 left knee radiographs. FINDINGS: Fluoroscopy time 0 minutes 30 seconds. Two spot fluoroscopic intraoperative nondiagnostic left knee radiographs demonstrate lateral left tibial plateau fracture. Rod overlies the left distal femur on the frontal view. IMPRESSION: Intraoperative fluoroscopic guidance for left lower extremity external fixation for left lateral tibial plateau fracture. Electronically Signed  By: Ilona Sorrel M.D.   On: 07/24/2016 11:42    Review of Systems  Constitutional: Negative for chills and fever.  Respiratory: Negative for shortness of breath and wheezing.   Cardiovascular: Negative for chest pain and palpitations.  Gastrointestinal: Negative for abdominal pain, nausea and vomiting.  Genitourinary: Negative for dysuria.  Neurological: Negative for tingling and sensory change.   Blood pressure (!) 128/53, pulse 70, temperature 98.4 F (36.9 C), temperature source Oral, resp. rate 16, SpO2 96 %. Physical Exam  Constitutional: She is oriented to person, place, and time. She appears well-developed and well-nourished. No distress.  HENT:  Head: Normocephalic and atraumatic.  Mouth/Throat: Oropharynx is clear and moist and mucous membranes are normal.  Eyes: EOM are normal.   Neck: Normal range of motion.  Cardiovascular: Normal rate, regular rhythm, S1 normal and S2 normal.   Pulmonary/Chest: Effort normal. No respiratory distress. She has no wheezes. She has no rales.  Clear anterior fields   Abdominal: Soft. Bowel sounds are normal. She exhibits no distension.  Soft, NTND  Musculoskeletal:  Left Lower Extremity  Inspection:    Spanning external fixator left knee    Compressive wrap from foot to thigh    Swelling well-controlled distally Bony eval:    Tender to palpation left proximal tibia     Distal femur and patella are nontender    Hip is nontender. No pain with logrolling or axial loading of her hip    Ankle is nontender    Foot is nontender Soft tissue:    Did not remove entire compressive dressing to fully evaluate soft tissue however dressing was partially split around the knee to evaluate her soft tissue. Laterally her soft tissue wrinkles with gentle compression. Did not appreciate any fracture blisters from the soft tissue that was visualized  ROM:    Full active and passive ankle range of motion is noted    Unable to assess knee range of motion as the patient is an external fixator  Sensation:    DPN, SPN, TN sensory functions are grossly intact Motor:    EHL, FHL anterior tibialis, posterior tibialis, peroneals and gastrocsoleus complex motor functions are grossly intact Vascular:    Extremity is warm    + DP pulse    Compartments are soft and nontender, no pain with passive stretching   Left lower extremity  No traumatic wounds, ecchymosis, or rash  Nontender  No effusions             Hip is stable. No pain with axial loading or logrolling  Knee stable to varus/ valgus and anterior/posterior stress              Ankle is stable with evaluation  Sens DPN, SPN, TN intact  Motor EHL, ext, flex, evers 5/5  DP 2+, PT 2+, No significant edema   Bilateral upper extremities             shoulder, elbow, wrist, digits- no skin wounds,  nontender, no instability, no blocks to motion  Sens  Ax/R/M/U intact  Mot   Ax/ R/ PIN/ M/ AIN/ U intact  Rad 2+    Neurological: She is alert and oriented to person, place, and time.  Skin: Skin is warm and dry. Capillary refill takes less than 2 seconds.  Psychiatric: She has a normal mood and affect. Her behavior is normal. Judgment and thought content normal.     Assessment/Plan:  66 year old white female with left lateral tibial plateau fracture  with significant depression  -Comminuted left lateral tibial plateau fracture  Significant joint involvement present  Patient would benefit from surgical intervention to address alignment, stability, evaluate her lateral meniscus has improved surface congruity.  Patient is in agreement with plan.  We did discuss the possibility of needing bone graft substitute to fill and void as created from just impacting the joint fragment. Patient is a Sales promotion account executive Witness but is okay with using cancellous allograft if needed   Patient will be nonweightbearing on her left leg 6-8 weeks. She will have unrestricted range of motion of left knee postoperatively. We will obtain a hinged knee brace as well   Continue with PT/OT  Ice and elevate L leg    We also discussed that this particular injury does place the patient at increased risk of developing symptomatic post dramatic arthritis which may require subsequent interventions including but not limited to intra-articular steroid injections and total knee arthroplasty  - Pain management:  Continue with norco   - ABL anemia/Hemodynamics  Check CBC in am   Pt is a Jehovah's witness  - Medical issues   Asthma- well controlled  - DVT/PE prophylaxis:  Lovenox x 3 weeks post op   - ID:   Periop abx  - Metabolic Bone Disease:  Check basic bone health labs  Recommend DEXA scan as an outpt in 4 weeks or so   - Activity:  NWB L leg  - FEN/GI prophylaxis/Foley/Lines:  Reg diet for now  NPO after  MN   Restart IVF after MN   -Ex-fix/Splint care:  Ok to manipulate Ex fix by bars to move leg   - Dispo:  OR tomorrow for ORIF L tibial plateau and removal of ex fix  Think pt will be ready to dc home on 07/28/2016   Jari Pigg, PA-C [facility] [phone] (P) 07/26/2016, 8:38 AM

## 2016-07-27 ENCOUNTER — Inpatient Hospital Stay (HOSPITAL_COMMUNITY): Payer: BLUE CROSS/BLUE SHIELD

## 2016-07-27 ENCOUNTER — Encounter (HOSPITAL_COMMUNITY): Payer: Self-pay | Admitting: Anesthesiology

## 2016-07-27 ENCOUNTER — Inpatient Hospital Stay (HOSPITAL_COMMUNITY): Payer: BLUE CROSS/BLUE SHIELD | Admitting: Anesthesiology

## 2016-07-27 ENCOUNTER — Encounter (HOSPITAL_COMMUNITY)
Admission: EM | Disposition: A | Discharge status: Home Health | Payer: Self-pay | Source: Home / Self Care | Attending: Orthopedic Surgery

## 2016-07-27 HISTORY — PX: ORIF TIBIA PLATEAU: SHX2132

## 2016-07-27 LAB — COMPREHENSIVE METABOLIC PANEL
ALT: 32 U/L (ref 14–54)
ANION GAP: 8 (ref 5–15)
AST: 38 U/L (ref 15–41)
Albumin: 3 g/dL — ABNORMAL LOW (ref 3.5–5.0)
Alkaline Phosphatase: 86 U/L (ref 38–126)
BILIRUBIN TOTAL: 0.7 mg/dL (ref 0.3–1.2)
BUN: 6 mg/dL (ref 6–20)
CO2: 26 mmol/L (ref 22–32)
Calcium: 9.6 mg/dL (ref 8.9–10.3)
Chloride: 105 mmol/L (ref 101–111)
Creatinine, Ser: 0.62 mg/dL (ref 0.44–1.00)
GFR calc non Af Amer: >60 mL/min (ref >60)
Glucose, Bld: 96 mg/dL (ref 65–99)
POTASSIUM: 3.9 mmol/L (ref 3.5–5.1)
Sodium: 139 mmol/L (ref 135–145)
TOTAL PROTEIN: 6 g/dL — AB (ref 6.5–8.1)

## 2016-07-27 LAB — PREALBUMIN: PREALBUMIN: 14.8 mg/dL — AB (ref 18–38)

## 2016-07-27 LAB — CBC
HCT: 35.3 % — ABNORMAL LOW (ref 36.0–46.0)
Hemoglobin: 11.7 g/dL — ABNORMAL LOW (ref 12.0–15.0)
MCH: 30.2 pg (ref 26.0–34.0)
MCHC: 33.1 g/dL (ref 30.0–36.0)
MCV: 91 fL (ref 78.0–100.0)
Platelets: 213 10*3/uL (ref 150–400)
RBC: 3.88 MIL/uL (ref 3.87–5.11)
RDW: 13.2 % (ref 11.5–15.5)
WBC: 5.9 10*3/uL (ref 4.0–10.5)

## 2016-07-27 LAB — PHOSPHORUS: Phosphorus: 3.4 mg/dL (ref 2.5–4.6)

## 2016-07-27 LAB — TSH: TSH: 3.746 u[IU]/mL (ref 0.350–4.500)

## 2016-07-27 LAB — MAGNESIUM: Magnesium: 1.9 mg/dL (ref 1.7–2.4)

## 2016-07-27 SURGERY — OPEN REDUCTION INTERNAL FIXATION (ORIF) TIBIAL PLATEAU
Anesthesia: General | Site: Leg Lower | Laterality: Left

## 2016-07-27 MED ORDER — FENTANYL CITRATE (PF) 100 MCG/2ML IJ SOLN
INTRAMUSCULAR | Status: AC
  Administered 2016-07-27: 25 ug via INTRAVENOUS
  Filled 2016-07-27: qty 2

## 2016-07-27 MED ORDER — ONDANSETRON HCL 4 MG PO TABS: 4.0000 mg | ORAL_TABLET | Freq: Four times a day (QID) | ORAL | Status: DC | PRN

## 2016-07-27 MED ORDER — METHOCARBAMOL 500 MG PO TABS
ORAL_TABLET | ORAL | Status: AC
  Administered 2016-07-27: 500 mg via ORAL
  Filled 2016-07-27: qty 1

## 2016-07-27 MED ORDER — PROPOFOL 10 MG/ML IV BOLUS
INTRAVENOUS | Status: DC | PRN
  Administered 2016-07-27: 120 mg via INTRAVENOUS

## 2016-07-27 MED ORDER — ONDANSETRON HCL 4 MG/2ML IJ SOLN
INTRAMUSCULAR | Status: DC | PRN
  Administered 2016-07-27: 4 mg via INTRAVENOUS

## 2016-07-27 MED ORDER — PHENYLEPHRINE HCL 10 MG/ML IJ SOLN
INTRAMUSCULAR | Status: DC | PRN
  Administered 2016-07-27: 80 ug via INTRAVENOUS
  Administered 2016-07-27: 40 ug via INTRAVENOUS
  Administered 2016-07-27 (×2): 80 ug via INTRAVENOUS
  Administered 2016-07-27: 120 ug via INTRAVENOUS

## 2016-07-27 MED ORDER — METOCLOPRAMIDE HCL 5 MG/ML IJ SOLN: 5.0000 mg | Freq: Three times a day (TID) | INTRAMUSCULAR | Status: DC | PRN

## 2016-07-27 MED ORDER — PROPOFOL 10 MG/ML IV BOLUS
INTRAVENOUS | Status: AC
  Filled 2016-07-27: qty 40

## 2016-07-27 MED ORDER — PHENYLEPHRINE 40 MCG/ML (10ML) SYRINGE FOR IV PUSH (FOR BLOOD PRESSURE SUPPORT)
PREFILLED_SYRINGE | INTRAVENOUS | Status: AC
  Filled 2016-07-27: qty 10

## 2016-07-27 MED ORDER — FENTANYL CITRATE (PF) 100 MCG/2ML IJ SOLN
INTRAMUSCULAR | Status: DC | PRN
  Administered 2016-07-27 (×3): 50 ug via INTRAVENOUS

## 2016-07-27 MED ORDER — ENOXAPARIN SODIUM 40 MG/0.4ML ~~LOC~~ SOLN
40.0000 mg | SUBCUTANEOUS | Status: DC
  Administered 2016-07-28 – 2016-07-29 (×2): 40 mg via SUBCUTANEOUS
  Filled 2016-07-27 (×2): qty 0.4

## 2016-07-27 MED ORDER — SUGAMMADEX SODIUM 200 MG/2ML IV SOLN
INTRAVENOUS | Status: AC
  Filled 2016-07-27: qty 2

## 2016-07-27 MED ORDER — SUGAMMADEX SODIUM 200 MG/2ML IV SOLN
INTRAVENOUS | Status: DC | PRN
  Administered 2016-07-27: 200 mg via INTRAVENOUS

## 2016-07-27 MED ORDER — CEFAZOLIN IN D5W 1 GM/50ML IV SOLN
1.0000 g | Freq: Four times a day (QID) | INTRAVENOUS | Status: AC
  Administered 2016-07-27 – 2016-07-28 (×3): 1 g via INTRAVENOUS
  Filled 2016-07-27 (×3): qty 50

## 2016-07-27 MED ORDER — EPHEDRINE 5 MG/ML INJ
INTRAVENOUS | Status: AC
  Filled 2016-07-27: qty 10

## 2016-07-27 MED ORDER — ROCURONIUM BROMIDE 100 MG/10ML IV SOLN
INTRAVENOUS | Status: DC | PRN
  Administered 2016-07-27 (×2): 20 mg via INTRAVENOUS
  Administered 2016-07-27: 50 mg via INTRAVENOUS

## 2016-07-27 MED ORDER — ONDANSETRON HCL 4 MG/2ML IJ SOLN: 4.0000 mg | Freq: Once | INTRAMUSCULAR | Status: DC | PRN

## 2016-07-27 MED ORDER — METOCLOPRAMIDE HCL 5 MG PO TABS: 5.0000 mg | ORAL_TABLET | Freq: Three times a day (TID) | ORAL | Status: DC | PRN

## 2016-07-27 MED ORDER — OXYCODONE HCL 5 MG PO TABS: 5.0000 mg | ORAL_TABLET | Freq: Once | ORAL | Status: DC | PRN

## 2016-07-27 MED ORDER — DOCUSATE SODIUM 100 MG PO CAPS
100.0000 mg | ORAL_CAPSULE | Freq: Two times a day (BID) | ORAL | Status: DC
  Administered 2016-07-27 – 2016-07-29 (×4): 100 mg via ORAL
  Filled 2016-07-27 (×4): qty 1

## 2016-07-27 MED ORDER — LACTATED RINGERS IV SOLN
INTRAVENOUS | Status: DC | PRN
  Administered 2016-07-27 (×2): via INTRAVENOUS

## 2016-07-27 MED ORDER — EPHEDRINE SULFATE 50 MG/ML IJ SOLN
INTRAMUSCULAR | Status: DC | PRN
  Administered 2016-07-27 (×5): 5 mg via INTRAVENOUS
  Administered 2016-07-27 (×2): 10 mg via INTRAVENOUS
  Administered 2016-07-27: 5 mg via INTRAVENOUS

## 2016-07-27 MED ORDER — MIDAZOLAM HCL 2 MG/2ML IJ SOLN
INTRAMUSCULAR | Status: AC
  Filled 2016-07-27: qty 2

## 2016-07-27 MED ORDER — HYDROCODONE-ACETAMINOPHEN 5-325 MG PO TABS
ORAL_TABLET | ORAL | Status: AC
  Administered 2016-07-27: 2 via ORAL
  Filled 2016-07-27: qty 2

## 2016-07-27 MED ORDER — FENTANYL CITRATE (PF) 250 MCG/5ML IJ SOLN
INTRAMUSCULAR | Status: AC
  Filled 2016-07-27: qty 5

## 2016-07-27 MED ORDER — POTASSIUM CHLORIDE IN NACL 20-0.9 MEQ/L-% IV SOLN
INTRAVENOUS | Status: DC
  Administered 2016-07-27 – 2016-07-28 (×2): via INTRAVENOUS
  Filled 2016-07-27 (×2): qty 1000

## 2016-07-27 MED ORDER — ROCURONIUM BROMIDE 50 MG/5ML IV SOSY
PREFILLED_SYRINGE | INTRAVENOUS | Status: AC
  Filled 2016-07-27: qty 10

## 2016-07-27 MED ORDER — 0.9 % SODIUM CHLORIDE (POUR BTL) OPTIME
TOPICAL | Status: DC | PRN
  Administered 2016-07-27: 1000 mL

## 2016-07-27 MED ORDER — OXYCODONE HCL 5 MG/5ML PO SOLN: 5.0000 mg | Freq: Once | ORAL | Status: DC | PRN

## 2016-07-27 MED ORDER — LIDOCAINE HCL (CARDIAC) 20 MG/ML IV SOLN
INTRAVENOUS | Status: DC | PRN
  Administered 2016-07-27: 60 mg via INTRAVENOUS

## 2016-07-27 MED ORDER — ONDANSETRON HCL 4 MG/2ML IJ SOLN
INTRAMUSCULAR | Status: AC
  Filled 2016-07-27: qty 2

## 2016-07-27 MED ORDER — MIDAZOLAM HCL 5 MG/5ML IJ SOLN
INTRAMUSCULAR | Status: DC | PRN
Start: 2016-07-27 — End: 2016-07-27
  Administered 2016-07-27 (×2): 1 mg via INTRAVENOUS

## 2016-07-27 MED ORDER — POLYETHYLENE GLYCOL 3350 17 G PO PACK
17.0000 g | PACK | Freq: Every day | ORAL | Status: DC
  Administered 2016-07-28 – 2016-07-29 (×2): 17 g via ORAL
  Filled 2016-07-27 (×2): qty 1

## 2016-07-27 MED ORDER — ACETAMINOPHEN 325 MG PO TABS: 650.0000 mg | ORAL_TABLET | Freq: Four times a day (QID) | ORAL | Status: DC | PRN

## 2016-07-27 MED ORDER — ONDANSETRON HCL 4 MG/2ML IJ SOLN: 4.0000 mg | Freq: Four times a day (QID) | INTRAMUSCULAR | Status: DC | PRN

## 2016-07-27 MED ORDER — FENTANYL CITRATE (PF) 100 MCG/2ML IJ SOLN
25.0000 ug | INTRAMUSCULAR | Status: DC | PRN
  Administered 2016-07-27 (×4): 25 ug via INTRAVENOUS

## 2016-07-27 MED ORDER — SUGAMMADEX SODIUM 200 MG/2ML IV SOLN: INTRAVENOUS | Status: DC | PRN

## 2016-07-27 MED ORDER — HYDROMORPHONE HCL 1 MG/ML IJ SOLN
0.5000 mg | INTRAMUSCULAR | Status: DC | PRN
Start: 2016-07-27 — End: 2016-07-29
  Filled 2016-07-27 (×2): qty 1

## 2016-07-27 MED ORDER — LIDOCAINE 2% (20 MG/ML) 5 ML SYRINGE
INTRAMUSCULAR | Status: AC
  Filled 2016-07-27: qty 5

## 2016-07-27 MED ORDER — ACETAMINOPHEN 650 MG RE SUPP: 650.0000 mg | Freq: Four times a day (QID) | RECTAL | Status: DC | PRN

## 2016-07-27 SURGICAL SUPPLY — 88 items
BANDAGE ACE 4X5 VEL STRL LF (GAUZE/BANDAGES/DRESSINGS) ×3 IMPLANT
BANDAGE ACE 6X5 VEL STRL LF (GAUZE/BANDAGES/DRESSINGS) ×3 IMPLANT
BANDAGE ESMARK 6X9 LF (GAUZE/BANDAGES/DRESSINGS) ×1 IMPLANT
BIT DRILL 100X2.5XANTM LCK (BIT) IMPLANT
BIT DRILL CAL (BIT) IMPLANT
BIT DRL 100X2.5XANTM LCK (BIT) ×1
BLADE CLIPPER SURG (BLADE) IMPLANT
BLADE SURG 10 STRL SS (BLADE) ×3 IMPLANT
BLADE SURG 15 STRL LF DISP TIS (BLADE) ×1 IMPLANT
BLADE SURG 15 STRL SS (BLADE) ×3
BNDG CMPR 9X6 STRL LF SNTH (GAUZE/BANDAGES/DRESSINGS) ×1
BNDG COHESIVE 4X5 TAN STRL (GAUZE/BANDAGES/DRESSINGS) ×3 IMPLANT
BNDG ESMARK 6X9 LF (GAUZE/BANDAGES/DRESSINGS) ×3
BNDG GAUZE ELAST 4 BULKY (GAUZE/BANDAGES/DRESSINGS) ×3 IMPLANT
BONE CANC CHIPS 40CC CAN1/2 (Bone Implant) ×3 IMPLANT
BRUSH SCRUB DISP (MISCELLANEOUS) ×6 IMPLANT
CANISTER SUCT 3000ML PPV (MISCELLANEOUS) ×3 IMPLANT
CHIPS CANC BONE 40CC CAN1/2 (Bone Implant) ×1 IMPLANT
COVER SURGICAL LIGHT HANDLE (MISCELLANEOUS) ×3 IMPLANT
CUFF TOURNIQUET SINGLE 34IN LL (TOURNIQUET CUFF) ×3 IMPLANT
DRAPE C-ARM 42X72 X-RAY (DRAPES) ×3 IMPLANT
DRAPE C-ARMOR (DRAPES) ×3 IMPLANT
DRAPE HALF SHEET 40X57 (DRAPES) IMPLANT
DRAPE INCISE IOBAN 66X45 STRL (DRAPES) ×3 IMPLANT
DRAPE ORTHO SPLIT 77X108 STRL (DRAPES)
DRAPE SURG ORHT 6 SPLT 77X108 (DRAPES) IMPLANT
DRAPE U-SHAPE 47X51 STRL (DRAPES) ×3 IMPLANT
DRILL BIT 2.5MM (BIT) ×3
DRILL BIT CAL (BIT) ×3
DRSG ADAPTIC 3X8 NADH LF (GAUZE/BANDAGES/DRESSINGS) ×3 IMPLANT
DRSG PAD ABDOMINAL 8X10 ST (GAUZE/BANDAGES/DRESSINGS) ×6 IMPLANT
ELECT REM PT RETURN 9FT ADLT (ELECTROSURGICAL) ×3
ELECTRODE REM PT RTRN 9FT ADLT (ELECTROSURGICAL) ×1 IMPLANT
GAUZE SPONGE 4X4 12PLY STRL (GAUZE/BANDAGES/DRESSINGS) ×3 IMPLANT
GLOVE BIO SURGEON STRL SZ7.5 (GLOVE) ×3 IMPLANT
GLOVE BIO SURGEON STRL SZ8 (GLOVE) ×3 IMPLANT
GLOVE BIOGEL PI IND STRL 7.5 (GLOVE) ×1 IMPLANT
GLOVE BIOGEL PI IND STRL 8 (GLOVE) ×1 IMPLANT
GLOVE BIOGEL PI INDICATOR 7.5 (GLOVE) ×2
GLOVE BIOGEL PI INDICATOR 8 (GLOVE) ×2
GOWN STRL REUS W/ TWL LRG LVL3 (GOWN DISPOSABLE) ×2 IMPLANT
GOWN STRL REUS W/ TWL XL LVL3 (GOWN DISPOSABLE) ×1 IMPLANT
GOWN STRL REUS W/TWL LRG LVL3 (GOWN DISPOSABLE) ×6
GOWN STRL REUS W/TWL XL LVL3 (GOWN DISPOSABLE) ×3
GRAFT BNE CHIP CANC 1-8 40 (Bone Implant) IMPLANT
IMMOBILIZER KNEE 22 UNIV (SOFTGOODS) ×3 IMPLANT
K-WIRE ACE 1.6X6 (WIRE) ×9
KIT BASIN OR (CUSTOM PROCEDURE TRAY) ×3 IMPLANT
KIT ROOM TURNOVER OR (KITS) ×3 IMPLANT
KWIRE ACE 1.6X6 (WIRE) IMPLANT
NDL SUT 6 .5 CRC .975X.05 MAYO (NEEDLE) IMPLANT
NEEDLE MAYO TAPER (NEEDLE) ×3
NS IRRIG 1000ML POUR BTL (IV SOLUTION) ×3 IMPLANT
PACK ORTHO EXTREMITY (CUSTOM PROCEDURE TRAY) ×3 IMPLANT
PAD ARMBOARD 7.5X6 YLW CONV (MISCELLANEOUS) ×6 IMPLANT
PAD CAST 4YDX4 CTTN HI CHSV (CAST SUPPLIES) ×1 IMPLANT
PADDING CAST COTTON 4X4 STRL (CAST SUPPLIES) ×3
PADDING CAST COTTON 6X4 STRL (CAST SUPPLIES) ×3 IMPLANT
PLATE LOCK 5H STD LT PROX TIB (Plate) ×2 IMPLANT
SCREW CORT FT 32X3.5XNONLOCK (Screw) IMPLANT
SCREW CORTICAL 3.5MM  32MM (Screw) ×4 IMPLANT
SCREW CORTICAL 3.5MM 32MM (Screw) ×2 IMPLANT
SCREW CORTICAL 3.5MM 38MM (Screw) ×2 IMPLANT
SCREW LOCK CORT STAR 3.5X65 (Screw) ×2 IMPLANT
SCREW LOCK CORT STAR 3.5X70 (Screw) ×2 IMPLANT
SCREW LOCK CORT STAR 3.5X75 (Screw) ×4 IMPLANT
SCREW LP 3.5X70MM (Screw) ×2 IMPLANT
SCREW LP 3.5X75MM (Screw) ×2 IMPLANT
SPONGE LAP 18X18 X RAY DECT (DISPOSABLE) ×3 IMPLANT
STAPLER VISISTAT 35W (STAPLE) ×3 IMPLANT
STOCKINETTE IMPERVIOUS LG (DRAPES) ×3 IMPLANT
SUCTION FRAZIER HANDLE 10FR (MISCELLANEOUS) ×2
SUCTION TUBE FRAZIER 10FR DISP (MISCELLANEOUS) ×1 IMPLANT
SUT ETHILON 3 0 PS 1 (SUTURE) IMPLANT
SUT PROLENE 0 CT 2 (SUTURE) ×6 IMPLANT
SUT VIC AB 0 CT1 27 (SUTURE) ×3
SUT VIC AB 0 CT1 27XBRD ANBCTR (SUTURE) ×1 IMPLANT
SUT VIC AB 1 CT1 27 (SUTURE) ×3
SUT VIC AB 1 CT1 27XBRD ANBCTR (SUTURE) ×1 IMPLANT
SUT VIC AB 2-0 CT1 27 (SUTURE) ×6
SUT VIC AB 2-0 CT1 TAPERPNT 27 (SUTURE) ×2 IMPLANT
TOWEL OR 17X24 6PK STRL BLUE (TOWEL DISPOSABLE) ×3 IMPLANT
TOWEL OR 17X26 10 PK STRL BLUE (TOWEL DISPOSABLE) ×6 IMPLANT
TRAY FOLEY W/METER SILVER 16FR (SET/KITS/TRAYS/PACK) IMPLANT
TUBE CONNECTING 12'X1/4 (SUCTIONS) ×1
TUBE CONNECTING 12X1/4 (SUCTIONS) ×2 IMPLANT
WATER STERILE IRR 1000ML POUR (IV SOLUTION) ×6 IMPLANT
YANKAUER SUCT BULB TIP NO VENT (SUCTIONS) ×3 IMPLANT

## 2016-07-27 NOTE — Progress Notes (Signed)
  Physical Therapy Cancellation Note   07/27/16 0816  PT Visit Information  Last PT Received On 07/27/16  Reason Eval/Treat Not Completed Patient at procedure or test/unavailable; Pt in OR for ORIF; PT will continue to follow acutely.   History of Present Illness Pti s a 66 yo female admitted through ED following a fall on 07/23/16 when she was tangled up on a dogs leash. Pt sustained a L tibial plateau fx and underwent an external fixation on 07/24/16. PMH significant for asthma, GERD.   Earney Navy, PTA Pager: [phone]

## 2016-07-27 NOTE — Brief Op Note (Signed)
07/23/2016 - 07/27/2016  10:38 AM  PATIENT:  [name]  66 y.o. female  PRE-OPERATIVE DIAGNOSIS:   1. LEFT TIBIAL PLATEAU FRACTURE 2. RETAINED EXTERNAL FIXATOR  POST-OPERATIVE DIAGNOSIS:   1. LEFT LATERAL TIBIAL PLATEAU FRACTURE 2. LATERAL MENISCUS TEAR INNER THIRD 3. RETAINED EXTERNAL FIXATOR  PROCEDURE:  Procedure(s): 1. OPEN REDUCTION INTERNAL FIXATION (ORIF) TIBIAL PLATEAU (Left) 2. PARTIAL LATERAL MENISCECTOMY 3. ANTERIOR COMPARTMENT FASCIOTOMY 4. REMOVAL OF EXTERNAL FIXATOR  SURGEON:  Surgeon(s) and Role:    * Altamese Bendon, MD - Primary  PHYSICIAN ASSISTANT: Ainsley Spinner, PAC  ANESTHESIA:   general  EBL:  Total I/O In: 1200 [I.V.:1200] Out: 100 [Blood:100]  BLOOD ADMINISTERED:none  DRAINS: none   LOCAL MEDICATIONS USED:  NONE  SPECIMEN:  No Specimen  DISPOSITION OF SPECIMEN:  N/A  COUNTS:  YES  TOURNIQUET:  * No tourniquets in log *  DICTATION: .Other Dictation: Dictation Number [id number]  PLAN OF CARE: Admit to inpatient   PATIENT DISPOSITION:  PACU - hemodynamically stable.   Delay start of Pharmacological VTE agent (>24hrs) due to surgical blood loss or risk of bleeding: NO

## 2016-07-27 NOTE — Evaluation (Addendum)
Occupational Therapy Treatment Patient Details Name: Angel Irwin MRN: [id number] DOB: 10/14/1950 Today's Date: 07/27/2016    History of Present Illness Pti s a 65 yo female admitted through ED following a fall on 07/23/16 when she was tangled up on a dogs leash. Pt sustained a L tibial plateau fx and underwent an external fixation on 07/24/16. Pt underwent Open reduction internal fixation of left lateral tibial plateau on 07/27/16. PMH significant for asthma, GERD.    Clinical Impression   Pt currently performing UB ADLs, grooming, toileting, and functional mobility with RW and supervision-Min guard A for safety. Pt demonstrated good functional performance and feel she will progress well with time. Pt would benefit from continued skilled OT to facilitate safe dc and progress towards goals. Recommend pt dc home with HHOT to increase her safety and independence with ADLs and functional mobility.     Follow Up Recommendations  Home health OT;Supervision - Intermittent (children are unable to provide 24 hour support)    Equipment Recommendations  None recommended by OT (Pt's 3n1 and RW have been delivered to her room)    Recommendations for Other Services       Precautions / Restrictions Precautions Precautions: Fall Restrictions Weight Bearing Restrictions: Yes LLE Weight Bearing: Weight bearing as tolerated      Mobility Bed Mobility Overal bed mobility: Needs Assistance Bed Mobility: Supine to Sit     Supine to sit: Min assist     General bed mobility comments: Pt benefited from Min A to move LLE due to recent surgery  Transfers Overall transfer level: Needs assistance Equipment used: Rolling walker (2 wheeled) Transfers: Sit to/from [facility] Sit to Stand: Min guard         General transfer comment: min guard for safety; demo of safe hand placement and able to maintian NWB L LE    Balance Overall balance assessment: Needs  assistance Sitting-balance support: No upper extremity supported;Feet supported Sitting balance-Leahy Scale: Good     Standing balance support: Single extremity supported;During functional activity Standing balance-Leahy Scale: Fair Standing balance comment: Pt performed grooming at sink with no signs of unsteadiness                           ADL either performed or assessed with clinical judgement   ADL Overall ADL's : Needs assistance/impaired Eating/Feeding: Modified independent;Sitting   Grooming: Wash/dry hands;Oral care;Set up;Standing;Supervision/safety Grooming Details (indicate cue type and reason): Pt perform grooming at sink with supervision demosntrating good balance to maintain NWB status Upper Body Bathing: Set up;Sitting   Lower Body Bathing: Moderate assistance;Sitting/lateral leans   Upper Body Dressing : Set up;Sitting   Lower Body Dressing: Sit to/from stand;Moderate assistance Lower Body Dressing Details (indicate cue type and reason): Pt demonstrated good ROM to reach R sock. Would benefit from AE training for LLE (specifically reacher) Toilet Transfer: BSC;Min guard;Ambulation;RW Toilet Transfer Details (indicate cue type and reason): able to maintain NWB without problem Toileting- Clothing Manipulation and Hygiene: Set up;Sit to/from stand Toileting - Clothing Manipulation Details (indicate cue type and reason): managing hospital gown and peri care   Tub/Shower Transfer Details (indicate cue type and reason): benefit from tub transfer training Functional mobility during ADLs: Min guard;Rolling walker General ADL Comments: Pt demonstrates good fucntional performance and would benefit from further OT to address LB ADLs and tub transfer before dc to daughter's     Vision Baseline Vision/History: Wears glasses Wears Glasses: At all times  Patient Visual Report: No change from baseline Vision Assessment?: No apparent visual deficits     Perception      Praxis      Pertinent Vitals/Pain Pain Assessment: Faces Faces Pain Scale: Hurts a little bit Pain Location: LLE with mobility Pain Descriptors / Indicators: Aching Pain Intervention(s): Monitored during session     Hand Dominance Right   Extremity/Trunk Assessment Upper Extremity Assessment Upper Extremity Assessment: Overall WFL for tasks assessed   Lower Extremity Assessment Lower Extremity Assessment: Defer to PT evaluation;LLE deficits/detail LLE Deficits / Details: pt with limitations related to internal fixation on 07/27/16. A LLE: Unable to fully assess due to immobilization;Unable to fully assess due to pain   Cervical / Trunk Assessment Cervical / Trunk Assessment: Normal   Communication Communication Communication: No difficulties   Cognition Arousal/Alertness: Awake/alert Behavior During Therapy: WFL for tasks assessed/performed Overall Cognitive Status: Within Functional Limits for tasks assessed                                     General Comments  Daughter present for session. VSS throughout session on room air    Exercises     Shoulder Instructions      Home Living Family/patient expects to be discharged to:: Private residence Living Arrangements: Alone Available Help at Discharge: Family;Available PRN/intermittently (local daughter and son in [place]) Type of Home: Apartment Home Access: Level entry;Other (comment) (curbs in and out of parking lot)     Home Layout: One level     Bathroom Shower/Tub: Corporate investment banker: Standard Bathroom Accessibility: Yes How Accessible: Accessible via walker Home Equipment: None          Prior Functioning/Environment Level of Independence: Independent        Comments: works as Regulatory affairs officer, and in Financial [employer]        OT Problem List: Decreased activity tolerance;Decreased range of motion;Impaired balance (sitting and/or standing);Decreased  knowledge of use of DME or AE;Pain      OT Treatment/Interventions: Self-care/ADL training;Therapeutic exercise;DME and/or AE instruction;Therapeutic activities;Patient/family education;Balance training    OT Goals(Current goals can be found in the care plan section) Acute Rehab OT Goals Patient Stated Goal: to get home OT Goal Formulation: With patient Time For Goal Achievement: 08/10/16 Potential to Achieve Goals: Good  OT Frequency: Min 2X/week (potentially increase after surgery)   Barriers to D/C: Decreased caregiver support          Co-evaluation              End of Session Equipment Utilized During Treatment: Rolling walker Nurse Communication: Mobility status;Weight bearing status  Activity Tolerance: Patient tolerated treatment well Patient left: in chair;with call bell/phone within reach  OT Visit Diagnosis: Other abnormalities of gait and mobility (R26.89);Pain Pain - Right/Left: Left Pain - part of body: Leg                Time: 1550-1617 OT Time Calculation (min): 27 min Charges:  OT General Charges $OT Visit: 1 Procedure OT Treatments $Self Care/Home Management : 23-37 mins G-Codes:     [employer], OTR/L [place] 07/27/2016, 5:12 PM

## 2016-07-27 NOTE — Op Note (Signed)
[name], [name]            ACCOUNT NO.:  [id number]  MEDICAL RECORD NO.:  [id number]  LOCATION:  [facility]                         FACILITY:  [facility]  PHYSICIAN:  Angel Irwin, M.D. DATE OF BIRTH:  1951-01-26  DATE OF PROCEDURE:  07/27/2016 DATE OF DISCHARGE:                              OPERATIVE REPORT   PREOPERATIVE DIAGNOSES: 1. Left tibial lateral plateau fracture. 2. Retained external fixator.  POSTOPERATIVE DIAGNOSES: 1. Left lateral tibial plateau fracture. 2. Lateral meniscus tear involving the inner third. 3. Retained external fixator.  PROCEDURES: 1. Open reduction internal fixation of left lateral tibial plateau. 2. Partial lateral meniscectomy. 3. Anterior compartment fasciotomy. 4. Removal of external fixator under anesthesia.  SURGEON:  Angel Irwin, M.D.  ASSISTANT:  Ainsley Spinner, PA-C.  ANESTHESIA:  General.  COMPLICATIONS:  None.  TOURNIQUET:  None.  I/O:  1200 mL crystalloid, EBL 100 mL.  DISPOSITION:  To PACU.  CONDITION:  Stable.  BRIEF SUMMARY AND INDICATIONS FOR PROCEDURE:  Angel Irwin is a very pleasant 66 year old female, who sustained a severely depressed lateral tibial plateau fracture with severe angulation and telescoping of the lateral femoral condyle within the proximal tibia bone.  Consequently, the surgeon on-call, Dr. Rod Irwin, recognized the extent and severity of the injury and deemed that these were outside his scope of practice for definitive treatment and that these should be best managed by fellowship trained orthopedic traumatologist.  Consequently, he applied an external fixator to bring the femur out of the tibia and hold it in an appropriate alignment until definitive fixation could be performed.  We were consulted to see the patient and did discuss with her the risks and benefits of surgical repair including potential for arthritis, loss of motion, malunion, nonunion, symptomatic hardware, heart  attack, stroke, DVT, PE, and many others.  She did wish to proceed.  BRIEF SUMMARY OF PROCEDURE:  The patient was taken to the operating room, where general anesthesia was induced.  The left lower extremity then underwent removal of the external fixator.  Chlorhexidine scrub was used to aggressively clean the pin sites, but no formal curettage was required as it only been on for less than a week.  The skin did wrinkle anticipate area incision.  Following removal, traction was held on the leg while a standard Betadine scrub and paint was performed after the chlorhexidine wash.  Time-out was held.  Then, a curvilinear anterolateral incision made.  Dissection was carried down to the retinaculum, which was incised proximal to the joint.  The muscle insertion was released along the proximal lateral tibial plateau, keeping the periosteum intact over the lateral aspect of the plateau itself.  The coronary ligament was incised along its insertion on the proximal tibia and reflected.  This revealed a tear of the inner third of the lateral meniscus, most significant at the mid body area, but also with some posterior and anterior extension.  The exit of the fracture site anteriorly was identified and developed with a 10 blade booking open the outer segment and retaining its blood supply.  This enabled [place] to then perform a partial lateral meniscectomy of the torn area as it was in the inner third and not capable  of repair.  This was performed with a 15 blade sharply as well as the arthroscopic biter to taper the edge.  The articular surface was impacted nearly 2 cm using a Cobb and carefully going underneath the subchondral bone.  I was able to mobilize this entire segment and mass and elevated up into a reduced position while my assistant, Ainsley Spinner, held the knee in 90 degrees of flexion with varus force.  Once this was elevated, it was pinned with K-wires and the large metaphyseal defect created  by this elevation was grafted with about 25 mL of cancellous chips.  Once these were secured into position, the plate was apposed to the lateral plateau and fixed provisionally with K-wires.  The plate position was checked on several views and then 2 standard screws were placed, 1 in the most anterior proximal hole and 1 in the most posterior proximal holes at the plateau level.  Screws were checked for trajectory on AP fluoro shots.  The plateau was then further adjusted and elevated by using a clamp at the diaphysis and applying proximal force to the plate while my assistant again applied varus force to the knee.  This was secured with several screws into the shaft and then the additional screws were placed in the subchondral bone proximally using the 6 remaining subchondral screws all in locked fashion.  Wounds were irrigated thoroughly.  Final images showed appropriate reduction, hardware placement, trajectory and length.  I then turned my attention distally where because of the acute repair, the long Metzenbaum scissors were used to spread both superficial and deep to the anterior compartment fascia with the scissor tips pointed away from the superficial peroneal nerve.  It was then released for over 10 cm to prevent postoperative compartment syndrome.  Once more irrigation performed and then the coronary ligament repaired with vertical mattress 0 Prolene sutures, then #1 Vicryl for the retinaculum proximally, 2-0 Vicryl and 3-0 nylon for the subcu and skin.  Sterile gently compressive dressing was applied.  Ainsley Spinner, PA-C, again assisted me throughout all portions of the procedure.  PROGNOSIS:  Angel Irwin will be on formal DVT prophylaxis with unrestricted range of motion of the knee.  She appeared to have a stable ligamentous examination and consequently, if she is unable to tolerate the hinged knee brace Irwin go without this, though it is an additional precaution against  direct impact on the area that was depressed at the time of her fracture.  She will be nonweightbearing for the next 6 weeks with graduated weightbearing thereafter.  She remains at increased risk for arthritis and loss of motion.    Angel Irwin, M.D.    MHH/MEDQ  D:  07/27/2016  T:  07/27/2016  Job:  [id number]

## 2016-07-27 NOTE — Progress Notes (Signed)
OT Cancellation    07/27/16 0900  OT Visit Information  Last OT Received On 07/27/16  Reason Eval/Treat Not Completed Patient at procedure or test/ unavailable (In the OR. Will see as schedule allows.)   [facility], OTR/L [phone]

## 2016-07-27 NOTE — Anesthesia Preprocedure Evaluation (Signed)
Anesthesia Evaluation  Patient identified by MRN, date of birth, ID band Patient awake    Reviewed: Allergy & Precautions, NPO status , Patient's Chart, lab work & pertinent test results  Airway Mallampati: II  TM Distance: >3 FB Neck ROM: Full    Dental  (+) Teeth Intact, Dental Advisory Given   Pulmonary    breath sounds clear to auscultation       Cardiovascular  Rhythm:Regular Rate:Normal     Neuro/Psych    GI/Hepatic   Endo/Other    Renal/GU      Musculoskeletal   Abdominal   Peds  Hematology   Anesthesia Other Findings   Reproductive/Obstetrics                             Anesthesia Physical Anesthesia Plan  ASA: III  Anesthesia Plan: General   Post-op Pain Management:    Induction: Intravenous  Airway Management Planned: Oral ETT  Additional Equipment:   Intra-op Plan:   Post-operative Plan: Extubation in OR  Informed Consent: I have reviewed the patients History and Physical, chart, labs and discussed the procedure including the risks, benefits and alternatives for the proposed anesthesia with the patient or authorized representative who has indicated his/her understanding and acceptance.   Dental advisory given  Plan Discussed with: CRNA and Anesthesiologist  Anesthesia Plan Comments:        Anesthesia Quick Evaluation  

## 2016-07-27 NOTE — Anesthesia Postprocedure Evaluation (Addendum)
Anesthesia Post Note  Patient: Angel Irwin  Procedure(s) Performed: Procedure(s) (LRB): OPEN REDUCTION INTERNAL FIXATION (ORIF) TIBIAL PLATEAU (Left)  Patient location during evaluation: PACU Anesthesia Type: General Level of consciousness: awake, awake and alert and oriented Pain management: pain level controlled Vital Signs Assessment: post-procedure vital signs reviewed and stable Respiratory status: spontaneous breathing, nonlabored ventilation and respiratory function stable Cardiovascular status: blood pressure returned to baseline Anesthetic complications: no       Last Vitals:  Vitals:   07/27/16 1133 07/27/16 1246  BP: (!) 138/56 132/62  Pulse: 84 70  Resp: 17 17  Temp:  36.5 C    Last Pain:  Vitals:   07/27/16 1246  TempSrc: Oral  PainSc:                  Ole Lafon COKER

## 2016-07-27 NOTE — Anesthesia Procedure Notes (Signed)
Procedure Name: Intubation Date/Time: 07/27/2016 8:17 AM Performed by: Scheryl Darter Pre-anesthesia Checklist: Patient identified, Emergency Drugs available, Suction available and Patient being monitored Patient Re-evaluated:Patient Re-evaluated prior to inductionOxygen Delivery Method: Circle System Utilized Preoxygenation: Pre-oxygenation with 100% oxygen Intubation Type: IV induction Ventilation: Mask ventilation without difficulty Grade View: Grade I Tube type: Oral Tube size: 7.5 mm Number of attempts: 1 Airway Equipment and Method: Stylet and Oral airway Placement Confirmation: ETT inserted through vocal cords under direct vision,  positive ETCO2 and breath sounds checked- equal and bilateral Secured at: 21 cm Tube secured with: Tape Dental Injury: Teeth and Oropharynx as per pre-operative assessment

## 2016-07-27 NOTE — Transfer of Care (Signed)
Immediate Anesthesia Transfer of Care Note  Patient: Angel Irwin  Procedure(s) Performed: Procedure(s): OPEN REDUCTION INTERNAL FIXATION (ORIF) TIBIAL PLATEAU (Left)  Patient Location: PACU  Anesthesia Type:General  Level of Consciousness: awake, alert , oriented and sedated  Airway & Oxygen Therapy: Patient Spontanous Breathing and Patient connected to nasal cannula oxygen  Post-op Assessment: Report given to RN, Post -op Vital signs reviewed and stable and Patient moving all extremities  Post vital signs: Reviewed and stable  Last Vitals:  Vitals:   07/27/16 0448 07/27/16 1104  BP: (!) 119/47   Pulse: (!) 57   Resp: 16   Temp: 37.1 C 36.4 C    Last Pain:  Vitals:   07/27/16 1104  TempSrc:   PainSc: 0-No pain      Patients Stated Pain Goal: 2 (66/59/93 5701)  Complications: No apparent anesthesia complications

## 2016-07-28 ENCOUNTER — Encounter (HOSPITAL_COMMUNITY): Payer: Self-pay | Admitting: Orthopedic Surgery

## 2016-07-28 LAB — CALCITRIOL (1,25 DI-OH VIT D): Vit D, 1,25-Dihydroxy: 87.6 pg/mL — ABNORMAL HIGH (ref 19.9–79.3)

## 2016-07-28 LAB — BASIC METABOLIC PANEL
Anion gap: 7 (ref 5–15)
BUN: 5 mg/dL — AB (ref 6–20)
CHLORIDE: 106 mmol/L (ref 101–111)
CO2: 26 mmol/L (ref 22–32)
CREATININE: 0.62 mg/dL (ref 0.44–1.00)
Calcium: 9.2 mg/dL (ref 8.9–10.3)
GFR calc non Af Amer: >60 mL/min (ref >60)
Glucose, Bld: 105 mg/dL — ABNORMAL HIGH (ref 65–99)
POTASSIUM: 4.9 mmol/L (ref 3.5–5.1)
Sodium: 139 mmol/L (ref 135–145)

## 2016-07-28 LAB — VITAMIN D 25 HYDROXY (VIT D DEFICIENCY, FRACTURES): Vit D, 25-Hydroxy: 14.2 ng/mL — ABNORMAL LOW (ref 30.0–100.0)

## 2016-07-28 LAB — PTH, INTACT AND CALCIUM
Calcium, Total (PTH): 9.4 mg/dL (ref 8.7–10.3)
PTH: 60 pg/mL (ref 15–65)

## 2016-07-28 LAB — CALCIUM, IONIZED: CALCIUM, IONIZED, SERUM: 5.6 mg/dL (ref 4.5–5.6)

## 2016-07-28 NOTE — Progress Notes (Signed)
07/28/2016 Pt is mobilizing safely enough to d/c home. HEP given and she is still waiting on hinged brace.  Full PT note to follow.  Thanks, Barbarann Ehlers. [place], [place], DPT [phone]

## 2016-07-28 NOTE — Progress Notes (Addendum)
Occupational Therapy Treatment Patient Details Name: Angel Irwin MRN: [id number] DOB: 30-Nov-1950 Today's Date: 07/28/2016    History of present illness Pti s a 66 yo female admitted through ED following a fall on 07/23/16 when she was tangled up on a dogs leash. Pt sustained a L tibial plateau fx and underwent an external fixation on 07/24/16. Pt underwent Open reduction internal fixation of left lateral tibial plateau on 07/27/16. PMH significant for asthma, GERD.    OT comments  Pt demonstrating good progress towards goals. Pt performed ADLs and functional mobility with supervision-min guard A. Pt performed LB dressing with set up and Min guard for standing balance when donning pants. Pt demonstrated good understanding of tub transfer techniques and benefited from [place] guard A for safety. Provided pt with all education and answered questions. Continue to recommend dc home with HHOT to increase pt safety and independence with ADLs and functional mobility.    Follow Up Recommendations  Home health OT;Supervision - Intermittent    Equipment Recommendations  None recommended by OT (Pt's 3n1 and RW have been delivered to her room)    Recommendations for Other Services      Precautions / Restrictions Precautions Precautions: Fall Restrictions Weight Bearing Restrictions: Yes LLE Weight Bearing: Non weight bearing       Mobility Bed Mobility Overal bed mobility: Needs Assistance Bed Mobility: Supine to Sit     Supine to sit: Min assist     General bed mobility comments: benefits from Min A to move LLE to EOB  Transfers Overall transfer level: Needs assistance Equipment used: Rolling walker (2 wheeled) Transfers: Sit to/from [facility] Sit to Stand: Supervision         General transfer comment: Pt demonstrates good technique for sit<>stand    Balance Overall balance assessment: Needs assistance Sitting-balance support: No upper extremity supported;Feet  supported Sitting balance-Leahy Scale: Good     Standing balance support: No upper extremity supported;During functional activity Standing balance-Leahy Scale: Fair Standing balance comment: Benefited from [place] guard to pull up pants in standing                           ADL either performed or assessed with clinical judgement   ADL Overall ADL's : Needs assistance/impaired     Grooming: Wash/dry hands;Oral care;Standing;Min guard               Lower Body Dressing: Min guard;Sit to/from stand Lower Body Dressing Details (indicate cue type and reason): Pt demonstrated good ROM to reach down and don pants. Pt benefited from [place] guard to pull pants up in standing. Demonstrated some unsteadiness while balancing on one leg and pulling up pants but recovered well Toilet Transfer: BSC;RW;Ambulation;Supervision/safety Toilet Transfer Details (indicate cue type and reason): Able to maintained NWB precautions and demonstrated good and safe technique Toileting- Clothing Manipulation and Hygiene: Set up;Sit to/from stand Toileting - Clothing Manipulation Details (indicate cue type and reason): managing hospital gown and peri care Tub/ Shower Transfer: Tub transfer;3 in 1;Rolling walker;Ambulation;Cueing for sequencing;Min guard Tub/Shower Transfer Details (indicate cue type and reason): Pt demonstrated good understanding of tub t/f. Educated pt on havign her daughter to supervise for bathing Functional mobility during ADLs: Min guard;Rolling walker General ADL Comments: Pt demonstrated good progress towards goals. Feel pt will progress well with time     Vision       Perception     Praxis      Cognition  Arousal/Alertness: Awake/alert Behavior During Therapy: WFL for tasks assessed/performed Overall Cognitive Status: Within Functional Limits for tasks assessed                                          Exercises     Shoulder Instructions        General Comments Provided pt with all education and answered questions    Pertinent Vitals/ Pain       Pain Assessment: Faces Faces Pain Scale: Hurts a little bit Pain Location: LLE with mobility Pain Descriptors / Indicators: Aching Pain Intervention(s): Monitored during session  Home Living                                          Prior Functioning/Environment              Frequency  Min 2X/week        Progress Toward Goals  OT Goals(current goals can now be found in the care plan section)  Progress towards OT goals: Progressing toward goals  Acute Rehab OT Goals Patient Stated Goal: to get home OT Goal Formulation: With patient Time For Goal Achievement: 08/10/16 Potential to Achieve Goals: Good ADL Goals Pt Will Perform Upper Body Bathing: with modified independence;sitting;with adaptive equipment Pt Will Perform Lower Body Bathing: with modified independence;sitting/lateral leans Pt Will Transfer to Toilet: with modified independence;ambulating Pt Will Perform Toileting - Clothing Manipulation and hygiene: with modified independence;sit to/from stand Pt Will Perform Tub/Shower Transfer: Tub transfer;3 in 1;rolling walker;anterior/posterior transfer  Plan Discharge plan remains appropriate    Co-evaluation                 End of Session Equipment Utilized During Treatment: Rolling walker  OT Visit Diagnosis: Other abnormalities of gait and mobility (R26.89);Pain Pain - Right/Left: Left Pain - part of body: Leg   Activity Tolerance Patient tolerated treatment well   Patient Left in chair;with call bell/phone within reach   Nurse Communication Mobility status;Weight bearing status        Time: 3888-2800 OT Time Calculation (min): 22 min  Charges: OT General Charges $OT Visit: 1 Procedure OT Treatments $Self Care/Home Management : 8-22 mins  [place], OTR/L [place] 07/28/2016, 3:06  PM

## 2016-07-28 NOTE — Progress Notes (Signed)
Orthopedic Tech Progress Note Patient Details:  Angel Irwin [birth year] [id number]  Patient ID: [name], female   DOB: 17-Oct-1950, 67 y.o.   MRN: [id number]   Hildred Priest 07/28/2016, 2:11 PM Called in advanced brace order; spoke with Carolyne Fiscal

## 2016-07-28 NOTE — Progress Notes (Signed)
Orthopedic Tech Progress Note Patient Details:  Angel Irwin 05-08-1950 [id number]  Ortho Devices Type of Ortho Device:  (footsie roll) Ortho Device/Splint Location: lle Ortho Device/Splint Interventions: Application   Chrishauna Mee 07/28/2016, 2:31 PM

## 2016-07-28 NOTE — Progress Notes (Signed)
Stopped by to visit w/ pt in the rm. But her daughter and young grandson were there, and, after a brief conversation, she said she'd like to visit more w/ them then. Pt is Jehovah's Witness. Chaplain available for f/u.   07/28/16 1600  Clinical Encounter Type  Visited With Patient;Patient and family together  Visit Type Initial  Referral From Chaplain  Spiritual Encounters  Spiritual Needs Emotional  Stress Factors  Patient Stress Factors Health changes  Family Stress Factors Family relationships;Health changes   Gerrit Heck, Chaplain

## 2016-07-28 NOTE — Progress Notes (Signed)
07/28/16 1656  PT Re-Eval  Last PT Received On 07/28/16  Assistance Needed +1  History of Present Illness Pti s a 66 yo female admitted through ED following a fall on 07/23/16 when she was tangled up on a dogs leash. Pt sustained a L tibial plateau fx and underwent an external fixation on 07/24/16. Pt underwent Open reduction internal fixation of left lateral tibial plateau on 07/27/16. PMH significant for asthma, GERD.   Precautions  Precautions Fall  Required Braces or Orthoses Other Brace/Splint  Other Brace/Splint hinged brace  Restrictions  LLE Weight Bearing NWB  Other Position/Activity Restrictions no restrictions in L knee flexion.   Home Living  Family/patient expects to be discharged to: Private residence  Living Arrangements Alone  Available Help at Discharge Family;Available PRN/intermittently (local daughter and son in [place])  Type of Home Apartment  Home Access Level entry;Other (comment) (curbs in and out of parking lot)  Home Layout One level  Bathroom Shower/Tub Tub/shower unit;Curtain  [place] None  Prior Function  Level of Independence Independent  Comments works as Regulatory affairs officer, and in Advice worker No difficulties  Pain Assessment  Pain Assessment Faces  Faces Pain Scale 2  Pain Location LLE with mobility  Pain Descriptors / Indicators Grimacing;Guarding  Pain Intervention(s) Limited activity within patient's tolerance;Monitored during session;Repositioned  Cognition  Arousal/Alertness Awake/alert  Behavior During Therapy WFL for tasks assessed/performed  Overall Cognitive Status Within Functional Limits for tasks assessed  Upper Extremity Assessment  Upper Extremity Assessment Defer to OT evaluation  Lower Extremity Assessment  Lower Extremity Assessment LLE deficits/detail  LLE Deficits / Details left leg with normal post op pain and weakness, ankle 3/5,  knee 2/5, hip flexion 2+/5  LLE Sensation (intact to LT)  Cervical / Trunk Assessment  Cervical / Trunk Assessment Normal  Bed Mobility  General bed mobility comments Pt is OOB in recliner chair, just finished with OT  Transfers  Overall transfer level Needs assistance  Equipment used Rolling walker (2 wheeled)  Transfers Sit to/from Stand  Sit to Stand Supervision  General transfer comment supervision for safety  Ambulation/Gait  General stair comments NT as pt was just mobilizing around with OT in therapy gym and feels comfortable with RW use.    Balance  Overall balance assessment Needs assistance  Sitting-balance support Feet supported;No upper extremity supported  Sitting balance-Leahy Scale Good  Standing balance support No upper extremity supported;Bilateral upper extremity supported;Single extremity supported  Standing balance-Leahy Scale Fair  Standing balance comment was able to stand on one leg and pull pants down with supervision.   General Comments  General comments (skin integrity, edema, etc.) Provided pt with HEP hanout of exercises.  Brace vendor has been called, but no brace yet.   Exercises  Exercises Total Joint  Total Joint Exercises  Ankle Circles/Pumps AROM;Both;20 reps;Supine  Quad Sets AROM;Both;10 reps  Towel Squeeze AROM;Both;10 reps  Short Arc [employer];Left;10 reps  Heel Slides AAROM;Left;10 reps  Hip ABduction/ADduction AAROM;Left;10 reps  Straight Leg Raises AAROM;Left;10 reps  PT - End of Session  Activity Tolerance Patient tolerated treatment well  Patient left in chair;with call bell/phone within reach  PT Assessment  PT Recommendation/Assessment Patient needs continued PT services  PT Visit Diagnosis Muscle weakness (generalized) (M62.81);Difficulty in walking, not elsewhere classified (R26.2);Pain  Pain - Right/Left Left  Pain - part of body Leg  PT Problem List Decreased strength;Decreased range of motion;Decreased activity  tolerance;Decreased balance;Decreased mobility;Decreased knowledge of use of DME;Pain  PT Plan  PT Frequency (ACUTE ONLY) Min 5X/week  PT Treatment/Interventions (ACUTE ONLY) DME instruction;Gait training;Functional mobility training;Therapeutic activities;Therapeutic exercise;Balance training  AM-PAC PT "6 Clicks" Daily Activity Outcome Measure  Difficulty turning over in bed (including adjusting bedclothes, sheets and blankets)? 3  Difficulty moving from lying on back to sitting on the side of the bed?  3  Difficulty sitting down on and standing up from a chair with arms (e.g., wheelchair, bedside commode, etc,.)? 3  Help needed moving to and from a bed to chair (including a wheelchair)? 3  Help needed walking in hospital room? 3  Help needed climbing 3-5 steps with a railing?  3  6 Click Score 18  Mobility G Code  CK  PT Recommendation  Follow Up Recommendations Home health PT  PT equipment Rolling walker with 5" wheels;3in1 (PT)  Individuals Consulted  Consulted and Agree with Results and Recommendations Patient  Acute Rehab PT Goals  Patient Stated Goal to get home  PT Goal Formulation With patient  Time For Goal Achievement 08/04/16  Potential to Achieve Goals Good  PT Time Calculation  PT Start Time (ACUTE ONLY) 1435  PT Stop Time (ACUTE ONLY) 1508  PT Time Calculation (min) (ACUTE ONLY) 33 min  PT General Charges  $$ ACUTE PT VISIT 1 Procedure  PT Evaluation  $PT Re-evaluation 1 Procedure  PT Treatments  $Therapeutic Exercise 8-22 mins  Written Expression  Dominant Hand Right  07/28/2016 Pt is doing well with her mobility, is comfortable with stairs and gait with RW.  Equipment is delivered and in room and she is waiting on her hinged knee brace (outside vendor has been called).  PT will continue to follow acutely to progress gait and mobility.  Barbarann Ehlers [place], [place], DPT [phone]

## 2016-07-28 NOTE — Progress Notes (Signed)
Orthopaedic Trauma Service (OTS)  Subjective: 1 Day Post-Op Procedure(s) (LRB): OPEN REDUCTION INTERNAL FIXATION (ORIF) TIBIAL PLATEAU (Left) Patient reports pain as mild.   Eager to work with PT.  Objective: Current Vitals Blood pressure (!) 96/43, pulse 75, temperature 98.4 F (36.9 C), temperature source Oral, resp. rate 17, SpO2 97 %. Vital signs in last 24 hours: Temp:  [97.6 F (36.4 C)-98.4 F (36.9 C)] 98.4 F (36.9 C) (04/11 0504) Pulse Rate:  [70-99] 75 (04/11 0504) Resp:  [12-17] 17 (04/11 0504) BP: (96-138)/(43-64) 96/43 (04/11 0504) SpO2:  [94 %-98 %] 97 % (04/11 0504)  Intake/Output from previous day: 04/10 0701 - 04/11 0700 In: 1540 [P.O.:240; I.V.:1200; IV Piggyback:100] Out: 1550 [Urine:800; Blood:750]  LABS  Recent Labs  07/27/16 0543  HGB 11.7*    Recent Labs  07/27/16 0543  WBC 5.9  RBC 3.88  HCT 35.3*  PLT 213    Recent Labs  07/27/16 0543 07/28/16 0449  NA 139 139  K 3.9 4.9  CL 105 106  CO2 26 26  BUN 6 5*  CREATININE 0.62 0.62  GLUCOSE 96 105*  CALCIUM 9.6  9.4 9.2   No results for input(s): LABPT, INR in the last 72 hours.  Physical Exam  LLE Dressing intact, clean, dry  Edema/ swelling controlled  Sens: DPN, SPN, TN intact  Motor: EHL, FHL, and lessor toe ext and flex all intact grossly  Brisk cap refill, warm to touch Knee resting in bent position so brought into full extension with padding  Imaging Dg Knee Left Port  Result Date: 07/27/2016 CLINICAL DATA:  Open reduction internal fixation for lateral tibial plateau fracture EXAM: PORTABLE LEFT KNEE - 1-2 VIEW COMPARISON:  Intraoperative images July 28, 2014; preoperative study July 24, 2008 FINDINGS: Frontal and lateral images were obtained. There is screw and plate fixation through the proximal tibia with screws transfixing the area of previous lateral tibial plateau depression. Alignment in this area is currently anatomic. No new fracture. No dislocation. There  is moderate narrowing of the patellofemoral joint. IMPRESSION: Postoperative change with alignment essentially anatomic at the site of the recent lateral tibial plateau depression fracture. 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 Electronically Signed   By: Lowella Grip III M.D.   On: 07/27/2016 13:42   Dg C-arm 1-60 Min  Result Date: 07/27/2016 CLINICAL DATA:  Open reduction internal fixation left tibial plateau fracture EXAM: DG C-ARM 61-120 MIN; LEFT KNEE - 3 VIEW COMPARISON:  07/23/2016 FINDINGS: Three views of the left knee submitted. The patient is status post open reduction internal fixation of lateral tibial plateau fracture. A metallic fixation plate with lateral approach is noted in proximal left tibia. Multiple fixation screws are noted proximal left tibia. There is anatomic alignment. Fluoroscopy time was 32 seconds.  Please see the operative report. IMPRESSION: The patient is status post open reduction internal fixation of lateral tibial plateau fracture. A metallic fixation plate with lateral approach is noted in proximal left tibia. Multiple fixation screws are noted proximal left tibia. There is anatomic alignment. Fluoroscopy time was 32 seconds.  Please see the operative report. Electronically Signed   By: Lahoma Crocker M.D.   On: 07/27/2016 10:59   Dg Knee 2 Views Left  Result Date: 07/27/2016 CLINICAL DATA:  Open reduction internal fixation left tibial plateau fracture EXAM: DG C-ARM 61-120 MIN; LEFT KNEE - 3 VIEW COMPARISON:  07/23/2016 FINDINGS: Three views of the left knee submitted. The patient is status post open reduction internal  fixation of lateral tibial plateau fracture. A metallic fixation plate with lateral approach is noted in proximal left tibia. Multiple fixation screws are noted proximal left tibia. There is anatomic alignment. Fluoroscopy time was 32 seconds.  Please see the operative report. IMPRESSION: The patient is status post open reduction  internal fixation of lateral tibial plateau fracture. A metallic fixation plate with lateral approach is noted in proximal left tibia. Multiple fixation screws are noted proximal left tibia. There is anatomic alignment. Fluoroscopy time was 32 seconds.  Please see the operative report. Electronically Signed   By: Lahoma Crocker M.D.   On: 07/27/2016 10:59    Assessment/Plan: 1 Day Post-Op Procedure(s) (LRB): OPEN REDUCTION INTERNAL FIXATION (ORIF) TIBIAL PLATEAU (Left) 1. NWB LLE, unrestricted ROM knee 2. DVT proph 3. D/c planning for tomorrow; late today if patient pain controlled and passes PT but tomorrow anticipated after removal of dressing  Altamese , MD [facility] [phone] (p)  07/28/2016, 10:19 AM

## 2016-07-29 MED ORDER — ENOXAPARIN (LOVENOX) PATIENT EDUCATION KIT
PACK | Freq: Once | Status: DC
  Filled 2016-07-29: qty 1

## 2016-07-29 MED ORDER — ENOXAPARIN SODIUM 40 MG/0.4ML ~~LOC~~ SOLN: 40.0000 mg | SUBCUTANEOUS | 0 refills | Status: DC

## 2016-07-29 MED ORDER — METHOCARBAMOL 500 MG PO TABS: 500.0000 mg | ORAL_TABLET | Freq: Four times a day (QID) | ORAL | 0 refills | Status: DC | PRN

## 2016-07-29 MED ORDER — DOCUSATE SODIUM 100 MG PO CAPS: 100.0000 mg | ORAL_CAPSULE | Freq: Two times a day (BID) | ORAL | 0 refills | Status: DC

## 2016-07-29 MED ORDER — HYDROCODONE-ACETAMINOPHEN 5-325 MG PO TABS: 1.0000 | ORAL_TABLET | Freq: Four times a day (QID) | ORAL | 0 refills | Status: DC | PRN

## 2016-07-29 NOTE — Progress Notes (Signed)
Orthopedic Trauma Service Progress Note    Subjective:  Doing well Ready to go home No specific complaints  Working well with PT/OT  Appetite ok + flatus Voiding w/o difficulty   Review of Systems  Constitutional: Negative for chills and fever.  Respiratory: Negative for shortness of breath and wheezing.   Cardiovascular: Negative for chest pain and palpitations.  Gastrointestinal: Negative for abdominal pain, nausea and vomiting.  Neurological: Negative for tingling and sensory change.    Objective:   VITALS:   Vitals:   07/28/16 1500 07/28/16 1700 07/28/16 2131 07/29/16 0552  BP:  (!) 105/55 117/62 116/63  Pulse:  76 79 74  Resp:  18 18 18   Temp: 99 F (37.2 C) 99 F (37.2 C) 98.4 F (36.9 C) 98.3 F (36.8 C)  TempSrc:  Oral Oral Oral  SpO2:  95% 94% 96%    Intake/Output      04/11 0701 - 04/12 0700 04/12 0701 - 04/13 0700   P.O. 240 240   I.V.     IV Piggyback     Total Intake 240 240   Urine     Blood     Total Output       Net +240 +240        Urine Occurrence 3 x 1 x     LABS  No results found for this or any previous visit (from the past 24 hour(s)).   PHYSICAL EXAM:   Gen: appears well, NAD Lungs: clear B Cardiac:RRR Abd: + BS, NTND Ext:       Left Lower Extremity   Dressing removed  Incision looks fantastic  No signs of infection  pinsites healing nicely  Swelling well controlled  Pt gets to full knee extension easily   Ext warm  + DP pulse  No DCT   Compartments soft  EHL, FHL, AT, PT, peroneals, gastroc motor intact  No pain with passive stretch   Assessment/Plan: 2 Days Post-Op   Principal Problem:   Closed fracture of lateral portion of left tibial plateau Active Problems:   Asthma, chronic   Iron deficiency anemia due to chronic blood loss   Anti-infectives    Start     Dose/Rate Route Frequency Ordered Stop   07/27/16 1400  ceFAZolin (ANCEF) IVPB 1 g/50 mL premix     1 g 100 mL/hr over  30 Minutes Intravenous Every 6 hours 07/27/16 1209 07/28/16 0400   07/27/16 0800  ceFAZolin (ANCEF) IVPB 2g/100 mL premix     2 g 200 mL/hr over 30 Minutes Intravenous  Once 07/26/16 0931 07/27/16 0812   07/24/16 0830  ceFAZolin (ANCEF) IVPB 2g/100 mL premix     2 g 200 mL/hr over 30 Minutes Intravenous On call to O.R. 07/24/16 0826 07/24/16 1049   07/24/16 0828  ceFAZolin (ANCEF) 2-4 GM/100ML-% IVPB    Comments:  Henrine Screws   : cabinet override      07/24/16 0828 07/24/16 1019    .  POD/HD#: 2  66 y/o s/p fall while walking dog with L lateral tibial plateau fracture   - comminuted L tibial plateau fracture s/p ORIF  NWB x 6 weeks  Unrestricted ROM L knee and ankle  Ice and elevate  Dressing changes as needed, ok to start showering in 2 days  No pillows under bend of knee at rest, place under ankle to elevate leg and keep knee straight  PT/OT  Hinged brace on when mobilizing    - Pain management:  norco  - ABL anemia/Hemodynamics  Stable  - Medical issues   Home meds   - DVT/PE prophylaxis:  lovenox x 3 weeks  - ID:   periop abx completed  - Metabolic Bone Disease:  Vitamin d deficiency   Probable calcium deficiency based on calcitriol and PTH values  Add supplementation at first follow up  Recommend DEXA as out pt  Highly suspect osteoporosis   - Activity:  NWB L leg  ROM as tolerated L knee  - FEN/GI prophylaxis/Foley/Lines:  Diet as tolerated   - Dispo:  Dc home today   Follow up in 2 weeks    Jari Pigg, PA-C [facility] [phone] (P) [phone] (O) 07/29/2016, 4:02 PM

## 2016-07-29 NOTE — Progress Notes (Signed)
Physical Therapy Treatment Patient Details Name: Angel Irwin MRN: [id number] DOB: 02-14-1951 Today's Date: 07/29/2016    History of Present Illness Pti s a 66 yo female admitted through ED following a fall on 07/23/16 when she was tangled up on a dogs leash. Pt sustained a L tibial plateau fx and underwent an external fixation on 07/24/16. Pt underwent Open reduction internal fixation of left lateral tibial plateau on 07/27/16. PMH significant for asthma, GERD.     PT Comments    Patient is making good progress with PT.  From a mobility standpoint anticipate patient will be ready for DC home when medically ready.    Follow Up Recommendations  Home health PT     Equipment Recommendations  Rolling walker with 5" wheels;3in1 (PT)    Recommendations for Other Services       Precautions / Restrictions Precautions Precautions: Fall Required Braces or Orthoses: Other Brace/Splint Other Brace/Splint: hinged brace Restrictions Weight Bearing Restrictions: Yes LLE Weight Bearing: Non weight bearing Other Position/Activity Restrictions: no restrictions in L knee flexion.     Mobility  Bed Mobility Overal bed mobility: Independent                Transfers Overall transfer level: Needs assistance Equipment used: Rolling walker (2 wheeled) Transfers: Sit to/from Stand Sit to Stand: Supervision         General transfer comment: demo of safe hand placement and use of AD; maintained NWB status  Ambulation/Gait Ambulation/Gait assistance: Supervision Ambulation Distance (Feet): 150 Feet Assistive device: Rolling walker (2 wheeled) Gait Pattern/deviations: Step-to pattern     General Gait Details: multiple brief standing rest breaks due to fatigue; pt able to maintain NWB status well and with no LOB   Stairs            Wheelchair Mobility    Modified Rankin (Stroke Patients Only)       Balance Overall balance assessment: Needs assistance Sitting-balance  support: Feet supported;No upper extremity supported Sitting balance-Leahy Scale: Good     Standing balance support: No upper extremity supported;Bilateral upper extremity supported;Single extremity supported Standing balance-Leahy Scale: Fair Standing balance comment: was able to stand on one leg and pull pants down with supervision.                             Cognition Arousal/Alertness: Awake/alert Behavior During Therapy: WFL for tasks assessed/performed Overall Cognitive Status: Within Functional Limits for tasks assessed                                        Exercises      General Comments General comments (skin integrity, edema, etc.): reviewed HEP, ROM, and positioning to aid in edema control for L LE      Pertinent Vitals/Pain Pain Assessment: Faces Faces Pain Scale: Hurts a little bit Pain Location: LLE with mobility Pain Descriptors / Indicators: Guarding;Aching Pain Intervention(s): Limited activity within patient's tolerance;Monitored during session;Premedicated before session;Repositioned    Home Living                      Prior Function            PT Goals (current goals can now be found in the care plan section) Acute Rehab PT Goals Patient Stated Goal: to get home PT Goal Formulation: With patient Time  For Goal Achievement: 08/04/16 Potential to Achieve Goals: Good Progress towards PT goals: Progressing toward goals    Frequency    Min 5X/week      PT Plan Current plan remains appropriate    Co-evaluation             End of Session Equipment Utilized During Treatment: Gait belt Activity Tolerance: Patient tolerated treatment well Patient left: with call bell/phone within reach;in bed Nurse Communication: Mobility status PT Visit Diagnosis: Muscle weakness (generalized) (M62.81);Difficulty in walking, not elsewhere classified (R26.2);Pain Pain - Right/Left: Left Pain - part of body: Leg      Time: 8768-1157 PT Time Calculation (min) (ACUTE ONLY): 18 min  Charges:  $Gait Training: 8-22 mins                    G Codes:       Earney Navy, PTA Pager: [phone]     Darliss Cheney 07/29/2016, 3:21 PM

## 2016-07-29 NOTE — Discharge Instructions (Signed)
Orthopaedic Trauma Service Discharge Instructions   General Discharge Instructions  WEIGHT BEARING STATUS: Nonweightbearing left leg  RANGE OF MOTION/ACTIVITY: unrestricted range of motion Left knee   Wound Care: daily wound care starting on 07/31/2016. Can leave wounds open to air if no drainage  Discharge Wound Care Instructions  Do NOT apply any ointments, solutions or lotions to pin sites or surgical wounds.  These prevent needed drainage and even though solutions like hydrogen peroxide kill bacteria, they also damage cells lining the pin sites that help fight infection.  Applying lotions or ointments can keep the wounds moist and can cause them to breakdown and open up as well. This can increase the risk for infection. When in doubt call the office.  Surgical incisions should be dressed daily.  If any drainage is noted, use one layer of adaptic, then gauze, Kerlix, and an ace wrap.  Once the incision is completely dry and without drainage, it may be left open to air out.  Showering may begin 36-48 hours later.  Cleaning gently with soap and water.  Traumatic wounds should be dressed daily as well.    One layer of adaptic, gauze, Kerlix, then ace wrap.  The adaptic can be discontinued once the draining has ceased    If you have a wet to dry dressing: wet the gauze with saline the squeeze as much saline out so the gauze is moist (not soaking wet), place moistened gauze over wound, then place a dry gauze over the moist one, followed by Kerlix wrap, then ace wrap.   PAIN MEDICATION USE AND EXPECTATIONS  You have likely been given narcotic medications to help control your pain.  After a traumatic event that results in an fracture (broken bone) with or without surgery, it is ok to use narcotic pain medications to help control one's pain.  We understand that everyone responds to pain differently and each individual patient will be evaluated on a regular basis for the continued need for  narcotic medications. Ideally, narcotic medication use should last no more than 6-8 weeks (coinciding with fracture healing).   As a patient it is your responsibility as well to monitor narcotic medication use and report the amount and frequency you use these medications when you come to your office visit.   We would also advise that if you are using narcotic medications, you should take a dose prior to therapy to maximize you participation.  IF YOU ARE ON NARCOTIC MEDICATIONS IT IS NOT PERMISSIBLE TO OPERATE A MOTOR VEHICLE (MOTORCYCLE/CAR/TRUCK/MOPED) OR HEAVY MACHINERY DO NOT MIX NARCOTICS WITH OTHER CNS (CENTRAL NERVOUS SYSTEM) DEPRESSANTS SUCH AS ALCOHOL  Diet: as you were eating previously.  Can use over the counter stool softeners and bowel preparations, such as Miralax, to help with bowel movements.  Narcotics can be constipating.  Be sure to drink plenty of fluids    STOP SMOKING OR USING NICOTINE PRODUCTS!!!!  As discussed nicotine severely impairs your body's ability to heal surgical and traumatic wounds but also impairs bone healing.  Wounds and bone heal by forming microscopic blood vessels (angiogenesis) and nicotine is a vasoconstrictor (essentially, shrinks blood vessels).  Therefore, if vasoconstriction occurs to these microscopic blood vessels they essentially disappear and are unable to deliver necessary nutrients to the healing tissue.  This is one modifiable factor that you can do to dramatically increase your chances of healing your injury.    (This means no smoking, no nicotine gum, patches, etc)  DO NOT USE NONSTEROIDAL ANTI-INFLAMMATORY DRUGS (  NSAID'S)  Using products such as Advil (ibuprofen), Aleve (naproxen), Motrin (ibuprofen) for additional pain control during fracture healing can delay and/or prevent the healing response.  If you would like to take over the counter (OTC) medication, Tylenol (acetaminophen) is ok.  However, some narcotic medications that are given for  pain control contain acetaminophen as well. Therefore, you should not exceed more than 4000 mg of tylenol in a day if you do not have liver disease.  Also note that there are may OTC medicines, such as cold medicines and allergy medicines that my contain tylenol as well.  If you have any questions about medications and/or interactions please ask your doctor/PA or your pharmacist.      ICE AND ELEVATE INJURED/OPERATIVE EXTREMITY  Using ice and elevating the injured extremity above your heart can help with swelling and pain control.  Icing in a pulsatile fashion, such as 20 minutes on and 20 minutes off, can be followed.    Do not place ice directly on skin. Make sure there is a barrier between to skin and the ice pack.    Using frozen items such as frozen peas works well as the conform nicely to the are that needs to be iced.  USE AN ACE WRAP OR TED HOSE FOR SWELLING CONTROL  In addition to icing and elevation, Ace wraps or TED hose are used to help limit and resolve swelling.  It is recommended to use Ace wraps or TED hose until you are informed to stop.    When using Ace Wraps start the wrapping distally (farthest away from the body) and wrap proximally (closer to the body)   Example: If you had surgery on your leg or thing and you do not have a splint on, start the ace wrap at the toes and work your way up to the thigh        If you had surgery on your upper extremity and do not have a splint on, start the ace wrap at your fingers and work your way up to the upper arm  IF YOU ARE IN A SPLINT OR CAST DO NOT [place]   If your splint gets wet for any reason please contact the office immediately. You may shower in your splint or cast as long as you keep it dry.  This can be done by wrapping in a cast cover or garbage back (or similar)  Do Not stick any thing down your splint or cast such as pencils, money, or hangers to try and scratch yourself with.  If you feel itchy take benadryl as  prescribed on the bottle for itching  IF YOU ARE IN A CAM BOOT (BLACK BOOT)  You may remove boot periodically. Perform daily dressing changes as noted below.  Wash the liner of the boot regularly and wear a sock when wearing the boot. It is recommended that you sleep in the boot until told otherwise  CALL THE OFFICE WITH ANY QUESTIONS OR CONCERNS: [phone]

## 2016-07-29 NOTE — Progress Notes (Signed)
Pt discharged to home via wheelchair per MD order without incident per MD order accompanied by daughter. Prior to d/c, all discharge teachings done written and verbal. All questions answered. Pt/daughter agree to comply d/c instructions. Pt discharged with prescriptions. Upon discharge, pt pain controlled and no change in pt from AM assessment.

## 2016-07-29 NOTE — Discharge Summary (Signed)
Orthopaedic Trauma Service (OTS)  Patient ID: Angel Irwin MRN: [id number] DOB/AGE: 1950/07/19 66 y.o.  Admit date: 07/23/2016 Discharge date:  07/29/2016  Admission Diagnoses: Fall Closed left lateral tibial plateau fracture Asthma Iron deficiency anemia  Discharge Diagnoses:  Principal Problem:   Closed fracture of lateral portion of left tibial plateau Active Problems:   Asthma, chronic   Iron deficiency anemia due to chronic blood loss   Vitamin D deficiency   Bone disease, metabolic   Fall   Procedures Performed: 07/24/2016- Dr. Lyla Glassing 1. Closed reduction and placement of spanning, left lower extremity, external fixator. 2. Interpretation of fluoroscopic images.  07/27/2016- Dr. Marcelino Scot 1. Open reduction internal fixation of left lateral tibial plateau. 2. Partial lateral meniscectomy. 3. Anterior compartment fasciotomy. 4. Removal of external fixator under anesthesia.    Discharged Condition: good  Hospital Course:   66 year old female was injured on 07/23/2016 all locking the family dog. Patient was admitted with isolated orthopedic injury. She was found to have a left tibial plateau fracture. Patient was seen and evaluated by the on-call orthopedist. She had fairly significant shortening of her tibia and was taken to the OR for placement of a spanning external fixator. Due to the complexity injury the orthopedic trauma service was consult for definitive management. Patient was seen and evaluated by the trauma service on 07/26/2016. She was taken back to the operating room on 07/27/2016 with the procedures noted above was performed. Patient's hospital stay was uncomplicated and she did not have any significant issues in the perioperative or postoperative periods she was covered with Lovenox for DVT and PE prophylaxis. She mobilize very well with physical therapy and occupational therapy at all points during her hospital stay. Given the low energy mechanism of  her injury we did evaluate her vitamin D levels and she was found vitamin D deficiency. She was started on supplementation for this as well. She is not on any type of supplementation for her bone health prior to admission.  Patient was covered with routine antibiotics for perioperative prophylaxis.  Patient was found to be stable on postoperative day #2 following definitive fixation. She was discharged in stable condition with home health. She was voiding without difficulty at time of discharge she was also tolerating diet without any issues.  Consults: None  Significant Diagnostic Studies: labs:  Results for Angel, Irwin (MRN [id number]) as of 08/26/2016 11:24  Ref. Range 07/27/2016 05:43 07/27/2016 05:43 07/28/2016 04:49  Sodium Latest Ref Range: 135 - 145 mmol/L 139  139  Potassium Latest Ref Range: 3.5 - 5.1 mmol/L 3.9  4.9  Chloride Latest Ref Range: 101 - 111 mmol/L 105  106  CO2 Latest Ref Range: 22 - 32 mmol/L 26  26  Glucose Latest Ref Range: 65 - 99 mg/dL 96  105 (H)  BUN Latest Ref Range: 6 - 20 mg/dL 6  5 (L)  Creatinine Latest Ref Range: 0.44 - 1.00 mg/dL 0.62  0.62  Calcium Latest Ref Range: 8.9 - 10.3 mg/dL 9.6  9.2  Anion gap Latest Ref Range: 5 - '15  8  7  ' Calcium, Ionized, Serum Latest Ref Range: 4.5 - 5.6 mg/dL 5.6    Phosphorus Latest Ref Range: 2.5 - 4.6 mg/dL 3.4    Magnesium Latest Ref Range: 1.7 - 2.4 mg/dL 1.9    Alkaline Phosphatase Latest Ref Range: 38 - 126 U/L 86    Albumin Latest Ref Range: 3.5 - 5.0 g/dL 3.0 (L)    AST Latest Ref Range: 15 -  41 U/L 38    ALT Latest Ref Range: 14 - 54 U/L 32    Total Protein Latest Ref Range: 6.5 - 8.1 g/dL 6.0 (L)    Total Bilirubin Latest Ref Range: 0.3 - 1.2 mg/dL 0.7    PREALBUMIN Latest Ref Range: 18 - 38 mg/dL 14.8 (L)    EGFR (African American) Latest Ref Range: >60 mL/min >60  >60  EGFR (Non-African Amer.) Latest Ref Range: >60 mL/min >60  >60  Vit D, 1,25-Dihydroxy Latest Ref Range: 19.9 - 79.3 pg/mL 87.6 (H)     Vitamin D, 25-Hydroxy Latest Ref Range: 30.0 - 100.0 ng/mL 14.2 (L)    WBC Latest Ref Range: 4.0 - 10.5 K/uL 5.9    RBC Latest Ref Range: 3.87 - 5.11 MIL/uL 3.88    Hemoglobin Latest Ref Range: 12.0 - 15.0 g/dL 11.7 (L)    HCT Latest Ref Range: 36.0 - 46.0 % 35.3 (L)    MCV Latest Ref Range: 78.0 - 100.0 fL 91.0    MCH Latest Ref Range: 26.0 - 34.0 pg 30.2    MCHC Latest Ref Range: 30.0 - 36.0 g/dL 33.1    RDW Latest Ref Range: 11.5 - 15.5 % 13.2    Platelets Latest Ref Range: 150 - 400 K/uL 213    PTH Unknown 60 Comment   TSH Latest Ref Range: 0.350 - 4.500 uIU/mL 3.746       Treatments: IV hydration, antibiotics: Ancef, analgesia: Dilaudid and Norco, anticoagulation: LMW heparin, therapies: PT, OT and RN and surgery: As above  Discharge Exam:     Orthopedic Trauma Service Progress Note      Subjective:   Doing well Ready to go home No specific complaints  Working well with PT/OT   Appetite ok + flatus Voiding w/o difficulty    Review of Systems  Constitutional: Negative for chills and fever.  Respiratory: Negative for shortness of breath and wheezing.   Cardiovascular: Negative for chest pain and palpitations.  Gastrointestinal: Negative for abdominal pain, nausea and vomiting.  Neurological: Negative for tingling and sensory change.      Objective:    VITALS:         Vitals:    07/28/16 1500 07/28/16 1700 07/28/16 2131 07/29/16 0552  BP:   (!) 105/55 117/62 116/63  Pulse:   76 79 74  Resp:   '18 18 18  ' Temp: 99 F (37.2 C) 99 F (37.2 C) 98.4 F (36.9 C) 98.3 F (36.8 C)  TempSrc:   Oral Oral Oral  SpO2:   95% 94% 96%      Intake/Output      04/11 0701 - 04/12 0700 04/12 0701 - 04/13 0700   P.O. 240 240   I.V.     IV Piggyback     Total Intake 240 240   Urine     Blood     Total Output       Net +240 +240        Urine Occurrence 3 x 1 x      LABS   Lab Results Last 24 Hours  No results found for this or any previous visit (from the past  24 hour(s)).       PHYSICAL EXAM:    Gen: appears well, NAD Lungs: clear B Cardiac:RRR Abd: + BS, NTND Ext:       Left Lower Extremity              Dressing removed  Incision looks fantastic             No signs of infection             pinsites healing nicely             Swelling well controlled             Pt gets to full knee extension easily              Ext warm             + DP pulse             No DCT              Compartments soft             EHL, FHL, AT, PT, peroneals, gastroc motor intact             No pain with passive stretch    Assessment/Plan: 2 Days Post-Op    Principal Problem:   Closed fracture of lateral portion of left tibial plateau Active Problems:   Asthma, chronic   Iron deficiency anemia due to chronic blood loss               Anti-infectives     Start     Dose/Rate Route Frequency Ordered Stop    07/27/16 1400   ceFAZolin (ANCEF) IVPB 1 g/50 mL premix     1 g 100 mL/hr over 30 Minutes Intravenous Every 6 hours 07/27/16 1209 07/28/16 0400    07/27/16 0800   ceFAZolin (ANCEF) IVPB 2g/100 mL premix     2 g 200 mL/hr over 30 Minutes Intravenous  Once 07/26/16 0931 07/27/16 0812    07/24/16 0830   ceFAZolin (ANCEF) IVPB 2g/100 mL premix     2 g 200 mL/hr over 30 Minutes Intravenous On call to O.R. 07/24/16 0826 07/24/16 1049    07/24/16 0828   ceFAZolin (ANCEF) 2-4 GM/100ML-% IVPB    Comments:  Henrine Screws   : cabinet override         07/24/16 0828 07/24/16 1019     .   POD/HD#: 2   66 y/o s/p fall while walking dog with L lateral tibial plateau fracture    - comminuted L tibial plateau fracture s/p ORIF             NWB x 6 weeks             Unrestricted ROM L knee and ankle             Ice and elevate             Dressing changes as needed, ok to start showering in 2 days             No pillows under bend of knee at rest, place under ankle to elevate leg and keep knee straight             PT/OT             Hinged brace  on when mobilizing      - Pain management:             norco   - ABL anemia/Hemodynamics             Stable   - Medical issues              Home meds    - DVT/PE prophylaxis:  lovenox x 3 weeks   - ID:              periop abx completed   - Metabolic Bone Disease:             Vitamin d deficiency              Probable calcium deficiency based on calcitriol and PTH values             Add supplementation at first follow up             Recommend DEXA as out pt             Highly suspect osteoporosis    - Activity:             NWB L leg             ROM as tolerated L knee   - FEN/GI prophylaxis/Foley/Lines:             Diet as tolerated    - Dispo:             Dc home today              Follow up in 2 weeks     Disposition: 01-Home or Self Care  Discharge Instructions    Call MD / Call 911    Complete by:  As directed    If you experience chest pain or shortness of breath, CALL 911 and be transported to the hospital emergency room.  If you develope a fever above 101 F, pus (white drainage) or increased drainage or redness at the wound, or calf pain, call your surgeon's office.   Constipation Prevention    Complete by:  As directed    Drink plenty of fluids.  Prune juice may be helpful.  You may use a stool softener, such as Colace (over the counter) 100 mg twice a day.  Use MiraLax (over the counter) for constipation as needed.   Diet - low sodium heart healthy    Complete by:  As directed    Discharge instructions    Complete by:  As directed    Orthopaedic Trauma Service Discharge Instructions   General Discharge Instructions  WEIGHT BEARING STATUS: Nonweightbearing left leg  RANGE OF MOTION/ACTIVITY: unrestricted range of motion Left knee   Wound Care: daily wound care starting on 07/31/2016. Can leave wounds open to air if no drainage  Discharge Wound Care Instructions  Do NOT apply any ointments, solutions or lotions to pin sites or surgical  wounds.  These prevent needed drainage and even though solutions like hydrogen peroxide kill bacteria, they also damage cells lining the pin sites that help fight infection.  Applying lotions or ointments can keep the wounds moist and can cause them to breakdown and open up as well. This can increase the risk for infection. When in doubt call the office.  Surgical incisions should be dressed daily.  If any drainage is noted, use one layer of adaptic, then gauze, Kerlix, and an ace wrap.  Once the incision is completely dry and without drainage, it may be left open to air out.  Showering may begin 36-48 hours later.  Cleaning gently with soap and water.  Traumatic wounds should be dressed daily as well.    One layer of adaptic, gauze, Kerlix, then ace wrap.  The adaptic can be discontinued once the draining has ceased    If you have a wet to dry  dressing: wet the gauze with saline the squeeze as much saline out so the gauze is moist (not soaking wet), place moistened gauze over wound, then place a dry gauze over the moist one, followed by Kerlix wrap, then ace wrap.   PAIN MEDICATION USE AND EXPECTATIONS  You have likely been given narcotic medications to help control your pain.  After a traumatic event that results in an fracture (broken bone) with or without surgery, it is ok to use narcotic pain medications to help control one's pain.  We understand that everyone responds to pain differently and each individual patient will be evaluated on a regular basis for the continued need for narcotic medications. Ideally, narcotic medication use should last no more than 6-8 weeks (coinciding with fracture healing).   As a patient it is your responsibility as well to monitor narcotic medication use and report the amount and frequency you use these medications when you come to your office visit.   We would also advise that if you are using narcotic medications, you should take a dose prior to therapy to  maximize you participation.  IF YOU ARE ON NARCOTIC MEDICATIONS IT IS NOT PERMISSIBLE TO OPERATE A MOTOR VEHICLE (MOTORCYCLE/CAR/TRUCK/MOPED) OR HEAVY MACHINERY DO NOT MIX NARCOTICS WITH OTHER CNS (CENTRAL NERVOUS SYSTEM) DEPRESSANTS SUCH AS ALCOHOL  Diet: as you were eating previously.  Can use over the counter stool softeners and bowel preparations, such as Miralax, to help with bowel movements.  Narcotics can be constipating.  Be sure to drink plenty of fluids    STOP SMOKING OR USING NICOTINE PRODUCTS!!!!  As discussed nicotine severely impairs your body's ability to heal surgical and traumatic wounds but also impairs bone healing.  Wounds and bone heal by forming microscopic blood vessels (angiogenesis) and nicotine is a vasoconstrictor (essentially, shrinks blood vessels).  Therefore, if vasoconstriction occurs to these microscopic blood vessels they essentially disappear and are unable to deliver necessary nutrients to the healing tissue.  This is one modifiable factor that you can do to dramatically increase your chances of healing your injury.    (This means no smoking, no nicotine gum, patches, etc)  DO NOT USE NONSTEROIDAL ANTI-INFLAMMATORY DRUGS (NSAID'S)  Using products such as Advil (ibuprofen), Aleve (naproxen), Motrin (ibuprofen) for additional pain control during fracture healing can delay and/or prevent the healing response.  If you would like to take over the counter (OTC) medication, Tylenol (acetaminophen) is ok.  However, some narcotic medications that are given for pain control contain acetaminophen as well. Therefore, you should not exceed more than 4000 mg of tylenol in a day if you do not have liver disease.  Also note that there are may OTC medicines, such as cold medicines and allergy medicines that my contain tylenol as well.  If you have any questions about medications and/or interactions please ask your doctor/PA or your pharmacist.      ICE AND ELEVATE  INJURED/OPERATIVE EXTREMITY  Using ice and elevating the injured extremity above your heart can help with swelling and pain control.  Icing in a pulsatile fashion, such as 20 minutes on and 20 minutes off, can be followed.    Do not place ice directly on skin. Make sure there is a barrier between to skin and the ice pack.    Using frozen items such as frozen peas works well as the conform nicely to the are that needs to be iced.  USE AN ACE WRAP OR TED HOSE FOR SWELLING CONTROL  In addition  to icing and elevation, Ace wraps or TED hose are used to help limit and resolve swelling.  It is recommended to use Ace wraps or TED hose until you are informed to stop.    When using Ace Wraps start the wrapping distally (farthest away from the body) and wrap proximally (closer to the body)   Example: If you had surgery on your leg or thing and you do not have a splint on, start the ace wrap at the toes and work your way up to the thigh        If you had surgery on your upper extremity and do not have a splint on, start the ace wrap at your fingers and work your way up to the upper arm  IF YOU ARE IN A SPLINT OR CAST DO NOT [place]   If your splint gets wet for any reason please contact the office immediately. You may shower in your splint or cast as long as you keep it dry.  This can be done by wrapping in a cast cover or garbage back (or similar)  Do Not stick any thing down your splint or cast such as pencils, money, or hangers to try and scratch yourself with.  If you feel itchy take benadryl as prescribed on the bottle for itching  IF YOU ARE IN A CAM BOOT (BLACK BOOT)  You may remove boot periodically. Perform daily dressing changes as noted below.  Wash the liner of the boot regularly and wear a sock when wearing the boot. It is recommended that you sleep in the boot until told otherwise  CALL THE OFFICE WITH ANY QUESTIONS OR CONCERNS: [phone]   Do not put a pillow under the knee.  Place it under the heel.    Complete by:  As directed    Place pillow under ankle to help elevate leg and keep leg straight when not working on motion   Driving restrictions    Complete by:  As directed    No driving   Increase activity slowly as tolerated    Complete by:  As directed      Allergies as of 07/29/2016      Reactions   No Known Allergies       Medication List    STOP taking these medications   naproxen 500 MG tablet Commonly known as:  NAPROSYN     TAKE these medications   albuterol 108 (90 Base) MCG/ACT inhaler Commonly known as:  PROVENTIL HFA;VENTOLIN HFA Inhale 2 puffs into the lungs every 6 (six) hours as needed for wheezing or shortness of breath.   docusate sodium 100 MG capsule Commonly known as:  COLACE Take 1 capsule (100 mg total) by mouth 2 (two) times daily.   enoxaparin 40 MG/0.4ML injection Commonly known as:  LOVENOX Inject 0.4 mLs (40 mg total) into the skin daily.   ferrous sulfate 325 (65 FE) MG tablet Take 325 mg by mouth daily with breakfast.   HYDROcodone-acetaminophen 5-325 MG tablet Commonly known as:  NORCO/VICODIN Take 1-2 tablets by mouth every 6 (six) hours as needed for severe pain (breakthrough pain).   methocarbamol 500 MG tablet Commonly known as:  ROBAXIN Take 1-2 tablets (500-1,000 mg total) by mouth every 6 (six) hours as needed for muscle spasms.   multivitamin with minerals Tabs tablet Take 1 tablet by mouth daily.   VITAMIN B-12 PO Take 1 tablet by mouth daily.      Follow-up Information  [facility] Follow up.   Specialty:  Home Health Services Why:  A representative from [facility] will contact you to arrange start date and time for your therapy. Contact information: [address] [place] [address] [place] [address] [phone]        Altamese Olivia, MD. Schedule an appointment as soon as possible for a visit in 2 week(s).   Specialty:  Orthopedic Surgery Why:  orthopaedic follow  up  Contact information: [place] [address] [phone]           Discharge Instructions and Plan: 66 y/o s/p fall while walking dog with L lateral tibial plateau fracture    - comminuted L tibial plateau fracture s/p ORIF             NWB x 6 weeks             Unrestricted ROM L knee and ankle             Ice and elevate             Dressing changes as needed, ok to start showering in 2 days             No pillows under bend of knee at rest, place under ankle to elevate leg and keep knee straight             PT/OT             Hinged brace on when mobilizing      - Pain management:             norco   - ABL anemia/Hemodynamics             Stable   - Medical issues              Home meds    - DVT/PE prophylaxis:             lovenox x 3 weeks   - ID:              periop abx completed   - Metabolic Bone Disease:             Vitamin d deficiency              Probable calcium deficiency based on calcitriol and PTH values             Add supplementation at first follow up             Recommend DEXA as out pt             Highly suspect osteoporosis    - Activity:             NWB L leg             ROM as tolerated L knee   - FEN/GI prophylaxis/Foley/Lines:             Diet as tolerated    - Dispo:             Dc home today              Follow up in 2 weeks   Signed:  Jari Pigg, PA-C [facility] [phone] (P) 08/26/2016, 11:30 AM

## 2016-07-30 DIAGNOSIS — K219 Gastro-esophageal reflux disease without esophagitis: Secondary | ICD-10-CM | POA: Diagnosis not present

## 2016-07-30 DIAGNOSIS — J45909 Unspecified asthma, uncomplicated: Secondary | ICD-10-CM | POA: Diagnosis not present

## 2016-07-30 DIAGNOSIS — K317 Polyp of stomach and duodenum: Secondary | ICD-10-CM | POA: Diagnosis not present

## 2016-07-30 DIAGNOSIS — D500 Iron deficiency anemia secondary to blood loss (chronic): Secondary | ICD-10-CM | POA: Diagnosis not present

## 2016-07-30 DIAGNOSIS — S82142D Displaced bicondylar fracture of left tibia, subsequent encounter for closed fracture with routine healing: Secondary | ICD-10-CM | POA: Diagnosis not present

## 2016-07-30 DIAGNOSIS — Z9981 Dependence on supplemental oxygen: Secondary | ICD-10-CM | POA: Diagnosis not present

## 2016-07-30 DIAGNOSIS — Z9181 History of falling: Secondary | ICD-10-CM | POA: Diagnosis not present

## 2016-08-02 DIAGNOSIS — D500 Iron deficiency anemia secondary to blood loss (chronic): Secondary | ICD-10-CM | POA: Diagnosis not present

## 2016-08-02 DIAGNOSIS — Z9981 Dependence on supplemental oxygen: Secondary | ICD-10-CM | POA: Diagnosis not present

## 2016-08-02 DIAGNOSIS — S82142D Displaced bicondylar fracture of left tibia, subsequent encounter for closed fracture with routine healing: Secondary | ICD-10-CM | POA: Diagnosis not present

## 2016-08-02 DIAGNOSIS — J45909 Unspecified asthma, uncomplicated: Secondary | ICD-10-CM | POA: Diagnosis not present

## 2016-08-02 DIAGNOSIS — K219 Gastro-esophageal reflux disease without esophagitis: Secondary | ICD-10-CM | POA: Diagnosis not present

## 2016-08-02 DIAGNOSIS — K317 Polyp of stomach and duodenum: Secondary | ICD-10-CM | POA: Diagnosis not present

## 2016-08-06 DIAGNOSIS — Z9981 Dependence on supplemental oxygen: Secondary | ICD-10-CM | POA: Diagnosis not present

## 2016-08-06 DIAGNOSIS — J45909 Unspecified asthma, uncomplicated: Secondary | ICD-10-CM | POA: Diagnosis not present

## 2016-08-06 DIAGNOSIS — S82142D Displaced bicondylar fracture of left tibia, subsequent encounter for closed fracture with routine healing: Secondary | ICD-10-CM | POA: Diagnosis not present

## 2016-08-06 DIAGNOSIS — K219 Gastro-esophageal reflux disease without esophagitis: Secondary | ICD-10-CM | POA: Diagnosis not present

## 2016-08-06 DIAGNOSIS — K317 Polyp of stomach and duodenum: Secondary | ICD-10-CM | POA: Diagnosis not present

## 2016-08-06 DIAGNOSIS — D500 Iron deficiency anemia secondary to blood loss (chronic): Secondary | ICD-10-CM | POA: Diagnosis not present

## 2016-08-13 DIAGNOSIS — K317 Polyp of stomach and duodenum: Secondary | ICD-10-CM | POA: Diagnosis not present

## 2016-08-13 DIAGNOSIS — D500 Iron deficiency anemia secondary to blood loss (chronic): Secondary | ICD-10-CM | POA: Diagnosis not present

## 2016-08-13 DIAGNOSIS — Z9981 Dependence on supplemental oxygen: Secondary | ICD-10-CM | POA: Diagnosis not present

## 2016-08-13 DIAGNOSIS — J45909 Unspecified asthma, uncomplicated: Secondary | ICD-10-CM | POA: Diagnosis not present

## 2016-08-13 DIAGNOSIS — S82142D Displaced bicondylar fracture of left tibia, subsequent encounter for closed fracture with routine healing: Secondary | ICD-10-CM | POA: Diagnosis not present

## 2016-08-13 DIAGNOSIS — K219 Gastro-esophageal reflux disease without esophagitis: Secondary | ICD-10-CM | POA: Diagnosis not present

## 2016-08-26 ENCOUNTER — Encounter (HOSPITAL_COMMUNITY): Payer: Self-pay | Admitting: Orthopedic Surgery

## 2016-08-26 DIAGNOSIS — E559 Vitamin D deficiency, unspecified: Secondary | ICD-10-CM

## 2016-08-26 DIAGNOSIS — M898X9 Other specified disorders of bone, unspecified site: Secondary | ICD-10-CM

## 2016-08-26 DIAGNOSIS — E889 Metabolic disorder, unspecified: Secondary | ICD-10-CM

## 2016-08-26 DIAGNOSIS — W19XXXA Unspecified fall, initial encounter: Secondary | ICD-10-CM

## 2016-08-26 HISTORY — DX: Vitamin D deficiency, unspecified: E55.9

## 2016-08-26 HISTORY — DX: Other specified disorders of bone, unspecified site: M89.8X9

## 2016-08-26 HISTORY — DX: Metabolic disorder, unspecified: E88.9

## 2016-08-27 DIAGNOSIS — K219 Gastro-esophageal reflux disease without esophagitis: Secondary | ICD-10-CM | POA: Diagnosis not present

## 2016-08-27 DIAGNOSIS — D500 Iron deficiency anemia secondary to blood loss (chronic): Secondary | ICD-10-CM | POA: Diagnosis not present

## 2016-08-27 DIAGNOSIS — J45909 Unspecified asthma, uncomplicated: Secondary | ICD-10-CM | POA: Diagnosis not present

## 2016-08-27 DIAGNOSIS — S82142D Displaced bicondylar fracture of left tibia, subsequent encounter for closed fracture with routine healing: Secondary | ICD-10-CM | POA: Diagnosis not present

## 2016-08-27 DIAGNOSIS — Z9981 Dependence on supplemental oxygen: Secondary | ICD-10-CM | POA: Diagnosis not present

## 2016-08-27 DIAGNOSIS — K317 Polyp of stomach and duodenum: Secondary | ICD-10-CM | POA: Diagnosis not present

## 2016-09-07 DIAGNOSIS — J45909 Unspecified asthma, uncomplicated: Secondary | ICD-10-CM | POA: Diagnosis not present

## 2016-09-07 DIAGNOSIS — Z9981 Dependence on supplemental oxygen: Secondary | ICD-10-CM | POA: Diagnosis not present

## 2016-09-07 DIAGNOSIS — S82142D Displaced bicondylar fracture of left tibia, subsequent encounter for closed fracture with routine healing: Secondary | ICD-10-CM | POA: Diagnosis not present

## 2016-09-07 DIAGNOSIS — K317 Polyp of stomach and duodenum: Secondary | ICD-10-CM | POA: Diagnosis not present

## 2016-09-07 DIAGNOSIS — D500 Iron deficiency anemia secondary to blood loss (chronic): Secondary | ICD-10-CM | POA: Diagnosis not present

## 2016-09-07 DIAGNOSIS — K219 Gastro-esophageal reflux disease without esophagitis: Secondary | ICD-10-CM | POA: Diagnosis not present

## 2016-12-09 NOTE — Addendum Note (Signed)
Addendum  created 12/09/16 1252 by Roberts Gaudy, MD   Sign clinical note

## 2016-12-09 NOTE — Addendum Note (Signed)
Addendum  created 12/09/16 1243 by Roberts Gaudy, MD   Sign clinical note

## 2017-01-06 ENCOUNTER — Encounter: Payer: Self-pay | Admitting: Family Medicine

## 2017-04-28 ENCOUNTER — Encounter: Payer: Self-pay | Admitting: Family Medicine

## 2017-07-19 ENCOUNTER — Encounter: Payer: Self-pay | Admitting: Family Medicine

## 2017-07-19 ENCOUNTER — Ambulatory Visit (INDEPENDENT_AMBULATORY_CARE_PROVIDER_SITE_OTHER): Payer: Medicare Other | Admitting: Family Medicine

## 2017-07-19 VITALS — BP 100/64 | HR 64 | Temp 97.6°F | Ht 61.75 in | Wt 172.0 lb

## 2017-07-19 DIAGNOSIS — Z862 Personal history of diseases of the blood and blood-forming organs and certain disorders involving the immune mechanism: Secondary | ICD-10-CM

## 2017-07-19 DIAGNOSIS — R739 Hyperglycemia, unspecified: Secondary | ICD-10-CM | POA: Diagnosis not present

## 2017-07-19 DIAGNOSIS — E538 Deficiency of other specified B group vitamins: Secondary | ICD-10-CM | POA: Diagnosis not present

## 2017-07-19 DIAGNOSIS — Z6831 Body mass index (BMI) 31.0-31.9, adult: Secondary | ICD-10-CM | POA: Diagnosis not present

## 2017-07-19 DIAGNOSIS — R5383 Other fatigue: Secondary | ICD-10-CM

## 2017-07-19 LAB — CBC
HCT: 40.6 % (ref 36.0–46.0)
HEMOGLOBIN: 13.7 g/dL (ref 12.0–15.0)
MCHC: 33.8 g/dL (ref 30.0–36.0)
MCV: 90.7 fl (ref 78.0–100.0)
PLATELETS: 299 10*3/uL (ref 150.0–400.0)
RBC: 4.48 Mil/uL (ref 3.87–5.11)
RDW: 13.3 % (ref 11.5–15.5)
WBC: 6.8 10*3/uL (ref 4.0–10.5)

## 2017-07-19 LAB — BASIC METABOLIC PANEL
BUN: 15 mg/dL (ref 6–23)
CHLORIDE: 103 meq/L (ref 96–112)
CO2: 28 mEq/L (ref 19–32)
CREATININE: 0.69 mg/dL (ref 0.40–1.20)
Calcium: 10.2 mg/dL (ref 8.4–10.5)
GFR: 90.32 mL/min (ref >60.00)
Glucose, Bld: 85 mg/dL (ref 70–99)
POTASSIUM: 4.2 meq/L (ref 3.5–5.1)
Sodium: 136 mEq/L (ref 135–145)

## 2017-07-19 LAB — TSH: TSH: 3.03 u[IU]/mL (ref 0.35–4.50)

## 2017-07-19 LAB — HEMOGLOBIN A1C: Hgb A1c MFr Bld: 5.6 % (ref 4.6–6.5)

## 2017-07-19 LAB — VITAMIN B12: Vitamin B-12: 466 pg/mL (ref 211–911)

## 2017-07-19 NOTE — Patient Instructions (Signed)
BEFORE YOU LEAVE: -Labs -follow up: Annual wellness visit with Manuela Schwartz and follow-up with me in 1-2 months  We have ordered labs or studies at this visit. It can take up to 1-2 weeks for results and processing. IF results require follow up or explanation, we will call you with instructions. Clinically stable results will be released to your [facility]. If you have not heard from [place] or cannot find your results in [facility] in 2 weeks please contact our office at [phone].  If you are not yet signed up for [facility], please consider signing up.  Schedule your mammogram  Try to get regular exercise and eat a healthy diet

## 2017-07-19 NOTE — Progress Notes (Signed)
HPI:  Using dictation device. Unfortunately this device frequently misinterprets words/phrases.  Acute visit for feeling more tired than usual: -Started 3 weeks ago -Feels tired in the afternoon -She has had minimal trouble with her allergies and some increased urinary frequency -Denies changes in her bowels, chest pain or shortness of breath with activity, palpitations, dysuria, fevers, weight loss, bleeding, shortness of breath, excessive cough or wheezing -She has a history of anemia -She has a family history of diabetes and is concerned about hyperglycemia -She used to take B12 -She would like to check some labs -Reports her mood has been good, working part-time and low stress job -Does not feel she has sleep apnea -Plans to schedule her mammogram and her physical -Active with her grandkids  ROS: See pertinent positives and negatives per HPI.  Past Medical History:  Diagnosis Date  . Adenomatous colon polyp 09/04/2014  . Asthma    albuterol-rescue inhaler-uses 1-2 x year  . Bone disease, metabolic 7/85/8850  . [name] lesion, chronic    on sm bowel capsule 06/2014  . Complication of anesthesia    slow to awaken  . Fundic gland polyps of stomach, benign   . GERD (gastroesophageal reflux disease)    no meds  . Hiatal hernia   . 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 Vitamin D deficiency 08/26/2016    Past Surgical History:  Procedure Laterality Date  . ANTERIOR AND POSTERIOR REPAIR  02/22/2011   Procedure: ANTERIOR (CYSTOCELE) AND POSTERIOR REPAIR (RECTOCELE);  Surgeon: Delice Lesch, MD;  Location: [facility];  Service: Gynecology;  Laterality: N/A;  . COLONOSCOPY    . CYSTOSCOPY  02/22/2011   Procedure: CYSTOSCOPY;  Surgeon: Delice Lesch, MD;  Location: [facility];  Service: Gynecology;  Laterality: N/A;  . DILATION AND CURETTAGE OF UTERUS  1980  . EXTERNAL FIXATION LEG Left 07/24/2016   Procedure: EXTERNAL FIXATION LEFT LOWER  LEG;  Surgeon: Rod Can, MD;  Location: [place];  Service: Orthopedics;  Laterality: Left;  . ORIF TIBIA PLATEAU Left 07/27/2016   Procedure: OPEN REDUCTION INTERNAL FIXATION (ORIF) TIBIAL PLATEAU;  Surgeon: Altamese Riverdale, MD;  Location: [facility];  Service: Orthopedics;  Laterality: Left;  . SALPINGOOPHORECTOMY  02/22/2011   Procedure: SALPINGO OOPHERECTOMY;  Surgeon: Delice Lesch, MD;  Location: [facility];  Service: Gynecology;  Laterality: Bilateral;  . VAGINAL HYSTERECTOMY  02/22/2011   Procedure: HYSTERECTOMY VAGINAL;  Surgeon: Delice Lesch, MD;  Location: [facility];  Service: Gynecology;  Laterality: N/A;  . WISDOM TOOTH EXTRACTION      Family History  Problem Relation Age of Onset  . Diabetes Father   . Lung cancer Sister 53  . Heart disease Maternal Grandmother   . Heart disease Maternal Grandfather   . Kidney disease Maternal Grandfather   . Colon cancer Neg Hx   . Colon polyps Neg Hx   . Esophageal cancer Neg Hx   . Gallbladder disease Neg Hx     SOCIAL HX: See HPI    Current Outpatient Medications:  .  albuterol (PROVENTIL HFA;VENTOLIN HFA) 108 (90 BASE) MCG/ACT inhaler, Inhale 2 puffs into the lungs every 6 (six) hours as needed for wheezing or shortness of breath., Disp: 1 Inhaler, Rfl: 3 .  Cholecalciferol (VITAMIN D PO), Take by mouth daily., Disp: , Rfl:  .  enoxaparin (LOVENOX) 40 MG/0.4ML injection, Inject 0.4 mLs (40 mg total) into the skin daily., Disp: 21 Syringe, Rfl: 0 .  ferrous sulfate 325 (65 FE) MG tablet, Take 325 mg by mouth daily with breakfast., Disp: , Rfl:  .  Multiple Vitamin (MULTIVITAMIN WITH MINERALS) TABS tablet, Take 1 tablet by mouth daily., Disp: , Rfl:   EXAM:  Vitals:   07/19/17 1253  BP: 100/64  Pulse: 64  Temp: 97.6 F (36.4 C)  SpO2: 98%    Body mass index is 31.71 kg/m.  GENERAL: vitals reviewed and listed above, alert, oriented, appears well hydrated and in no acute distress  HEENT: atraumatic, conjunttiva clear, no  obvious abnormalities on inspection of external nose and ears, boggy pale turbinates, cobblestoning in the posterior oropharynx, clear effusions behind the TMs bilaterally, allergic shiners  NECK: no obvious masses on inspection, no significant lymphadenopathy  LUNGS: clear to auscultation bilaterally, no wheezes, rales or rhonchi, good air movement  CV: HRRR, no peripheral edema  Abdomen: Bowel sounds positive, soft, nontender to palpation  MS: moves all extremities without noticeable abnormality  PSYCH: pleasant and cooperative, no obvious depression or anxiety  ASSESSMENT AND PLAN:  Discussed the following assessment and plan:  Tired - Plan: Basic metabolic panel  History of anemia - Plan: CBC  BMI 31.0-31.9,adult  B12 deficiency - Plan: Vitamin B12  Hyperglycemia - Plan: Hemoglobin A1c  Other fatigue - Plan: CBC, TSH  -we discussed possible serious and likely etiologies, workup and treatment, treatment risks and return precautions -after this discussion, Monique opted for starting with labs per orders, increasing exercise, healthy diet, AWV/Follow up in 1-2 months -of course, we advised Talyn  to return or notify a doctor immediately if symptoms worsen or persist or new concerns arise.    Patient Instructions  BEFORE YOU LEAVE: -Labs -follow up: Annual wellness visit with Manuela Schwartz and follow-up with me in 1-2 months  We have ordered labs or studies at this visit. It can take up to 1-2 weeks for results and processing. IF results require follow up or explanation, we will call you with instructions. Clinically stable results will be released to your [facility]. If you have not heard from [place] or cannot find your results in [facility] in 2 weeks please contact our office at [phone].  If you are not yet signed up for [facility], please consider signing up.  Schedule your mammogram  Try to get regular exercise and eat a healthy diet         Angel Kern, DO

## 2017-07-28 NOTE — Progress Notes (Signed)
Subjective:   Angel Irwin is a 67 y.o. female who presents for an Initial Medicare Annual Wellness Visit.  MS. Sanmiguel IN FOR AWV; DURING THE VISIT SHE NOTED SHE WAS STARTED ON MEDICARE PART B IN MARCH OF THIS YEAR. SHE IS DUE HER WELCOME TO MEDICARE VISIT. WILL CONSIDER THIS A NURSE VISIT ORDERS SENT TO DR Maudie Mercury IN B[place]ET NOTE FOR MAMMOGRAM AND DEXA AS WELL AS ALBUTEROL REFILL  APT VISIT 5/6 AND 5/23 WITH DR. Maudie Mercury  CALL TO [place] TO HOLD UNTIL DR Maudie Mercury RESPONDS  Last OV 4/2 with Dr. Maudie Mercury  CPE scheduled for 08/22/2017 fx of left tibia- 07/23/2016 Dr. 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 DEXA SCAN  01/04/2016  . MAMMOGRAM  07/07/2016  . PNA vac Low Risk Adult (2 of 2 - PPSV23) 02/18/2017   Colonoscopy 05/2014 Mammogram 06/2014  - is overdue due to your injury   [facility]; will order mammogram and dexa;   Dexa scan ordered   Vit D level 14.2 per note from last year when she had orthopedic surgery  Needs PSV 23  Educated regarding shingrix   Cardiac Risk Factors include: advanced age (>4men, >41 women);family history of premature cardiovascular disease;obesity (BMI >30kg/m2)     Objective:    Today's Vitals   07/29/17 0808  BP: 130/70  Pulse: 72  SpO2: 97%  Weight: 172 lb 3 oz (78.1 kg)  Height: 5\' 1"  (1.549 m)   Body mass index is 32.53 kg/m.  Advanced Directives 07/29/2017 07/23/2016 01/01/2015 09/25/2014 07/23/2014 02/22/2011 02/16/2011  Does Patient Have a Medical Advance Directive? Yes No No No;Yes Yes Patient does not have advance directive Patient does  not have advance directive  Type of Advance Directive - - - [place];Living will - - -  Copy of [place] in Chart? - - - - No - copy requested - -  Would patient like information on creating a medical advance directive? - No - Patient declined - - - - -  Pre-existing out of facility DNR order (yellow form or pink MOST form) - - - - - No -    Current Medications (verified) Outpatient Encounter Medications as of 07/29/2017  Medication Sig  . albuterol (PROVENTIL HFA;VENTOLIN HFA) 108 (90 BASE) MCG/ACT inhaler Inhale 2 puffs into the lungs every 6 (six) hours as needed for wheezing or shortness of breath.  . Cholecalciferol (VITAMIN D PO) Take by mouth daily.  Marland Kitchen enoxaparin (LOVENOX) 40 MG/0.4ML injection Inject 0.4 mLs (40 mg total) into the skin daily.  . ferrous sulfate 325 (65 FE) MG tablet Take 325 mg by mouth daily with breakfast.  . Multiple Vitamin (MULTIVITAMIN WITH MINERALS) TABS tablet Take 1 tablet by mouth daily.   No facility-administered encounter medications on file as of 07/29/2017.     Allergies (verified) No known allergies   History: Past Medical History:  Diagnosis Date  . Adenomatous colon polyp 09/04/2014  . Asthma  albuterol-rescue inhaler-uses 1-2 x year  . Bone disease, metabolic 8/50/2774  . [name] lesion, chronic    on sm bowel capsule 06/2014  . Complication of anesthesia    slow to awaken  . Fundic gland polyps of stomach, benign   . GERD (gastroesophageal reflux disease)    no meds  . Hiatal hernia   . 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 Vitamin D deficiency 08/26/2016   Past Surgical History:  Procedure Laterality Date  . ANTERIOR AND POSTERIOR REPAIR  02/22/2011   Procedure: ANTERIOR (CYSTOCELE) AND POSTERIOR REPAIR (RECTOCELE);  Surgeon: Delice Lesch, MD;  Location: [facility];  Service: Gynecology;  Laterality: N/A;  . COLONOSCOPY    . CYSTOSCOPY  02/22/2011     Procedure: CYSTOSCOPY;  Surgeon: Delice Lesch, MD;  Location: [facility];  Service: Gynecology;  Laterality: N/A;  . DILATION AND CURETTAGE OF UTERUS  1980  . EXTERNAL FIXATION LEG Left 07/24/2016   Procedure: EXTERNAL FIXATION LEFT LOWER LEG;  Surgeon: Rod Can, MD;  Location: [facility];  Service: Orthopedics;  Laterality: Left;  . ORIF TIBIA PLATEAU Left 07/27/2016   Procedure: OPEN REDUCTION INTERNAL FIXATION (ORIF) TIBIAL PLATEAU;  Surgeon: Altamese Hoytville, MD;  Location: [place];  Service: Orthopedics;  Laterality: Left;  . SALPINGOOPHORECTOMY  02/22/2011   Procedure: SALPINGO OOPHERECTOMY;  Surgeon: Delice Lesch, MD;  Location: [facility];  Service: Gynecology;  Laterality: Bilateral;  . VAGINAL HYSTERECTOMY  02/22/2011   Procedure: HYSTERECTOMY VAGINAL;  Surgeon: Delice Lesch, MD;  Location: [facility];  Service: Gynecology;  Laterality: N/A;  . WISDOM TOOTH EXTRACTION     Family History  Problem Relation Age of Onset  . Diabetes Father   . Lung cancer Sister 58  . Heart disease Maternal Grandmother   . Heart disease Maternal Grandfather   . Kidney disease Maternal Grandfather   . Colon cancer Neg Hx   . Colon polyps Neg Hx   . Esophageal cancer Neg Hx   . Gallbladder disease Neg Hx    Social History   Socioeconomic History  . Marital status: Married    Spouse name: Not on file  . Number of children: 4  . Years of education: Not on file  . Highest education level: Not on file  Occupational History  . Occupation: [employer]  Social Needs  . Financial resource strain: Not on file  . Food insecurity:    Worry: Not on file    Inability: Not on file  . Transportation needs:    Medical: Not on file    Non-medical: Not on file  Tobacco Use  . Smoking status: Never Smoker  . Smokeless tobacco: Never Used  Substance and Sexual Activity  . Alcohol use: Yes    Alcohol/week: 0.0 oz    Comment: Rarely  . Drug use: No  . Sexual activity: Not on file  Lifestyle  .  Physical activity:    Days per week: Not on file    Minutes per session: Not on file  . Stress: Not on file  Relationships  . Social connections:    Talks on phone: Not on file    Gets together: Not on file    Attends religious service: Not on file    Active member of club or organization: Not on file    Attends meetings of clubs or organizations: Not on file    Relationship status: Not on file  Other Topics Concern  . 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 N  Vision? N  Difficulty concentrating or making decisions? N  Walking or climbing stairs? N  Dressing or bathing? N  Doing errands, shopping? N  Preparing Food and eating ? N  Using the Toilet? N  In the past six months, have you accidently leaked urine? N  Do you have problems with loss of bowel control? N  Managing your Medications? N  Managing your Finances? N  Housekeeping or managing your Housekeeping? N  Some recent data might be hidden     Immunizations and Health Maintenance Immunization History  Administered Date(s) Administered  . Influenza, High Dose Seasonal PF 02/19/2016  . Influenza,inj,Quad PF,6+ Mos 05/04/2013, 04/08/2014, 05/28/2015  . Pneumococcal Conjugate-13 02/19/2016  . Tdap 05/04/2013   Health Maintenance Due  Topic Date Due  . DEXA SCAN  01/04/2016  . MAMMOGRAM  07/07/2016  . PNA vac Low Risk Adult (2 of 2 - PPSV23) 02/18/2017    Patient Care Team: Lucretia Kern, DO as PCP - General (Family Medicine)  Indicate any recent Medical Services you may have received from other than [facility]  providers in the past year (date may be approximate).     Assessment:   This is a routine wellness examination for Angel Irwin.  Hearing/Vision screen Hearing Screening Comments: Hearing is good Can hear a whisper  No issues Vision Screening Comments: Vision wears glasses Has not changed in the last 10years My eye doctor Was last seen one year ago  Dietary issues and exercise activities discussed: Current Exercise Habits: Home exercise routine, Type of exercise: walking(6000 steps a day), Frequency (Times/Week): 5  Goals    . Weight (lb) < 160 lb (72.6 kg)     Plan is: Plan you 1/2 diet  Will try to cut back on portions  Check out  online nutrition programs as GumSearch.nl and http://vang.com/; fit46me; Look for foods with "whole" wheat; bran; oatmeal etc Shot at the farmer's markets in season for fresher choices  Watch for "hydrogenated" on the label of oils which are trans-fats.  Watch for "high fructose corn syrup" in snacks, yogurt or ketchup  Meats have less marbling; bright colored fruits and vegetables;  Canned; dump out liquid and wash vegetables. Be mindful of what we are eating  Portion control is essential to a health weight! Sit down; take a break and enjoy your meal; take smaller bites; put the fork down between bites;  It takes 20 minutes to get full; so check in with your fullness cues and stop eating when you start to fill full             Depression Screen PHQ 2/9 Scores 07/29/2017  PHQ - 2 Score 0    Fall Risk Fall Risk  07/29/2017  Falls in the past year? Yes  Comment injured in April      Cognitive Function: MMSE - Mini Mental State Exam 07/29/2017  Not completed: (No Data)     Ad8 score reviewed for issues:  Issues making decisions:  Less interest in hobbies / activities:  Repeats questions, stories (family complaining):  Trouble using ordinary gadgets (microwave, computer, phone):  Forgets the month or year:   Mismanaging  finances:   Remembering appts:  Daily problems with thinking and/or memory: Ad8 score is=0        Screening Tests Health Maintenance  Topic Date Due  . DEXA SCAN  01/04/2016  . MAMMOGRAM  07/07/2016  . PNA vac Low Risk Adult (2 of 2 - PPSV23) 02/18/2017  . INFLUENZA VACCINE  11/17/2017  . COLONOSCOPY  06/14/2019  . TETANUS/TDAP  05/05/2023  . Hepatitis C Screening  Completed       Plan:     PCP Notes   Health Maintenance   Abnormal Screens    Referrals    Patient concerns; Needs albuterol refilled - refilled today for prn use as directed  Vit d level was low (14.2) when checked during fx recovery. This was not followed up; Taking 800 vit D and will discuss with Dr. Maudie Mercury with her apt 05/23  Nurse Concerns; AWV completed when the patient noted she had rec'd information regarding her Welcome to Medicare. To note, she was placed on Part B in March of this year, so she is eligible for her Welcome to Medicare visit.    Next PCP apt May 23rd /AWV  Left apt for 5/6    I have personally reviewed and noted the following in the patient's chart:   . Medical and social history . Use of alcohol, tobacco or illicit drugs  . Current medications and supplements . Functional ability and status . Nutritional status . Physical activity . Advanced directives . List of other physicians . Hospitalizations, surgeries, and ER visits in previous 12 months . Vitals . Screenings to include cognitive, depression, and falls . Referrals and appointments  In addition, I have reviewed and discussed with patient certain preventive protocols, quality metrics, and best practice recommendations. A written personalized care plan for preventive services as well as general preventive health recommendations were provided to patient.     Wynetta Fines, RN   07/29/2017

## 2017-07-29 ENCOUNTER — Telehealth: Payer: Self-pay

## 2017-07-29 ENCOUNTER — Ambulatory Visit: Payer: Medicare Other

## 2017-07-29 VITALS — BP 130/70 | HR 72 | Ht 61.0 in | Wt 172.2 lb

## 2017-07-29 NOTE — Telephone Encounter (Signed)
Dr. Maudie Mercury, This patient was in for a AWV 4/12, but at the end of the visit, she stated she had started on part B in March of this year.  I have re-scheduled her for a Welcome to medicare visit with you on 5/6 and 5/23 for fup for fatigue and labs and welcome to medicare respectively.  I ordered her dexa and mammogram at this visit and left these orders in.  She had fx last year and as not had dexa and was overdue for her mammogram.  (she also was given information that he vit d was 14 of which no one followed up with her post op ORIF of tibial plate 07/27/16.  She also request a refill on her albuterol which she uses prn.   Please advise -  I will outreach the patient as advised   Manuela Schwartz h

## 2017-07-29 NOTE — Progress Notes (Signed)
Angel Parke R Celestia Duva, DO  

## 2017-07-29 NOTE — Telephone Encounter (Signed)
Follow call to advise that I am awaiting dr. Maudie Mercury to fup on her orders for dexa, mammogram and albuterol refill. I will follow back up with her next week.

## 2017-07-29 NOTE — Telephone Encounter (Signed)
[facility] good! Thank you! Ok to send refil abuterol and recommend source natruals vit d3 drops, cosco or sams club brand vit D3 1000 IU every day. Thanks!

## 2017-07-29 NOTE — Patient Instructions (Addendum)
[name] , Thank you for taking time to come for your Medicare Wellness Visit. I appreciate your ongoing commitment to your health goals. Please review the following plan we discussed and let me know if I can assist you in the future.   Will order your mammogram with a dexa today   Will take your pneumovax today; this is your last pneumonia vaccine The Centers for Disease Control are now recommending 2 pneumonia vaccinations after 85. The first is the Prevnar 13. This helps to boost your immunity to community acquired pneumonia as well as some protection from bacterial pneumonia  The 2nd is the pneumovax 23, which offers more broad protection!  Please consider taking these as this is your best protection against pneumonia.  Angel Irwin will reorder your Albuterol   Ask Dr. Maudie Mercury about taking shingrix in the office since you are out of pocket for it.  Shingrix is a vaccine for the prevention of Shingles in Adults 50 and older.  If you are on Medicare, the shingrix is covered under your Part D plan, so you will take both of the vaccines in the series at your pharmacy. Please check with your benefits regarding applicable copays or out of pocket expenses.  The Shingrix is given in 2 vaccines approx 8 weeks apart. You must receive the 2nd dose prior to 6 months from receipt of the first. Please have the pharmacist print out you Immunization  dates for our office records    Go to the osteoporosis foundation.org for more information Recommendations for Dexa Scan Female over the age of 61 Man age 70 or older If you broke a bone past the age of 16 Women menopausal age with risk factors (thin frame; smoker; hx of fx ) Post menopausal women under the age of 26 with risk factors A man age 25 to 28 with risk factors Other: Spine xray that is showing break of bone loss Back pain with possible break Height loss of 1/2 inch or more within one year Total loss in height of 1.5 inches from your original  height  Calcium 1275m with Vit D 800u per day; more as directed by physician Strength building exercises discussed; can include walking; housework; small weights or stretch bands; silver sneakers if access to the [facility]    These are the goals we discussed: Goals    . Weight (lb) < 160 lb (72.6 kg)     Plan is: Plan you 1/2 diet  Will try to cut back on portions  Check out  online nutrition programs as cGumSearch.[place] mhttp://vang.com/ fit263m Look for foods with "whole" wheat; bran; oatmeal etc Shot at the farmer's markets in season for fresher choices  Watch for "hydrogenated" on the label of oils which are trans-fats.  Watch for "high fructose corn syrup" in snacks, yogurt or ketchup  Meats have less marbling; bright colored fruits and vegetables;  Canned; dump out liquid and wash vegetables. Be mindful of what we are eating  Portion control is essential to a health weight! Sit down; take a break and enjoy your meal; take smaller bites; put the fork down between bites;  It takes 20 minutes to get full; so check in with your fullness cues and stop eating when you start to fill full              This is a list of the screening recommended for you and due dates:  Health Maintenance  Topic Date Due  . DEXA scan (bone density  measurement)  01/04/2016  . Mammogram  07/07/2016  . Pneumonia vaccines (2 of 2 - PPSV23) 02/18/2017  . Flu Shot  11/17/2017  . Colon Cancer Screening  06/14/2019  . Tetanus Vaccine  05/05/2023  .  Hepatitis C: One time screening is recommended by Center for Disease Control  (CDC) for  adults born from [birth year] through 1965.   Completed     Bone Densitometry Bone densitometry is an imaging test that uses a special X-ray to measure the amount of calcium and other minerals in your bones (bone density). This test is also known as a bone mineral density test or dual-energy X-ray absorptiometry (DXA). The test can measure bone density at your hip and your  spine. It is similar to having a regular X-ray. You may have this test to:  Diagnose a condition that causes weak or thin bones (osteoporosis).  Predict your risk of a broken bone (fracture).  Determine how well osteoporosis treatment is working.  Tell a health care provider about:  Any allergies you have.  All medicines you are taking, including vitamins, herbs, eye drops, creams, and over-the-counter medicines.  Any problems you or family members have had with anesthetic medicines.  Any blood disorders you have.  Any surgeries you have had.  Any medical conditions you have.  Possibility of pregnancy.  Any other medical test you had within the previous 14 days that used contrast material. What are the risks? Generally, this is a safe procedure. However, problems can occur and may include the following:  This test exposes you to a very small amount of radiation.  The risks of radiation exposure may be greater to unborn children.  What happens before the procedure?  Do not take any calcium supplements for 24 hours before having the test. You can otherwise eat and drink what you usually do.  Take off all metal jewelry, eyeglasses, dental appliances, and any other metal objects. What happens during the procedure?  You may lie on an exam table. There will be an X-ray generator below you and an imaging device above you.  Other devices, such as boxes or braces, may be used to position your body properly for the scan.  You will need to lie still while the machine slowly scans your body.  The images will show up on a computer monitor. What happens after the procedure? You may need more testing at a later time. This information is not intended to replace advice given to you by your health care provider. Make sure you discuss any questions you have with your health care provider. Document Released: 04/27/2004 Document Revised: 09/11/2015 Document Reviewed: 09/13/2013 Elsevier  Interactive Patient Education  2018 [place] in the Home Falls can cause injuries. They can happen to people of all ages. There are many things you can do to make your home safe and to help prevent falls. What can I do on the outside of my home?  Regularly fix the edges of walkways and driveways and fix any cracks.  Remove anything that might make you trip as you walk through a door, such as a raised step or threshold.  Trim any bushes or trees on the path to your home.  Use bright outdoor lighting.  Clear any walking paths of anything that might make someone trip, such as rocks or tools.  Regularly check to see if handrails are loose or broken. Make sure that both sides of any steps have handrails.  Any raised  decks and porches should have guardrails on the edges.  Have any leaves, snow, or ice cleared regularly.  Use sand or salt on walking paths during winter.  Clean up any spills in your garage right away. This includes oil or grease spills. What can I do in the bathroom?  Use night lights.  Install grab bars by the toilet and in the tub and shower. Do not use towel bars as grab bars.  Use non-skid mats or decals in the tub or shower.  If you need to sit down in the shower, use a plastic, non-slip stool.  Keep the floor dry. Clean up any water that spills on the floor as soon as it happens.  Remove soap buildup in the tub or shower regularly.  Attach bath mats securely with double-sided non-slip rug tape.  Do not have throw rugs and other things on the floor that can make you trip. What can I do in the bedroom?  Use night lights.  Make sure that you have a light by your bed that is easy to reach.  Do not use any sheets or blankets that are too big for your bed. They should not hang down onto the floor.  Have a firm chair that has side arms. You can use this for support while you get dressed.  Do not have throw rugs and other things on  the floor that can make you trip. What can I do in the kitchen?  Clean up any spills right away.  Avoid walking on wet floors.  Keep items that you use a lot in easy-to-reach places.  If you need to reach something above you, use a strong step stool that has a grab bar.  Keep electrical cords out of the way.  Do not use floor polish or wax that makes floors slippery. If you must use wax, use non-skid floor wax.  Do not have throw rugs and other things on the floor that can make you trip. What can I do with my stairs?  Do not leave any items on the stairs.  Make sure that there are handrails on both sides of the stairs and use them. Fix handrails that are broken or loose. Make sure that handrails are as long as the stairways.  Check any carpeting to make sure that it is firmly attached to the stairs. Fix any carpet that is loose or worn.  Avoid having throw rugs at the top or bottom of the stairs. If you do have throw rugs, attach them to the floor with carpet tape.  Make sure that you have a light switch at the top of the stairs and the bottom of the stairs. If you do not have them, ask someone to add them for you. What else can I do to help prevent falls?  Wear shoes that: ? Do not have high heels. ? Have rubber bottoms. ? Are comfortable and fit you well. ? Are closed at the toe. Do not wear sandals.  If you use a stepladder: ? Make sure that it is fully opened. Do not climb a closed stepladder. ? Make sure that both sides of the stepladder are locked into place. ? Ask someone to hold it for you, if possible.  Clearly mark and make sure that you can see: ? Any grab bars or handrails. ? First and last steps. ? Where the edge of each step is.  Use tools that help you move around (mobility aids) if they are needed.  These include: ? Canes. ? Walkers. ? Scooters. ? Crutches.  Turn on the lights when you go into a dark area. Replace any light bulbs as soon as they burn  out.  Set up your furniture so you have a clear path. Avoid moving your furniture around.  If any of your floors are uneven, fix them.  If there are any pets around you, be aware of where they are.  Review your medicines with your doctor. Some medicines can make you feel dizzy. This can increase your chance of falling. Ask your doctor what other things that you can do to help prevent falls. This information is not intended to replace advice given to you by your health care provider. Make sure you discuss any questions you have with your health care provider. Document Released: 01/30/2009 Document Revised: 09/11/2015 Document Reviewed: 05/10/2014 Elsevier Interactive Patient Education  2018 [place] Maintenance, Female Adopting a healthy lifestyle and getting preventive care can go a long way to promote health and wellness. Talk with your health care provider about what schedule of regular examinations is right for you. This is a good chance for you to check in with your provider about disease prevention and staying healthy. In between checkups, there are plenty of things you can do on your own. Experts have done a lot of research about which lifestyle changes and preventive measures are most likely to keep you healthy. Ask your health care provider for more information. Weight and diet Eat a healthy diet  Be sure to include plenty of vegetables, fruits, low-fat dairy products, and lean protein.  Do not eat a lot of foods high in solid fats, added sugars, or salt.  Get regular exercise. This is one of the most important things you can do for your health. ? Most adults should exercise for at least 150 minutes each week. The exercise should increase your heart rate and make you sweat (moderate-intensity exercise). ? Most adults should also do strengthening exercises at least twice a week. This is in addition to the moderate-intensity exercise.  Maintain a healthy weight  Body  mass index (BMI) is a measurement that can be used to identify possible weight problems. It estimates body fat based on height and weight. Your health care provider can help determine your BMI and help you achieve or maintain a healthy weight.  For females 78 years of age and older: ? A BMI below 18.5 is considered underweight. ? A BMI of 18.5 to 24.9 is normal. ? A BMI of 25 to 29.9 is considered overweight. ? A BMI of 30 and above is considered obese.  Watch levels of cholesterol and blood lipids  You should start having your blood tested for lipids and cholesterol at 67 years of age, then have this test every 5 years.  You may need to have your cholesterol levels checked more often if: ? Your lipid or cholesterol levels are high. ? You are older than 67 years of age. ? You are at high risk for heart disease.  Cancer screening Lung Cancer  Lung cancer screening is recommended for adults 13-66 years old who are at high risk for lung cancer because of a history of smoking.  A yearly low-dose CT scan of the lungs is recommended for people who: ? Currently smoke. ? Have quit within the past 15 years. ? Have at least a 30-pack-year history of smoking. A pack year is smoking an average of one pack of cigarettes a  day for 1 year.  Yearly screening should continue until it has been 15 years since you quit.  Yearly screening should stop if you develop a health problem that would prevent you from having lung cancer treatment.  Breast Cancer  Practice breast self-awareness. This means understanding how your breasts normally appear and feel.  It also means doing regular breast self-exams. Let your health care provider know about any changes, no matter how small.  If you are in your 20s or 30s, you should have a clinical breast exam (CBE) by a health care provider every 1-3 years as part of a regular health exam.  If you are 69 or older, have a CBE every year. Also consider having a  breast X-ray (mammogram) every year.  If you have a family history of breast cancer, talk to your health care provider about genetic screening.  If you are at high risk for breast cancer, talk to your health care provider about having an MRI and a mammogram every year.  Breast cancer gene (BRCA) assessment is recommended for women who have family members with BRCA-related cancers. BRCA-related cancers include: ? Breast. ? Ovarian. ? Tubal. ? Peritoneal cancers.  Results of the assessment will determine the need for genetic counseling and BRCA1 and BRCA2 testing.  Cervical Cancer Your health care provider may recommend that you be screened regularly for cancer of the pelvic organs (ovaries, uterus, and vagina). This screening involves a pelvic examination, including checking for microscopic changes to the surface of your cervix (Pap test). You may be encouraged to have this screening done every 3 years, beginning at age 29.  For women ages 56-65, health care providers may recommend pelvic exams and Pap testing every 3 years, or they may recommend the Pap and pelvic exam, combined with testing for human papilloma virus (HPV), every 5 years. Some types of HPV increase your risk of cervical cancer. Testing for HPV may also be done on women of any age with unclear Pap test results.  Other health care providers may not recommend any screening for nonpregnant women who are considered low risk for pelvic cancer and who do not have symptoms. Ask your health care provider if a screening pelvic exam is right for you.  If you have had past treatment for cervical cancer or a condition that could lead to cancer, you need Pap tests and screening for cancer for at least 20 years after your treatment. If Pap tests have been discontinued, your risk factors (such as having a new sexual partner) need to be reassessed to determine if screening should resume. Some women have medical problems that increase the chance  of getting cervical cancer. In these cases, your health care provider may recommend more frequent screening and Pap tests.  Colorectal Cancer  This type of cancer can be detected and often prevented.  Routine colorectal cancer screening usually begins at 67 years of age and continues through 67 years of age.  Your health care provider may recommend screening at an earlier age if you have risk factors for colon cancer.  Your health care provider may also recommend using home test kits to check for hidden blood in the stool.  A small camera at the end of a tube can be used to examine your colon directly (sigmoidoscopy or colonoscopy). This is done to check for the earliest forms of colorectal cancer.  Routine screening usually begins at age 79.  Direct examination of the colon should be repeated every 5-10 years  through 67 years of age. However, you may need to be screened more often if early forms of precancerous polyps or small growths are found.  Skin Cancer  Check your skin from head to toe regularly.  Tell your health care provider about any new moles or changes in moles, especially if there is a change in a mole's shape or color.  Also tell your health care provider if you have a mole that is larger than the size of a pencil eraser.  Always use sunscreen. Apply sunscreen liberally and repeatedly throughout the day.  Protect yourself by wearing long sleeves, pants, a wide-brimmed hat, and sunglasses whenever you are outside.  Heart disease, diabetes, and high blood pressure  High blood pressure causes heart disease and increases the risk of stroke. High blood pressure is more likely to develop in: ? People who have blood pressure in the high end of the normal range (130-139/85-89 mm Hg). ? People who are overweight or obese. ? People who are African American.  If you are 12-33 years of age, have your blood pressure checked every 3-5 years. If you are 96 years of age or older,  have your blood pressure checked every year. You should have your blood pressure measured twice-once when you are at a hospital or clinic, and once when you are not at a hospital or clinic. Record the average of the two measurements. To check your blood pressure when you are not at a hospital or clinic, you can use: ? An automated blood pressure machine at a pharmacy. ? A home blood pressure monitor.  If you are between 61 years and 8 years old, ask your health care provider if you should take aspirin to prevent strokes.  Have regular diabetes screenings. This involves taking a blood sample to check your fasting blood sugar level. ? If you are at a normal weight and have a low risk for diabetes, have this test once every three years after 67 years of age. ? If you are overweight and have a high risk for diabetes, consider being tested at a younger age or more often. Preventing infection Hepatitis B  If you have a higher risk for hepatitis B, you should be screened for this virus. You are considered at high risk for hepatitis B if: ? You were born in a country where hepatitis B is common. Ask your health care provider which countries are considered high risk. ? Your parents were born in a high-risk country, and you have not been immunized against hepatitis B (hepatitis B vaccine). ? You have HIV or AIDS. ? You use needles to inject street drugs. ? You live with someone who has hepatitis B. ? You have had sex with someone who has hepatitis B. ? You get hemodialysis treatment. ? You take certain medicines for conditions, including cancer, organ transplantation, and autoimmune conditions.  Hepatitis C  Blood testing is recommended for: ? Everyone born from [birth year] through 1965. ? Anyone with known risk factors for hepatitis C.  Sexually transmitted infections (STIs)  You should be screened for sexually transmitted infections (STIs) including gonorrhea and chlamydia if: ? You are sexually  active and are younger than 67 years of age. ? You are older than 67 years of age and your health care provider tells you that you are at risk for this type of infection. ? Your sexual activity has changed since you were last screened and you are at an increased risk for chlamydia or gonorrhea. Ask  your health care provider if you are at risk.  If you do not have HIV, but are at risk, it may be recommended that you take a prescription medicine daily to prevent HIV infection. This is called pre-exposure prophylaxis (PrEP). You are considered at risk if: ? You are sexually active and do not regularly use condoms or know the HIV status of your partner(s). ? You take drugs by injection. ? You are sexually active with a partner who has HIV.  Talk with your health care provider about whether you are at high risk of being infected with HIV. If you choose to begin PrEP, you should first be tested for HIV. You should then be tested every 3 months for as long as you are taking PrEP. Pregnancy  If you are premenopausal and you may become pregnant, ask your health care provider about preconception counseling.  If you may become pregnant, take 400 to 800 micrograms (mcg) of folic acid every day.  If you want to prevent pregnancy, talk to your health care provider about birth control (contraception). Osteoporosis and menopause  Osteoporosis is a disease in which the bones lose minerals and strength with aging. This can result in serious bone fractures. Your risk for osteoporosis can be identified using a bone density scan.  If you are 5 years of age or older, or if you are at risk for osteoporosis and fractures, ask your health care provider if you should be screened.  Ask your health care provider whether you should take a calcium or vitamin D supplement to lower your risk for osteoporosis.  Menopause may have certain physical symptoms and risks.  Hormone replacement therapy may reduce some of these  symptoms and risks. Talk to your health care provider about whether hormone replacement therapy is right for you. Follow these instructions at home:  Schedule regular health, dental, and eye exams.  Stay current with your immunizations.  Do not use any tobacco products including cigarettes, chewing tobacco, or electronic cigarettes.  If you are pregnant, do not drink alcohol.  If you are breastfeeding, limit how much and how often you drink alcohol.  Limit alcohol intake to no more than 1 drink per day for nonpregnant women. One drink equals 12 ounces of beer, 5 ounces of wine, or 1 ounces of hard liquor.  Do not use street drugs.  Do not share needles.  Ask your health care provider for help if you need support or information about quitting drugs.  Tell your health care provider if you often feel depressed.  Tell your health care provider if you have ever been abused or do not feel safe at home. This information is not intended to replace advice given to you by your health care provider. Make sure you discuss any questions you have with your health care provider. Document Released: 10/19/2010 Document Revised: 09/11/2015 Document Reviewed: 01/07/2015 Elsevier Interactive Patient Education  [employer].

## 2017-08-02 ENCOUNTER — Other Ambulatory Visit: Payer: Self-pay

## 2017-08-02 MED ORDER — ALBUTEROL SULFATE HFA 108 (90 BASE) MCG/ACT IN AERS: 2.0000 | INHALATION_SPRAY | Freq: Four times a day (QID) | RESPIRATORY_TRACT | 3 refills | Status: DC | PRN

## 2017-08-02 NOTE — Progress Notes (Signed)
Per dr. Maudie Mercury; re-ordered Albuterol as ordered prior. Let the patient know to pick it up.  Also told her to start Vit d3 1000 u q day Apt 5/23 with Dr. Maudie Mercury  Had mammogram and repeat dexa as well Wynetta Fines RN

## 2017-08-22 ENCOUNTER — Ambulatory Visit: Payer: Medicare Other | Admitting: Family Medicine

## 2017-08-29 ENCOUNTER — Ambulatory Visit
Admission: RE | Admit: 2017-08-29 | Discharge: 2017-08-29 | Disposition: A | Discharge status: Home / Self Care | Payer: Medicare Other | Source: Ambulatory Visit | Attending: Family Medicine | Admitting: Family Medicine

## 2017-08-29 DIAGNOSIS — Z1231 Encounter for screening mammogram for malignant neoplasm of breast: Secondary | ICD-10-CM

## 2017-08-29 DIAGNOSIS — Z780 Asymptomatic menopausal state: Secondary | ICD-10-CM | POA: Diagnosis not present

## 2017-08-29 DIAGNOSIS — E2839 Other primary ovarian failure: Secondary | ICD-10-CM

## 2017-08-29 DIAGNOSIS — M810 Age-related osteoporosis without current pathological fracture: Secondary | ICD-10-CM | POA: Diagnosis not present

## 2017-09-06 NOTE — Progress Notes (Signed)
Medicare Annual Preventive Care Visit  (initial annual wellness or annual wellness exam)  Concerns and/or follow up today: Due for labs, tx osteoporosis, pneumonia vaccine ppsv23  Needs her welcome to medicare visit with physician. Saw nurse recently and reviewed preventive care. PMH mild anemia, vit d def, mild int asthma, GERD, [name] lesion (sees GI), fracture of tibia in 2018 - saw ortho. Osteoporosis on recent bone density test.  See HM section in Epic for other details of completed HM. See scanned documentation under Media Tab for further documentation HPI, health risk assessment. See Media Tab and Care Teams sections in Epic for other providers.  ROS: negative for report of fevers, unintentional weight loss, vision changes, vision loss, hearing loss or change, chest pain, sob, hemoptysis, melena, hematochezia, hematuria, genital discharge or lesions, falls, bleeding or bruising, loc, thoughts of suicide or self harm, memory loss  1.) Patient-completed health risk assessment  - completed and reviewed, see scanned documentation  2.) Review of Medical History: -PMH, PSH, Family History and current specialty and care providers reviewed and updated and listed below  - see scanned in document in chart and below  Past Medical History:  Diagnosis Date  . Adenomatous colon polyp 09/04/2014  . Asthma    albuterol-rescue inhaler-uses 1-2 x year  . Bone disease, metabolic 7/82/9562  . [name] lesion, chronic    on sm bowel capsule 06/2014  . Complication of anesthesia    slow to awaken  . Fundic gland polyps of stomach, benign   . GERD (gastroesophageal reflux disease)    no meds  . Hiatal hernia   . 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 Vitamin D deficiency 08/26/2016    Past Surgical History:  Procedure Laterality Date  . ANTERIOR AND POSTERIOR REPAIR  02/22/2011   Procedure: ANTERIOR (CYSTOCELE) AND POSTERIOR REPAIR (RECTOCELE);   Surgeon: Delice Lesch, MD;  Location: [facility];  Service: Gynecology;  Laterality: N/A;  . COLONOSCOPY    . CYSTOSCOPY  02/22/2011   Procedure: CYSTOSCOPY;  Surgeon: Delice Lesch, MD;  Location: [facility];  Service: Gynecology;  Laterality: N/A;  . DILATION AND CURETTAGE OF UTERUS  1980  . EXTERNAL FIXATION LEG Left 07/24/2016   Procedure: EXTERNAL FIXATION LEFT LOWER LEG;  Surgeon: Rod Can, MD;  Location: [facility];  Service: Orthopedics;  Laterality: Left;  . ORIF TIBIA PLATEAU Left 07/27/2016   Procedure: OPEN REDUCTION INTERNAL FIXATION (ORIF) TIBIAL PLATEAU;  Surgeon: Altamese Joplin, MD;  Location: [facility];  Service: Orthopedics;  Laterality: Left;  . SALPINGOOPHORECTOMY  02/22/2011   Procedure: SALPINGO OOPHERECTOMY;  Surgeon: Delice Lesch, MD;  Location: [facility];  Service: Gynecology;  Laterality: Bilateral;  . VAGINAL HYSTERECTOMY  02/22/2011   Procedure: HYSTERECTOMY VAGINAL;  Surgeon: Delice Lesch, MD;  Location: [facility];  Service: Gynecology;  Laterality: N/A;  . WISDOM TOOTH EXTRACTION      Social History   Socioeconomic History  . Marital status: Married    Spouse name: Not on file  . Number of children: 4  . Years of education: Not on file  . Highest education level: Not on file  Occupational History  . Occupation: [employer]  Social Needs  . Financial resource strain: Not on file  . Food insecurity:    Worry: Not on file    Inability: Not on file  . Transportation needs:    Medical: Not on file    Non-medical: Not  on file  Tobacco Use  . Smoking status: Never Smoker  . Smokeless tobacco: Never Used  Substance and Sexual Activity  . Alcohol use: Yes    Alcohol/week: 0.0 oz    Comment: Rarely  . Drug use: No  . Sexual activity: Not on file  Lifestyle  . Physical activity:    Days per week: Not on file    Minutes per session: Not on file  . Stress: Not on file  Relationships  . Social connections:    Talks on phone: Not on file    Gets  together: Not on file    Attends religious service: Not on file    Active member of club or organization: Not on file    Attends meetings of clubs or organizations: Not on file    Relationship status: Not on file  . Intimate partner violence:    Fear of current or ex partner: Not on file    Emotionally abused: Not on file    Physically abused: Not on file    Forced sexual activity: Not on file  Other Topics Concern  . 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 Diabetes Father   . Lung cancer Sister 65  . Heart disease Maternal Grandmother   . Heart disease Maternal Grandfather   . Kidney disease Maternal Grandfather   . Colon cancer Neg Hx   . Colon polyps Neg Hx   . Esophageal cancer Neg Hx   . Gallbladder disease Neg Hx   . Breast cancer Neg Hx     Current Outpatient Medications on File Prior to Visit  Medication Sig Dispense Refill  . albuterol (PROVENTIL HFA;VENTOLIN HFA) 108 (90 Base) MCG/ACT inhaler Inhale 2 puffs into the lungs every 6 (six) hours as needed for wheezing or shortness of breath. 1 Inhaler 3  . Cholecalciferol (VITAMIN D PO) Take by mouth daily.    Marland Kitchen enoxaparin (LOVENOX) 40 MG/0.4ML injection Inject 0.4 mLs (40 mg total) into the skin daily. 21 Syringe 0  . ferrous sulfate 325 (65 FE) MG tablet Take 325 mg by mouth daily with breakfast.    . Multiple Vitamin (MULTIVITAMIN WITH MINERALS) TABS tablet Take 1 tablet by mouth daily.     No current facility-administered medications on file prior to visit.      3.) Review of functional ability and level of safety:  Any difficulty hearing?  See scanned documentation  History of falling?  See scanned documentation  Any trouble with IADLs - using a phone, using  transportation, grocery shopping, preparing meals, doing housework, doing laundry, taking medications and managing money?  See scanned documentation  Advance Directives?  Discussed briefly and offered more resources and detailed discussion with our trained staff.   See summary of recommendations in Patient Instructions below.  4.) Physical Exam There were no vitals filed for this visit. Estimated body mass index is 32.53 kg/m as calculated from the following:   Height as of 07/29/17: 5\' 1"  (1.549 m).   Weight as of 07/29/17: 172 lb 3 oz (78.1 kg).  EKG (optional): deferred  General: alert, appear well hydrated and in no acute distress  HEENT: visual acuity grossly  intact  CV: HRRR  Lungs: CTA bilaterally  Psych: pleasant and cooperative, no obvious depression or anxiety  Cognitive function grossly intact  See patient instructions for recommendations.  Education and counseling regarding the above review of health provided with a plan for the following: -see scanned patient completed form for further details -fall prevention strategies discussed  -healthy lifestyle discussed -importance and resources for completing advanced directives discussed -see patient instructions below for any other recommendations provided  4)The following written screening schedule of preventive measures were reviewed with assessment and plan made per below, orders and patient instructions:      AAA screening done if applicable     Alcohol screening done     Obesity Screening and counseling done     STI screening (Hep C if born 1945-65) offered and per pt wishes     Tobacco Screening done done       Pneumococcal (PPSV23 -one dose after 64, one before if risk factors), influenza yearly and hepatitis B vaccines (if high risk - end stage renal disease, IV drugs, homosexual men, live in home for mentally retarded, hemophilia receiving factors) ASSESSMENT/PLAN: done if applicable      Screening  mammograph (yearly if >40) ASSESSMENT/PLAN: utd, done 08/2017 birads 1      Screening Pap smear/pelvic exam (q2 years) ASSESSMENT/PLAN: n/a, declined      Prostate cancer screening ASSESSMENT/PLAN: n/a, declined      Colorectal cancer screening (FOBT yearly or flex sig q4y or colonoscopy q10y or barium enema q4y) ASSESSMENT/PLAN: sees gi with colonosocpy in 2016      Diabetes outpatient self-management training services ASSESSMENT/PLAN: utd or done      Bone mass measurements(covered q2y if indicated - estrogen def, osteoporosis, hyperparathyroid, vertebral abnormalities, osteoporosis or steroids) ASSESSMENT/PLAN: done 08/2017 with osteoporosis      Screening for glaucoma(q1y if high risk - diabetes, FH, AA and > 50 or hispanic and > 65) ASSESSMENT/PLAN: utd or advised      Medical nutritional therapy for individuals with diabetes or renal disease ASSESSMENT/PLAN: see orders      Cardiovascular screening blood tests (lipids q5y) ASSESSMENT/PLAN: see orders and labs      Diabetes screening tests ASSESSMENT/PLAN: see orders and labs   7.) Summary: -risk factors and conditions per above assessment were discussed and treatment, recommendations and referrals were offered per documentation above and orders and patient instructions.  No diagnosis found.  There are no Patient Instructions on file for this visit.  Lucretia Kern, DO

## 2017-09-08 ENCOUNTER — Encounter: Payer: Self-pay | Admitting: Family Medicine

## 2017-09-08 ENCOUNTER — Ambulatory Visit (INDEPENDENT_AMBULATORY_CARE_PROVIDER_SITE_OTHER): Payer: Medicare Other | Admitting: Family Medicine

## 2017-09-08 VITALS — BP 90/60 | HR 61 | Temp 97.7°F | Ht 61.75 in | Wt 170.3 lb

## 2017-09-08 DIAGNOSIS — E669 Obesity, unspecified: Secondary | ICD-10-CM | POA: Diagnosis not present

## 2017-09-08 DIAGNOSIS — M810 Age-related osteoporosis without current pathological fracture: Secondary | ICD-10-CM | POA: Diagnosis not present

## 2017-09-08 DIAGNOSIS — R739 Hyperglycemia, unspecified: Secondary | ICD-10-CM | POA: Diagnosis not present

## 2017-09-08 DIAGNOSIS — Z6831 Body mass index (BMI) 31.0-31.9, adult: Secondary | ICD-10-CM

## 2017-09-08 DIAGNOSIS — Z0000 Encounter for general adult medical examination without abnormal findings: Secondary | ICD-10-CM | POA: Diagnosis not present

## 2017-09-08 DIAGNOSIS — E559 Vitamin D deficiency, unspecified: Secondary | ICD-10-CM | POA: Diagnosis not present

## 2017-09-08 DIAGNOSIS — Z23 Encounter for immunization: Secondary | ICD-10-CM

## 2017-09-08 DIAGNOSIS — Z1331 Encounter for screening for depression: Secondary | ICD-10-CM

## 2017-09-08 LAB — LIPID PANEL
CHOL/HDL RATIO: 4
Cholesterol: 172 mg/dL (ref 0–200)
HDL: 39.6 mg/dL (ref >39.00)
LDL Cholesterol: 106 mg/dL — ABNORMAL HIGH (ref 0–99)
NONHDL: 131.92
Triglycerides: 128 mg/dL (ref 0.0–149.0)
VLDL: 25.6 mg/dL (ref 0.0–40.0)

## 2017-09-08 LAB — VITAMIN D 25 HYDROXY (VIT D DEFICIENCY, FRACTURES): VITD: 20.47 ng/mL — ABNORMAL LOW (ref 30.00–100.00)

## 2017-09-08 LAB — HEMOGLOBIN A1C: Hgb A1c MFr Bld: 5.6 % (ref 4.6–6.5)

## 2017-09-08 NOTE — Progress Notes (Signed)
Medicare Annual Preventive Care Visit  (initial annual wellness or annual wellness exam)  Concerns and/or follow up today: Due for labs, tx osteoporosis, pneumonia vaccine ppsv23  Needs her welcome to medicare visit with physician. Saw nurse recently and reviewed preventive care. PMH mild anemia, vit d def, mild int asthma, GERD, [name] lesion (sees GI), fracture of tibia in 2018 - saw ortho. Osteoporosis on recent bone density test. She reports she has done some research at home regarding this. Hx tibial fx and reports hx vit D deficiency. For about one month has been taking Vit D3 daily 2000 IU. She has some fatigue at times. No other fractures or soreness. Not a lot of wt bearing exercise, but plans to do more.  See HM section in Epic for other details of completed HM. See scanned documentation under Media Tab for further documentation HPI, health risk assessment. See Media Tab and Care Teams sections in Epic for other providers.  ROS: negative for report of fevers, unintentional weight loss, vision changes, vision loss, hearing loss or change, chest pain, sob, hemoptysis, melena, hematochezia, hematuria, genital discharge or lesions, falls, bleeding or bruising, loc, thoughts of suicide or self harm, memory loss  1.) Patient-completed health risk assessment  - completed and reviewed, see scanned documentation  2.) Review of Medical History: -PMH, PSH, Family History and current specialty and care providers reviewed and updated and listed below  - see scanned in document in chart and below  Past Medical History:  Diagnosis Date  . Adenomatous colon polyp 09/04/2014  . Asthma    albuterol-rescue inhaler-uses 1-2 x year  . Bone disease, metabolic 1/61/0960  . [name] lesion, chronic    on sm bowel capsule 06/2014  . Complication of anesthesia    slow to awaken  . Fundic gland polyps of stomach, benign   . GERD (gastroesophageal reflux disease)    no meds  . Hiatal hernia   . 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 Vitamin D deficiency 08/26/2016    Past Surgical History:  Procedure Laterality Date  . ANTERIOR AND POSTERIOR REPAIR  02/22/2011   Procedure: ANTERIOR (CYSTOCELE) AND POSTERIOR REPAIR (RECTOCELE);  Surgeon: Delice Lesch, MD;  Location: [facility];  Service: Gynecology;  Laterality: N/A;  . COLONOSCOPY    . CYSTOSCOPY  02/22/2011   Procedure: CYSTOSCOPY;  Surgeon: Delice Lesch, MD;  Location: [facility];  Service: Gynecology;  Laterality: N/A;  . DILATION AND CURETTAGE OF UTERUS  1980  . EXTERNAL FIXATION LEG Left 07/24/2016   Procedure: EXTERNAL FIXATION LEFT LOWER LEG;  Surgeon: Rod Can, MD;  Location: [facility];  Service: Orthopedics;  Laterality: Left;  . ORIF TIBIA PLATEAU Left 07/27/2016   Procedure: OPEN REDUCTION INTERNAL FIXATION (ORIF) TIBIAL PLATEAU;  Surgeon: Altamese Rosenberg, MD;  Location: [facility] [place];  Service: Orthopedics;  Laterality: Left;  . SALPINGOOPHORECTOMY  02/22/2011   Procedure: SALPINGO OOPHERECTOMY;  Surgeon: Delice Lesch, MD;  Location: [facility];  Service: Gynecology;  Laterality: Bilateral;  . VAGINAL HYSTERECTOMY  02/22/2011   Procedure: HYSTERECTOMY VAGINAL;  Surgeon: Delice Lesch, MD;  Location: [facility];  Service: Gynecology;  Laterality: N/A;  . WISDOM TOOTH EXTRACTION      Social History   Socioeconomic History  . Marital status: Married    Spouse name: Not on file  . Number of children: 4  . Years of education: Not on file  . Highest education level: Not on  file  Occupational History  . Occupation: [employer]  Social Needs  . Financial resource strain: Not on file  . Food insecurity:    Worry: Not on file    Inability: Not on file  . Transportation needs:    Medical: Not on file    Non-medical: Not on file  Tobacco Use  . Smoking status: Never Smoker  . Smokeless tobacco: Never Used  Substance and Sexual Activity  . Alcohol use: Yes    Alcohol/week: 0.0 oz     Comment: Rarely  . Drug use: No  . Sexual activity: Not on file  Lifestyle  . Physical activity:    Days per week: Not on file    Minutes per session: Not on file  . Stress: Not on file  Relationships  . Social connections:    Talks on phone: Not on file    Gets together: Not on file    Attends religious service: Not on file    Active member of club or organization: Not on file    Attends meetings of clubs or organizations: Not on file    Relationship status: Not on file  . Intimate partner violence:    Fear of current or ex partner: Not on file    Emotionally abused: Not on file    Physically abused: Not on file    Forced sexual activity: Not on file  Other Topics Concern  . 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 Diabetes Father   . Lung cancer Sister 25  . Heart disease Maternal Grandmother   . Heart disease Maternal Grandfather   . Kidney disease Maternal Grandfather   . Colon cancer Neg Hx   . Colon polyps Neg Hx   . Esophageal cancer Neg Hx   . Gallbladder disease Neg Hx   . Breast cancer Neg Hx     Current Outpatient Medications on File Prior to Visit  Medication Sig Dispense Refill  . albuterol (PROVENTIL HFA;VENTOLIN HFA) 108 (90 Base) MCG/ACT inhaler Inhale 2 puffs into the lungs every 6 (six) hours as needed for wheezing or shortness of breath. 1 Inhaler 3  . Cholecalciferol (VITAMIN D PO) Take by mouth daily.    Marland Kitchen enoxaparin (LOVENOX) 40 MG/0.4ML injection Inject 0.4 mLs (40 mg total) into the skin daily. 21 Syringe 0  . ferrous sulfate 325 (65 FE) MG tablet Take 325 mg by mouth daily with breakfast.    . Multiple Vitamin (MULTIVITAMIN WITH MINERALS) TABS tablet Take 1 tablet by mouth daily.     No current  facility-administered medications on file prior to visit.      3.) Review of functional ability and level of safety:  Any difficulty hearing?  See scanned documentation  History of falling?  See scanned documentation  Any trouble with IADLs - using a phone, using transportation, grocery shopping, preparing meals, doing housework, doing laundry, taking medications and managing money?  See scanned documentation  Advance Directives?  Discussed briefly and offered more resources and detailed discussion with our trained staff. She has, but plans to redo and declined help here for this.  See summary of recommendations in Patient Instructions below.  4.) Physical Exam Vitals:   09/08/17 0720  BP: 90/60  Pulse: 61  Temp: 97.7 F (36.5 C)   Estimated body mass index is 31.4 kg/m as calculated from the following:   Height as of this encounter: 5' 1.75" (1.568 m).   Weight as of this encounter: 170 lb 4.8 oz (77.2 kg).  EKG (optional): deferred  General: alert, appear well hydrated and in no acute distress  HEENT: visual acuity grossly intact  CV: HRRR  Lungs: CTA bilaterally  Psych: pleasant and cooperative, no obvious depression or anxiety  Cognitive function grossly intact  See patient instructions for recommendations.  Education and counseling regarding the above review of health provided with a plan for the following: -see scanned patient completed form for further details -fall prevention strategies discussed  -healthy lifestyle discussed -importance and resources for completing advanced directives discussed -see patient instructions below for any other recommendations provided  4)The following written screening schedule of preventive measures were reviewed with assessment and plan made per below, orders and patient instructions:      AAA screening done if applicable     Alcohol screening done     Obesity Screening and counseling done     STI screening (Hep C  if born 1945-65) offered and per pt wishes     Tobacco Screening done done       Pneumococcal (PPSV23 -one dose after 64, one before if risk factors), influenza yearly and hepatitis B vaccines (if high risk - end stage renal disease, IV drugs, homosexual men, live in home for mentally retarded, hemophilia receiving factors) ASSESSMENT/PLAN: due today, done      Screening mammograph (yearly if >40) ASSESSMENT/PLAN: utd, done 08/2017 birads 1      Screening Pap smear/pelvic exam (q2 years) ASSESSMENT/PLAN: n/a, declined      Prostate cancer screening ASSESSMENT/PLAN: n/a, declined      Colorectal cancer screening (FOBT yearly or flex sig q4y or colonoscopy q10y or barium enema q4y) ASSESSMENT/PLAN: sees gi with colonosocpy in 2016      Diabetes outpatient self-management training services ASSESSMENT/PLAN: utd or done      Bone mass measurements(covered q2y if indicated - estrogen def, osteoporosis, hyperparathyroid, vertebral abnormalities, osteoporosis or steroids) ASSESSMENT/PLAN: done 08/2017 with osteoporosis      Screening for glaucoma(q1y if high risk - diabetes, FH, AA and > 50 or hispanic and > 65) ASSESSMENT/PLAN: utd or advised      Medical nutritional therapy for individuals with diabetes or renal disease ASSESSMENT/PLAN: see orders      Cardiovascular screening blood tests (lipids q5y) ASSESSMENT/PLAN: see orders and labs      Diabetes screening tests ASSESSMENT/PLAN: see orders and labs   7.) Summary:   Medicare annual wellness visit, initial -risk factors and conditions per above assessment were discussed and treatment, recommendations and referrals were offered per documentation above and orders and patient instructions.  Osteoporosis, unspecified osteoporosis type, unspecified pathological fracture presence -lengthy discussion today about implications and treatment/risks -she wants to start with Vit D3, checking today; weight bearing exercise, adequate  dietary calcium and consider fosamax. She agrees to let us know if wants to start the fosamax. Advised recheck bone density in 1 year. But she wants to do 2 years as doe snot think insurance will pay.  Screening for depression - neg  Vitamin D deficiency - Plan: VITAMIN D 25 Hydroxy (Vit-D Deficiency, Fractures)  Class 1 obesity with body mass index (BMI) of 31.0 to 31.9 in adult, unspecified obesity  type, unspecified whether serious comorbidity present - Plan: Lipid panel -healthy diet and regular aerobic exercise advised  Hyperglycemia - Plan: Hemoglobin A1c  Need for prophylactic vaccination against Streptococcus pneumoniae (pneumococcus) - Plan: Pneumococcal polysaccharide vaccine 23-valent greater than or equal to 2yo subcutaneous/IM  Patient Instructions   BEFORE YOU LEAVE: -PPSV 23 -labs -follow up: 6 months  Vit D3 1000 IU daily  Healthy diet and regular weight bearing exercise  Consider medication for osteoporosis  Update your advanced directives and bring [place] a copy  We have ordered labs or studies at this visit. It can take up to 1-2 weeks for results and processing. IF results require follow up or explanation, we will call you with instructions. Clinically stable results will be released to your [facility]. If you have not heard from [place] or cannot find your results in [facility] in 2 weeks please contact our office at [phone].  If you are not yet signed up for [facility], please consider signing up.    Preventive Care 12 Years and Older, Female Preventive care refers to lifestyle choices and visits with your health care provider that can promote health and wellness. What does preventive care include?  A yearly physical exam. This is also called an annual well check.  Dental exams once or twice a year.  Routine eye exams. Ask your health care provider how often you should have your eyes checked.  Personal lifestyle choices, including: ? Daily care of your teeth and  gums. ? Regular physical activity. ? Eating a healthy diet. ? Avoiding tobacco and drug use. ? Limiting alcohol use. ? Practicing safe sex. ? Taking low-dose aspirin every day. ? Taking vitamin and mineral supplements as recommended by your health care provider. What happens during an annual well check? The services and screenings done by your health care provider during your annual well check will depend on your age, overall health, lifestyle risk factors, and family history of disease. Counseling Your health care provider may ask you questions about your:  Alcohol use.  Tobacco use.  Drug use.  Emotional well-being.  Home and relationship well-being.  Sexual activity.  Eating habits.  History of falls.  Memory and ability to understand (cognition).  Work and work Statistician.  Reproductive health.  Screening You may have the following tests or measurements:  Height, weight, and BMI.  Blood pressure.  Lipid and cholesterol levels. These may be checked every 5 years, or more frequently if you are over 74 years old.  Skin check.  Lung cancer screening. You may have this screening every year starting at age 83 if you have a 30-pack-year history of smoking and currently smoke or have quit within the past 15 years.  Fecal occult blood test (FOBT) of the stool. You may have this test every year starting at age 3.  Flexible sigmoidoscopy or colonoscopy. You may have a sigmoidoscopy every 5 years or a colonoscopy every 10 years starting at age 78.  Hepatitis C blood test.  Hepatitis B blood test.  Sexually transmitted disease (STD) testing.  Diabetes screening. This is done by checking your blood sugar (glucose) after you have not eaten for a while (fasting). You may have this done every 1-3 years.  Bone density scan. This is done to screen for osteoporosis. You may have this done starting at age 61.  Mammogram. This may be done every 1-2 years. Talk to your  health care provider about how often you should have regular mammograms.  Talk with your  health care provider about your test results, treatment options, and if necessary, the need for more tests. Vaccines Your health care provider may recommend certain vaccines, such as:  Influenza vaccine. This is recommended every year.  Tetanus, diphtheria, and acellular pertussis (Tdap, Td) vaccine. You may need a Td booster every 10 years.  Varicella vaccine. You may need this if you have not been vaccinated.  Zoster vaccine. You may need this after age 108.  Measles, mumps, and rubella (MMR) vaccine. You may need at least one dose of MMR if you were born in 1957 or later. You may also need a second dose.  Pneumococcal 13-valent conjugate (PCV13) vaccine. One dose is recommended after age 63.  Pneumococcal polysaccharide (PPSV23) vaccine. One dose is recommended after age 36.  Meningococcal vaccine. You may need this if you have certain conditions.  Hepatitis A vaccine. You may need this if you have certain conditions or if you travel or work in places where you may be exposed to hepatitis A.  Hepatitis B vaccine. You may need this if you have certain conditions or if you travel or work in places where you may be exposed to hepatitis B.  Haemophilus influenzae type b (Hib) vaccine. You may need this if you have certain conditions.  Talk to your health care provider about which screenings and vaccines you need and how often you need them. This information is not intended to replace advice given to you by your health care provider. Make sure you discuss any questions you have with your health care provider. Document Released: 05/02/2015 Document Revised: 12/24/2015 Document Reviewed: 02/04/2015 Elsevier Interactive Patient Education  2018 [place]. Jerrett , Thank you for taking time to come for your Medicare Wellness Visit. I appreciate your ongoing commitment to your health goals.  Please review the following plan we discussed and let me know if I can assist you in the future.   These are the goals we discussed: Goals      Weight   . Weight (lb) < 160 lb (72.6 kg)     Plan is: Plan you 1/2 diet  Will try to cut back on portions  Check out  online nutrition programs as GumSearch.nl and http://vang.com/; fit80m; Look for foods with "whole" wheat; bran; oatmeal etc Shot at the farmer's markets in season for fresher choices  Watch for "hydrogenated" on the label of oils which are trans-fats.  Watch for "high fructose corn syrup" in snacks, yogurt or ketchup  Meats have less marbling; bright colored fruits and vegetables;  Canned; dump out liquid and wash vegetables. Be mindful of what we are eating  Portion control is essential to a health weight! Sit down; take a break and enjoy your meal; take smaller bites; put the fork down between bites;  It takes 20 minutes to get full; so check in with your fullness cues and stop eating when you start to fill full              This is a list of the screening recommended for you and due dates:  Health Maintenance  Topic Date Due  . Pneumonia vaccines (2 of 2 - PPSV23) today  . Flu Shot  11/17/2017  . Colon Cancer Screening  06/14/2019  . Mammogram  08/30/2019  . Tetanus Vaccine  05/05/2023  . DEXA scan (bone density measurement)  Completed  .  Hepatitis C: One time screening is recommended by Center for Disease Control  (CDC) for  adults born from [birth year] through 1965.   Completed          Lucretia Kern, DO

## 2017-09-08 NOTE — Patient Instructions (Addendum)
BEFORE YOU LEAVE: -PPSV 23 -labs -follow up: 6 months  Vit D3 1000 IU daily  Healthy diet and regular weight bearing exercise  Consider medication for osteoporosis  Update your advanced directives and bring [place] a copy  We have ordered labs or studies at this visit. It can take up to 1-2 weeks for results and processing. IF results require follow up or explanation, we will call you with instructions. Clinically stable results will be released to your [facility]. If you have not heard from [place] or cannot find your results in [facility] in 2 weeks please contact our office at [phone].  If you are not yet signed up for [facility], please consider signing up.    Preventive Care 60 Years and Older, Female Preventive care refers to lifestyle choices and visits with your health care provider that can promote health and wellness. What does preventive care include?  A yearly physical exam. This is also called an annual well check.  Dental exams once or twice a year.  Routine eye exams. Ask your health care provider how often you should have your eyes checked.  Personal lifestyle choices, including: ? Daily care of your teeth and gums. ? Regular physical activity. ? Eating a healthy diet. ? Avoiding tobacco and drug use. ? Limiting alcohol use. ? Practicing safe sex. ? Taking low-dose aspirin every day. ? Taking vitamin and mineral supplements as recommended by your health care provider. What happens during an annual well check? The services and screenings done by your health care provider during your annual well check will depend on your age, overall health, lifestyle risk factors, and family history of disease. Counseling Your health care provider may ask you questions about your:  Alcohol use.  Tobacco use.  Drug use.  Emotional well-being.  Home and relationship well-being.  Sexual activity.  Eating habits.  History of falls.  Memory and ability to understand  (cognition).  Work and work Statistician.  Reproductive health.  Screening You may have the following tests or measurements:  Height, weight, and BMI.  Blood pressure.  Lipid and cholesterol levels. These may be checked every 5 years, or more frequently if you are over 2 years old.  Skin check.  Lung cancer screening. You may have this screening every year starting at age 66 if you have a 30-pack-year history of smoking and currently smoke or have quit within the past 15 years.  Fecal occult blood test (FOBT) of the stool. You may have this test every year starting at age 71.  Flexible sigmoidoscopy or colonoscopy. You may have a sigmoidoscopy every 5 years or a colonoscopy every 10 years starting at age 89.  Hepatitis C blood test.  Hepatitis B blood test.  Sexually transmitted disease (STD) testing.  Diabetes screening. This is done by checking your blood sugar (glucose) after you have not eaten for a while (fasting). You may have this done every 1-3 years.  Bone density scan. This is done to screen for osteoporosis. You may have this done starting at age 42.  Mammogram. This may be done every 1-2 years. Talk to your health care provider about how often you should have regular mammograms.  Talk with your health care provider about your test results, treatment options, and if necessary, the need for more tests. Vaccines Your health care provider may recommend certain vaccines, such as:  Influenza vaccine. This is recommended every year.  Tetanus, diphtheria, and acellular pertussis (Tdap, Td) vaccine. You may need a Td booster  every 10 years.  Varicella vaccine. You may need this if you have not been vaccinated.  Zoster vaccine. You may need this after age 62.  Measles, mumps, and rubella (MMR) vaccine. You may need at least one dose of MMR if you were born in 1957 or later. You may also need a second dose.  Pneumococcal 13-valent conjugate (PCV13) vaccine. One dose  is recommended after age 10.  Pneumococcal polysaccharide (PPSV23) vaccine. One dose is recommended after age 30.  Meningococcal vaccine. You may need this if you have certain conditions.  Hepatitis A vaccine. You may need this if you have certain conditions or if you travel or work in places where you may be exposed to hepatitis A.  Hepatitis B vaccine. You may need this if you have certain conditions or if you travel or work in places where you may be exposed to hepatitis B.  Haemophilus influenzae type b (Hib) vaccine. You may need this if you have certain conditions.  Talk to your health care provider about which screenings and vaccines you need and how often you need them. This information is not intended to replace advice given to you by your health care provider. Make sure you discuss any questions you have with your health care provider. Document Released: 05/02/2015 Document Revised: 12/24/2015 Document Reviewed: 02/04/2015 Elsevier Interactive Patient Education  2018 [place]. Re[place] , Thank you for taking time to come for your Medicare Wellness Visit. I appreciate your ongoing commitment to your health goals. Please review the following plan we discussed and let me know if I can assist you in the future.   These are the goals we discussed: Goals      Weight   . Weight (lb) < 160 lb (72.6 kg)     Plan is: Plan you 1/2 diet  Will try to cut back on portions  Check out  online nutrition programs as GumSearch.nl and http://vang.com/; fit26m; Look for foods with "whole" wheat; bran; oatmeal etc Shot at the farmer's markets in season for fresher choices  Watch for "hydrogenated" on the label of oils which are trans-fats.  Watch for "high fructose corn syrup" in snacks, yogurt or ketchup  Meats have less marbling; bright colored fruits and vegetables;  Canned; dump out liquid and wash vegetables. Be mindful of what we are eating  Portion control is essential  to a health weight! Sit down; take a break and enjoy your meal; take smaller bites; put the fork down between bites;  It takes 20 minutes to get full; so check in with your fullness cues and stop eating when you start to fill full              This is a list of the screening recommended for you and due dates:  Health Maintenance  Topic Date Due  . Pneumonia vaccines (2 of 2 - PPSV23) today  . Flu Shot  11/17/2017  . Colon Cancer Screening  06/14/2019  . Mammogram  08/30/2019  . Tetanus Vaccine  05/05/2023  . DEXA scan (bone density measurement)  Completed  .  Hepatitis C: One time screening is recommended by Center for Disease Control  (CDC) for  adults born from [birth year]through 1965.   Completed

## 2017-09-15 ENCOUNTER — Telehealth: Payer: Self-pay | Admitting: Family Medicine

## 2017-09-15 NOTE — Telephone Encounter (Signed)
Result note read to patient; verbalizes understanding.   Please note patient states she has been taking Vit. D 1000 mcg regularly  x 6 weeks.

## 2017-09-15 NOTE — Telephone Encounter (Signed)
See results note. 

## 2017-09-17 DIAGNOSIS — Z87442 Personal history of urinary calculi: Secondary | ICD-10-CM

## 2017-09-17 HISTORY — DX: Personal history of urinary calculi: Z87.442

## 2017-09-28 ENCOUNTER — Emergency Department (HOSPITAL_COMMUNITY)
Admission: EM | Admit: 2017-09-28 | Discharge: 2017-09-28 | Disposition: A | Discharge status: Home / Self Care | Payer: Medicare Other | Attending: Emergency Medicine | Admitting: Emergency Medicine

## 2017-09-28 ENCOUNTER — Encounter (HOSPITAL_COMMUNITY): Payer: Self-pay | Admitting: Emergency Medicine

## 2017-09-28 ENCOUNTER — Emergency Department (HOSPITAL_COMMUNITY): Payer: Medicare Other

## 2017-09-28 ENCOUNTER — Other Ambulatory Visit: Payer: Self-pay

## 2017-09-28 DIAGNOSIS — J45909 Unspecified asthma, uncomplicated: Secondary | ICD-10-CM | POA: Diagnosis not present

## 2017-09-28 DIAGNOSIS — R1111 Vomiting without nausea: Secondary | ICD-10-CM | POA: Diagnosis not present

## 2017-09-28 DIAGNOSIS — Z79899 Other long term (current) drug therapy: Secondary | ICD-10-CM | POA: Insufficient documentation

## 2017-09-28 DIAGNOSIS — K449 Diaphragmatic hernia without obstruction or gangrene: Secondary | ICD-10-CM | POA: Diagnosis not present

## 2017-09-28 DIAGNOSIS — R1011 Right upper quadrant pain: Secondary | ICD-10-CM | POA: Diagnosis not present

## 2017-09-28 DIAGNOSIS — R112 Nausea with vomiting, unspecified: Secondary | ICD-10-CM | POA: Diagnosis not present

## 2017-09-28 DIAGNOSIS — R1013 Epigastric pain: Secondary | ICD-10-CM | POA: Diagnosis not present

## 2017-09-28 DIAGNOSIS — N1330 Unspecified hydronephrosis: Secondary | ICD-10-CM | POA: Diagnosis not present

## 2017-09-28 LAB — CBC
HEMATOCRIT: 46 % (ref 36.0–46.0)
Hemoglobin: 15.1 g/dL — ABNORMAL HIGH (ref 12.0–15.0)
MCH: 30 pg (ref 26.0–34.0)
MCHC: 32.8 g/dL (ref 30.0–36.0)
MCV: 91.5 fL (ref 78.0–100.0)
PLATELETS: 315 10*3/uL (ref 150–400)
RBC: 5.03 MIL/uL (ref 3.87–5.11)
RDW: 13 % (ref 11.5–15.5)
WBC: 16.3 10*3/uL — ABNORMAL HIGH (ref 4.0–10.5)

## 2017-09-28 LAB — URINALYSIS, ROUTINE W REFLEX MICROSCOPIC
BACTERIA UA: NONE SEEN
Glucose, UA: NEGATIVE mg/dL
KETONES UR: NEGATIVE mg/dL
Nitrite: NEGATIVE
PH: 6 (ref 5.0–8.0)
PROTEIN: 100 mg/dL — AB
Specific Gravity, Urine: 1.029 (ref 1.005–1.030)

## 2017-09-28 LAB — COMPREHENSIVE METABOLIC PANEL
ALT: 20 U/L (ref 14–54)
AST: 21 U/L (ref 15–41)
Albumin: 3.9 g/dL (ref 3.5–5.0)
Alkaline Phosphatase: 92 U/L (ref 38–126)
Anion gap: 11 (ref 5–15)
BUN: 15 mg/dL (ref 6–20)
CHLORIDE: 106 mmol/L (ref 101–111)
CO2: 26 mmol/L (ref 22–32)
CREATININE: 0.82 mg/dL (ref 0.44–1.00)
Calcium: 10.6 mg/dL — ABNORMAL HIGH (ref 8.9–10.3)
GFR calc non Af Amer: >60 mL/min (ref >60)
Glucose, Bld: 172 mg/dL — ABNORMAL HIGH (ref 65–99)
POTASSIUM: 3.8 mmol/L (ref 3.5–5.1)
SODIUM: 143 mmol/L (ref 135–145)
Total Bilirubin: 0.7 mg/dL (ref 0.3–1.2)
Total Protein: 7.9 g/dL (ref 6.5–8.1)

## 2017-09-28 LAB — LIPASE, BLOOD: LIPASE: 26 U/L (ref 11–51)

## 2017-09-28 MED ORDER — ONDANSETRON 4 MG PO TBDP: 4.0000 mg | ORAL_TABLET | Freq: Three times a day (TID) | ORAL | 0 refills | Status: DC | PRN

## 2017-09-28 MED ORDER — ONDANSETRON 4 MG PO TBDP
4.0000 mg | ORAL_TABLET | Freq: Once | ORAL | Status: AC
  Administered 2017-09-28: 4 mg via ORAL
  Filled 2017-09-28: qty 1

## 2017-09-28 MED ORDER — IOHEXOL 300 MG/ML  SOLN
100.0000 mL | Freq: Once | INTRAMUSCULAR | Status: AC
  Administered 2017-09-28: 75 mL via INTRAVENOUS

## 2017-09-28 MED ORDER — SODIUM CHLORIDE 0.9 % IV BOLUS
1000.0000 mL | Freq: Once | INTRAVENOUS | Status: AC
  Administered 2017-09-28: 1000 mL via INTRAVENOUS

## 2017-09-28 NOTE — ED Notes (Signed)
Dr. James at bedside  

## 2017-09-28 NOTE — ED Notes (Signed)
Patient transported to Ultrasound 

## 2017-09-28 NOTE — ED Notes (Signed)
Patient returned from US.

## 2017-09-28 NOTE — ED Triage Notes (Signed)
Pt. Stated, I started having N/V with stomach pain yesterday. Now theres nothing but bile coming up.

## 2017-09-28 NOTE — Discharge Instructions (Addendum)
You may have passed a kidney stone based on your urine, CT, and ultrasound results. Your CT scan suggests that the mass in your liver is from a benign hemangioma.  However, it is recommended that you contact your primary care to order outpatient MRI to confirm this finding

## 2017-09-28 NOTE — ED Notes (Signed)
Pt verbalized understanding of d/c instructions and has no further questions, VSS, nAD. Pt removed all belongings.

## 2017-09-28 NOTE — ED Provider Notes (Addendum)
[facility] EMERGENCY DEPARTMENT Provider Note   CSN: [id number] Arrival date & time: 09/28/17  0707     History   Chief Complaint Chief Complaint  Patient presents with  . Abdominal Pain  . Emesis  . Nausea    HPI Angel Irwin is a 67 y.o. female.  Complaint is abdominal pain.  HPI: 67 year old female.  Developed abdominal pain yesterday afternoon followed by nausea and vomiting.  Pain was epigastric.  Was given some Zofran in triage today her nausea and pain have resolved.  She had a similar episode back in December that resolved within a few hours.  She assumed it may have been a gallbladder attack and then.  She states that she ate a fish sandwich and a milkshake last night before her symptoms started.  History of gallstones.  No history of kidney stones.  Heme-negative nonbilious emesis.  No dysuria frequency hematuria flank pain.  Past Medical History:  Diagnosis Date  . Adenomatous colon polyp 09/04/2014  . Asthma    albuterol-rescue inhaler-uses 1-2 x year  . Bone disease, metabolic 1/70/0174  . [name] lesion, chronic    on sm bowel capsule 06/2014  . Complication of anesthesia    slow to awaken  . Fundic gland polyps of stomach, benign   . GERD (gastroesophageal reflux disease)    no meds  . Hiatal hernia   . 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 Vitamin D deficiency 08/26/2016    Patient Active Problem List   Diagnosis Date Noted  . Vitamin D deficiency 08/26/2016  . Bone disease, metabolic 94/49/6759  . Fall 08/26/2016  . Closed fracture of lateral portion of left tibial plateau 07/23/2016  . Hiatal hernia 09/04/2014  . 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 Iron deficiency anemia due to chronic blood loss 07/23/2014  . Asthma, chronic 04/08/2014    Past Surgical History:  Procedure Laterality Date  . ANTERIOR AND POSTERIOR REPAIR  02/22/2011   Procedure: ANTERIOR (CYSTOCELE) AND  POSTERIOR REPAIR (RECTOCELE);  Surgeon: Delice Lesch, MD;  Location: [facility];  Service: Gynecology;  Laterality: N/A;  . COLONOSCOPY    . CYSTOSCOPY  02/22/2011   Procedure: CYSTOSCOPY;  Surgeon: Delice Lesch, MD;  Location: [facility];  Service: Gynecology;  Laterality: N/A;  . DILATION AND CURETTAGE OF UTERUS  1980  . EXTERNAL FIXATION LEG Left 07/24/2016   Procedure: EXTERNAL FIXATION LEFT LOWER LEG;  Surgeon: Rod Can, MD;  Location: [facility];  Service: Orthopedics;  Laterality: Left;  . ORIF TIBIA PLATEAU Left 07/27/2016   Procedure: OPEN REDUCTION INTERNAL FIXATION (ORIF) TIBIAL PLATEAU;  Surgeon: Altamese , MD;  Location: [facility];  Service: Orthopedics;  Laterality: Left;  . SALPINGOOPHORECTOMY  02/22/2011   Procedure: SALPINGO OOPHERECTOMY;  Surgeon: Delice Lesch, MD;  Location: [facility];  Service: Gynecology;  Laterality: Bilateral;  . VAGINAL HYSTERECTOMY  02/22/2011   Procedure: HYSTERECTOMY VAGINAL;  Surgeon: Delice Lesch, MD;  Location: [facility];  Service: Gynecology;  Laterality: N/A;  . WISDOM TOOTH EXTRACTION       OB History   None      Home Medications    Prior to Admission medications   Medication Sig Start Date End Date Taking? Authorizing Provider  albuterol (PROVENTIL HFA;VENTOLIN HFA) 108 (90 Base) MCG/ACT inhaler Inhale 2 puffs into the lungs every 6 (six) hours as needed for wheezing or shortness of breath. 08/02/17  Yes Lucretia Kern,  DO  Cholecalciferol (VITAMIN D) 2000 units CAPS Take 2,000 Units by mouth daily.    Yes [provider]  cyanocobalamin ([employer] VITAMIN B12) 1000 MCG tablet Take 1,000 mcg by mouth daily.   Yes [provider]  ferrous sulfate 325 (65 FE) MG tablet Take 325 mg by mouth daily with breakfast.   Yes [provider]  Multiple Vitamin (MULTIVITAMIN WITH MINERALS) TABS tablet Take 1 tablet by mouth daily.   Yes [provider]  enoxaparin (LOVENOX) 40 MG/0.4ML injection Inject 0.4 mLs (40 mg total)  into the skin daily. Patient not taking: Reported on 09/28/2017 07/30/16   Ainsley Spinner, PA-C    Family History Family History  Problem Relation Age of Onset  . Diabetes Father   . Lung cancer Sister 12  . Heart disease Maternal Grandmother   . Heart disease Maternal Grandfather   . Kidney disease Maternal Grandfather   . Colon cancer Neg Hx   . Colon polyps Neg Hx   . Esophageal cancer Neg Hx   . Gallbladder disease Neg Hx   . Breast cancer Neg Hx     Social History Social History   Tobacco Use  . Smoking status: Never Smoker  . Smokeless tobacco: Never Used  Substance Use Topics  . Alcohol use: Yes    Alcohol/week: 0.0 oz    Comment: Rarely  . Drug use: No     Allergies   No known allergies   Review of Systems Review of Systems  Constitutional: Negative for appetite change, chills, diaphoresis, fatigue and fever.  HENT: Negative for mouth sores, sore throat and trouble swallowing.   Eyes: Negative for visual disturbance.  Respiratory: Negative for cough, chest tightness, shortness of breath and wheezing.   Cardiovascular: Negative for chest pain.  Gastrointestinal: Positive for abdominal pain and nausea. Negative for abdominal distention, diarrhea and vomiting.  Endocrine: Negative for polydipsia, polyphagia and polyuria.  Genitourinary: Negative for dysuria, frequency and hematuria.  Musculoskeletal: Negative for gait problem.  Skin: Negative for color change, pallor and rash.  Neurological: Negative for dizziness, syncope, light-headedness and headaches.  Hematological: Does not bruise/bleed easily.  Psychiatric/Behavioral: Negative for behavioral problems and confusion.     Physical Exam Updated Vital Signs BP (!) 89/56   Pulse 76   Temp 98.4 F (36.9 C) (Oral)   Resp 14   Ht 5\' 2"  (1.575 m)   Wt 77.1 kg (170 lb)   SpO2 92%   BMI 31.09 kg/m   Physical Exam  Constitutional: She is oriented to person, place, and time. She appears well-developed and  well-nourished. No distress.  HENT:  Head: Normocephalic.  Eyes: Pupils are equal, round, and reactive to light. Conjunctivae are normal. No scleral icterus.  Neck: Normal range of motion. Neck supple. No thyromegaly present.  Cardiovascular: Normal rate and regular rhythm. Exam reveals no gallop and no friction rub.  No murmur heard. Pulmonary/Chest: Effort normal and breath sounds normal. No respiratory distress. She has no wheezes. She has no rales.  Abdominal: Soft. Bowel sounds are normal. She exhibits no distension. There is no tenderness. There is no rebound.  No reproducible tenderness to examine  Musculoskeletal: Normal range of motion.  Neurological: She is alert and oriented to person, place, and time.  Skin: Skin is warm and dry. No rash noted.  Psychiatric: She has a normal mood and affect. Her behavior is normal.     ED Treatments / Results  Labs (all labs ordered are listed, but only  abnormal results are displayed) Labs Reviewed  COMPREHENSIVE METABOLIC PANEL - Abnormal; Notable for the following components:      Result Value   Glucose, Bld 172 (*)    Calcium 10.6 (*)    All other components within normal limits  CBC - Abnormal; Notable for the following components:   WBC 16.3 (*)    Hemoglobin 15.1 (*)    All other components within normal limits  URINALYSIS, ROUTINE W REFLEX MICROSCOPIC - Abnormal; Notable for the following components:   APPearance HAZY (*)    Hgb urine dipstick SMALL (*)    Bilirubin Urine SMALL (*)    Protein, ur 100 (*)    Leukocytes, UA TRACE (*)    All other components within normal limits  LIPASE, BLOOD    EKG None  Radiology Ct Abdomen Pelvis W Contrast  Result Date: 09/28/2017 CLINICAL DATA:  Epigastric pain, nausea and vomiting beginning last night. History of hysterectomy. EXAM: CT ABDOMEN AND PELVIS WITH CONTRAST TECHNIQUE: Multidetector CT imaging of the abdomen and pelvis was performed using the standard protocol following  bolus administration of intravenous contrast. CONTRAST:  75 cc Omnipaque 300 OMNIPAQUE IOHEXOL 300 MG/ML  SOLN COMPARISON:  Abdominal ultrasound September 28, 2017. FINDINGS: LOWER CHEST: Bibasilar atelectasis, compressive from hiatal hernia. Included heart size is normal. No pericardial effusion. HEPATOBILIARY: Irregular lobulated 4.6 x 6.1 x 7.6 cm (cc by transverse by AP) mass in LEFT lobe of the liver, this is predominantly hypodense with nodular margins on early phase, incompletely imaged on delayed phase. PANCREAS: Normal. SPLEEN: Normal. ADRENALS/URINARY TRACT: Kidneys are orthotopic, demonstrating symmetric enhancement. No nephrolithiasis, hydronephrosis or solid renal masses. Mild pelviectasis and bilateral parapelvic cysts. 2 cm RIGHT upper pole homogeneously hypodense cyst. The unopacified ureters are normal in course and caliber. Delayed imaging through the kidneys demonstrates symmetric prompt contrast excretion within the proximal urinary collecting system. Urinary bladder is partially distended and unremarkable. Normal adrenal glands. STOMACH/BOWEL: Large hiatal hernia. The stomach, small and large bowel are normal in course and caliber without inflammatory changes. Moderate colonic diverticulosis. Normal appendix. VASCULAR/LYMPHATIC: Aortoiliac vessels are normal in course and caliber. Mild calcific atherosclerosis. No lymphadenopathy by CT size criteria. REPRODUCTIVE: Status post hysterectomy. OTHER: No intraperitoneal free fluid or free air. MUSCULOSKELETAL: Nonacute. Moderate L4-5 disc height loss with vacuum disc and endplate spurring compatible with degenerative disc. IMPRESSION: 1. Irregular 4.6 x 6.1 x 7.6 cm mass LEFT lobe of the liver incompletely evaluated by routine CT. Imaging characteristics suggest hemangioma though are not definitive. Recommend MRI liver protocol with contrast. This recommendation follows ACR consensus guidelines: Management of Incidental Liver Lesions on CT: A White Paper  of the ACR Incidental Findings Committee. J Am Coll Radiol 2017; 16:1096-0454. 2. Large hiatal hernia. 3. Colonic diverticulosis without diverticulitis nor acute intra-abdominal/pelvic process. Aortic Atherosclerosis (ICD10-I70.0). Electronically Signed   By: Elon Alas M.D.   On: 09/28/2017 15:10   US Abdomen Limited Ruq  Result Date: 09/28/2017 CLINICAL DATA:  Right upper quadrant pain. EXAM: ULTRASOUND ABDOMEN LIMITED RIGHT UPPER QUADRANT COMPARISON:  None. FINDINGS: Gallbladder: No gallstones or wall thickening visualized. No sonographic Murphy sign noted by sonographer. Common bile duct: Diameter: 2 mm, normal. Liver: Large, echogenic 5.0 x 6.4 x 4.9 cm mass in the right hepatic lobe. Additional 9 mm area of calcification within the right hepatic lobe. Within normal limits in parenchymal echogenicity. Portal vein is patent on color Doppler imaging with normal direction of blood flow towards the liver. Incidental note is made of mild  right hydronephrosis and a simple appearing 1.5 cm cyst arising from the upper pole of the right kidney. IMPRESSION: 1. Large, echogenic 6.4 cm mass in the right hepatic lobe. Recommend CT abdomen and pelvis with contrast for further evaluation. 2. Mild right hydronephrosis. Electronically Signed   By: Titus Dubin M.D.   On: 09/28/2017 12:30    Procedures Procedures (including critical care time)  Medications Ordered in ED Medications  ondansetron (ZOFRAN-ODT) disintegrating tablet 4 mg (4 mg Oral Given 09/28/17 0744)  sodium chloride 0.9 % bolus 1,000 mL (0 mLs Intravenous Stopped 09/28/17 1301)  iohexol (OMNIPAQUE) 300 MG/ML solution 100 mL (75 mLs Intravenous Contrast Given 09/28/17 1454)     Initial Impression / Assessment and Plan / ED Course  I have reviewed the triage vital signs and the nursing notes.  Pertinent labs & imaging results that were available during my care of the patient were reviewed by me and considered in my medical decision  making (see chart for details).    Had blood pressure of 88.  She states she typically runs low with her pulse and pressure.  Was given fluids and ultimately this improved to 106.  She has microscopic hematuria with 5 white blood cells and 5 red blood cells.  Ultrasound shows possible hydronephrosis normal gallbladder.  Liver mass.  CT scan shows probable hemangioma but MRI recommended.  On her follow-up CT her right kidney does not show hydronephrosis.  She may have and perhaps likely passed ureteral stone as her kidney was originally dilated on ultrasound but normal on CT.  She remains symptom-free.  I have written for her on her discharge instructions that she is to call her primary care physician to request MRI as outpatient with contrast to confirm no findings of her liver hemangioma that would suggest alternate diagnosis.   EKG: SR, PVCs, bigeminy  Final Clinical Impressions(s) / ED Diagnoses   Final diagnoses:  RUQ pain  Vomiting without nausea, intractability of vomiting not specified, unspecified vomiting type  Epigastric pain    ED Discharge Orders    None       Tanna Furry, MD 09/28/17 1525    Tanna Furry, MD 10/24/17 1309

## 2017-09-28 NOTE — ED Notes (Signed)
Pt taken to ct 

## 2017-09-28 NOTE — ED Notes (Signed)
Pt ambulated to room from waiting room. Pt states that she has thrown up "almost every hour since about 11 pm last night, it's bile now so I think it's my gallbladder. I don't have any pain right now and haven't thrown up since they gave me that nausea medication up front".

## 2018-03-08 ENCOUNTER — Encounter: Payer: Self-pay | Admitting: Family Medicine

## 2018-03-08 NOTE — Progress Notes (Signed)
HPI:  Using dictation device. Unfortunately this device frequently misinterprets words/phrases.  Angel Irwin is a pleasant 67 y.o. here for follow up. Chronic medical problems summarized below were reviewed for changes and stability and were updated as needed below. These issues and their treatment remain stable for the most part.  Continues to eat healthy and get regular exercise. New issue of some vertigo.  She has had vertigo in the past.  This is been going on for about 3 weeks with this flare.  Brief spells of vertigo triggered by positional changes.  Denies headaches, vision changes, weakness, numbness, speech changes or illness.  Denies CP, SOB, DOE, treatment intolerance or new symptoms. She went to the emergency room over the summer for some nausea and vomiting.  In review of chart her CT there and ultrasound showed a large liver lesion.  Radiology recommended an MRI to further evaluate.  Reports they told her in the emergency room that this was a benign lesion, but that she could follow-up with me for an MRI if she wanted.  She was not sure she wanted to do this.  Today she agrees, after I recommend that was the recommendation from the radiologist per the report.  Denies abdominal pain, unexplained weight loss.  She actually has been working on her diet extensively to try to reduce her weight and is happy with her weight loss.  She also had an incidental finding of aortic atherosclerosis on the CT.  Is a family history of heart disease.  AWV 09/08/17 Due for flu vaccine, Vit D check, lipid check if fasting  Obesity/Dyslipidemia/AOrtic atherosclerosis: -wt 170 (5/19) --> 162 (11/19) -cholesterol improving 2019  Osteoporosis/Hx Fracture/Hx Low Vit D: -sees ortho -tibia fx 2018 -last dexa 2019 - she declined medications and preferred to do Vot D, weight bearing exercise, healthy diet  Hx [name] Lesion/GERD: -sees GI  Mild intermittent Asthma: -alb prn  Mild  anemia: -resolved  ROS: See pertinent positives and negatives per HPI.  Past Medical History:  Diagnosis Date  . Adenomatous colon polyp 09/04/2014  . Asthma    albuterol-rescue inhaler-uses 1-2 x year  . Bone disease, metabolic 1/54/0086  . [name] lesion, chronic    on sm bowel capsule 06/2014  . Closed fracture of lateral portion of left tibial plateau 07/23/2016  . Complication of anesthesia    slow to awaken  . Fundic gland polyps of stomach, benign   . GERD (gastroesophageal reflux disease)    no meds  . Hiatal hernia   . 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 Iron deficiency anemia due to chronic blood loss 07/23/2014  . Vitamin D deficiency 08/26/2016    Past Surgical History:  Procedure Laterality Date  . ANTERIOR AND POSTERIOR REPAIR  02/22/2011   Procedure: ANTERIOR (CYSTOCELE) AND POSTERIOR REPAIR (RECTOCELE);  Surgeon: Delice Lesch, MD;  Location: [facility];  Service: Gynecology;  Laterality: N/A;  . COLONOSCOPY    . CYSTOSCOPY  02/22/2011   Procedure: CYSTOSCOPY;  Surgeon: Delice Lesch, MD;  Location: [facility];  Service: Gynecology;  Laterality: N/A;  . DILATION AND CURETTAGE OF UTERUS  1980  . EXTERNAL FIXATION LEG Left 07/24/2016   Procedure: EXTERNAL FIXATION LEFT LOWER LEG;  Surgeon: Rod Can, MD;  Location: [facility];  Service: Orthopedics;  Laterality: Left;  . ORIF TIBIA PLATEAU Left 07/27/2016   Procedure: OPEN REDUCTION INTERNAL FIXATION (ORIF) TIBIAL PLATEAU;  Surgeon: Altamese Dilkon, MD;  Location:  [facility];  Service: Orthopedics;  Laterality: Left;  . SALPINGOOPHORECTOMY  02/22/2011   Procedure: SALPINGO OOPHERECTOMY;  Surgeon: Delice Lesch, MD;  Location: [facility];  Service: Gynecology;  Laterality: Bilateral;  . VAGINAL HYSTERECTOMY  02/22/2011   Procedure: HYSTERECTOMY VAGINAL;  Surgeon: Delice Lesch, MD;  Location: [facility];  Service: Gynecology;  Laterality: N/A;  . WISDOM TOOTH EXTRACTION      Family  History  Problem Relation Age of Onset  . Diabetes Father   . Lung cancer Sister 18  . Heart disease Maternal Grandmother   . Heart disease Maternal Grandfather   . Kidney disease Maternal Grandfather   . Colon cancer Neg Hx   . Colon polyps Neg Hx   . Esophageal cancer Neg Hx   . Gallbladder disease Neg Hx   . Breast cancer Neg Hx     SOCIAL HX: See HPI   Current Outpatient Medications:  .  albuterol (PROVENTIL HFA;VENTOLIN HFA) 108 (90 Base) MCG/ACT inhaler, Inhale 2 puffs into the lungs every 6 (six) hours as needed for wheezing or shortness of breath., Disp: 1 Inhaler, Rfl: 3 .  Cholecalciferol (VITAMIN D) 2000 units CAPS, Take 2,000 Units by mouth daily. , Disp: , Rfl:  .  ferrous sulfate 325 (65 FE) MG tablet, Take 325 mg by mouth daily with breakfast., Disp: , Rfl:  .  Multiple Vitamin (MULTIVITAMIN WITH MINERALS) TABS tablet, Take 1 tablet by mouth daily., Disp: , Rfl:   EXAM:  Vitals:   03/09/18 0802  BP: 122/70  Pulse: (!) 57  Temp: 97.8 F (36.6 C)    Body mass index is 29.69 kg/m.  GENERAL: vitals reviewed and listed above, alert, oriented, appears well hydrated and in no acute distress  HEENT: atraumatic, conjunttiva clear, no obvious abnormalities on inspection of external nose and ears, normal appearance of ear canals and TMs, clear nasal congestion, mild post oropharyngeal erythema with PND, no tonsillar edema or exudate, no sinus TTP  NECK: no obvious masses on inspection, no bruits  LUNGS: clear to auscultation bilaterally, no wheezes, rales or rhonchi, good air movement  CV: HRRR, no peripheral edema  MS: moves all extremities without noticeable abnormality  PSYCH: pleasant and cooperative, no obvious depression or anxiety  NEURO: Cranial nerves II through XII grossly intact, finger-nose normal, speech and thought processing grossly intact, Dix-Hallpike is mildly positive to the right with reproduction of symptoms.  ASSESSMENT AND  PLAN:  Discussed the following assessment and plan:  Vitamin D deficiency - Plan: VITAMIN D 25 Hydroxy (Vit-D Deficiency, Fractures)  Osteoporosis, unspecified osteoporosis type, unspecified pathological fracture presence  B12 deficiency  Dyslipidemia - Plan: Lipid panel  Vertigo  Overweight (BMI 25.0-29.9)  Liver mass - Plan: MR LIVER W CONTRAST  Serum calcium elevated - Plan: Basic metabolic panel  Need for immunization against influenza - Plan: Flu vaccine HIGH DOSE PF (Fluzone High dose)  -Labs per orders -Congratulated on lifestyle changes and encouraged her to continue a healthy diet, suggested the Mediterranean diet as this is good for reducing cardiovascular risk -We discussed the various potential etiologies of vertigo, risk, as for evaluation.  Suspect this is positional vertigo.  Provided home exercises.  Recommended not to drive.  Recommended if not resolving at home over the next few days with exercises to call him with you to be the rehab.  Advised prompt follow-up if any other symptoms or if does not resolve over the next 2 to 3 weeks.  Advised would need MRI  and further evaluation at that point.  Advised not to drive with vertigo.  Handout given. -Discussed the findings from her CT scan in the emergency room.  I advised that she go ahead and get the MRI that was recommended.  Order placed.  Recommended she follow-up with GI about this regardless of findings. -Given the atherosclerosis of the aorta, we discussed this and reduction of cardiovascular risk.  Offered a low-dose statin, she declined.  Also talked about risk benefits of aspirin, she will discuss with GI. -Follow-up 3 to 4 months, sooner if needed, 3 to 4 weeks if any residual vertigo -Patient advised to return or notify a doctor immediately if symptoms worsen or persist or new concerns arise.  Patient Instructions  BEFORE YOU LEAVE: -flu shot -labs -due for MRI of liver - ordered. Please let debbie know can  do anywhere. -follow up: 3 months  Please see the link below for more information and a treatment for vertigo.  [email or web address]  Please do not drive with vertigo.  Call me if any vertigo remains in 3 weeks and schedule follow up  I hope you are feeling better soon! Seek care promptly if your symptoms worsen, new concerns arise or you are not improving with treatment.   We have ordered labs or studies at this visit. It can take up to 1-2 weeks for results and processing. IF results require follow up or explanation, we will call you with instructions. Clinically stable results will be released to your [facility]. If you have not heard from [place] or cannot find your results in [facility] in 2 weeks please contact our office at [phone].  If you are not yet signed up for [facility], please consider signing up.   We recommend the following healthy lifestyle for LIFE: 1) Small portions. But, make sure to get regular (at least 3 per day), healthy meals and small healthy snacks if needed.  2) Eat a healthy clean diet.   TRY TO EAT: -at least 5-7 servings of low sugar, colorful, and nutrient rich vegetables per day (not corn, potatoes or bananas.) -berries are the best choice if you wish to eat fruit (only eat small amounts if trying to reduce weight)  -lean meets (fish, white meat of chicken or [place]) -vegan proteins for some meals - beans or tofu, whole grains, nuts and seeds -Replace bad fats with good fats - good fats include: fish, nuts and seeds, canola oil, olive oil -small amounts of low fat or non fat dairy -small amounts of100 % whole grains - check the lables -drink plenty of water  AVOID: -SUGAR, sweets, anything with added sugar, corn syrup or sweeteners - must read labels as even foods advertised as "healthy" often are loaded with sugar -if you must have a sweetener, small amounts of stevia may be best -sweetened beverages and artificially sweetened  beverages -simple starches (rice, bread, potatoes, pasta, chips, etc - small amounts of 100% whole grains are ok) -red meat, pork, butter -fried foods, fast food, processed food, excessive dairy, eggs and coconut.  3)Get at least 150 minutes of sweaty aerobic exercise per week.  4)Reduce stress - consider counseling, meditation and relaxation to balance other aspects of your life.          Lucretia Kern, DO

## 2018-03-09 ENCOUNTER — Ambulatory Visit (INDEPENDENT_AMBULATORY_CARE_PROVIDER_SITE_OTHER): Payer: Medicare Other | Admitting: Family Medicine

## 2018-03-09 ENCOUNTER — Encounter: Payer: Self-pay | Admitting: Family Medicine

## 2018-03-09 VITALS — BP 122/70 | HR 57 | Temp 97.8°F | Ht 62.0 in | Wt 162.3 lb

## 2018-03-09 DIAGNOSIS — R42 Dizziness and giddiness: Secondary | ICD-10-CM | POA: Diagnosis not present

## 2018-03-09 DIAGNOSIS — E785 Hyperlipidemia, unspecified: Secondary | ICD-10-CM

## 2018-03-09 DIAGNOSIS — R160 Hepatomegaly, not elsewhere classified: Secondary | ICD-10-CM | POA: Diagnosis not present

## 2018-03-09 DIAGNOSIS — E538 Deficiency of other specified B group vitamins: Secondary | ICD-10-CM | POA: Diagnosis not present

## 2018-03-09 DIAGNOSIS — E559 Vitamin D deficiency, unspecified: Secondary | ICD-10-CM | POA: Diagnosis not present

## 2018-03-09 DIAGNOSIS — E663 Overweight: Secondary | ICD-10-CM | POA: Diagnosis not present

## 2018-03-09 DIAGNOSIS — M810 Age-related osteoporosis without current pathological fracture: Secondary | ICD-10-CM | POA: Diagnosis not present

## 2018-03-09 DIAGNOSIS — D1803 Hemangioma of intra-abdominal structures: Secondary | ICD-10-CM | POA: Insufficient documentation

## 2018-03-09 DIAGNOSIS — Z23 Encounter for immunization: Secondary | ICD-10-CM

## 2018-03-09 LAB — LIPID PANEL
CHOL/HDL RATIO: 5
Cholesterol: 199 mg/dL (ref 0–200)
HDL: 43 mg/dL (ref >39.00)
LDL Cholesterol: 130 mg/dL — ABNORMAL HIGH (ref 0–99)
NONHDL: 156.03
Triglycerides: 128 mg/dL (ref 0.0–149.0)
VLDL: 25.6 mg/dL (ref 0.0–40.0)

## 2018-03-09 LAB — BASIC METABOLIC PANEL
BUN: 18 mg/dL (ref 6–23)
CALCIUM: 10 mg/dL (ref 8.4–10.5)
CO2: 26 mEq/L (ref 19–32)
CREATININE: 0.72 mg/dL (ref 0.40–1.20)
Chloride: 105 mEq/L (ref 96–112)
GFR: 85.83 mL/min (ref >60.00)
Glucose, Bld: 91 mg/dL (ref 70–99)
Potassium: 4.5 mEq/L (ref 3.5–5.1)
SODIUM: 139 meq/L (ref 135–145)

## 2018-03-09 LAB — VITAMIN D 25 HYDROXY (VIT D DEFICIENCY, FRACTURES): VITD: 30.6 ng/mL (ref 30.00–100.00)

## 2018-03-09 NOTE — Patient Instructions (Addendum)
BEFORE YOU LEAVE: -flu shot -labs -due for MRI of liver - ordered. Please let debbie know can do anywhere. -follow up: 3 months  Please see the link below for more information and a treatment for vertigo.  [email or web address]  Please do not drive with vertigo.  Call me if any vertigo remains in 3 weeks and schedule follow up  I hope you are feeling better soon! Seek care promptly if your symptoms worsen, new concerns arise or you are not improving with treatment.   We have ordered labs or studies at this visit. It can take up to 1-2 weeks for results and processing. IF results require follow up or explanation, we will call you with instructions. Clinically stable results will be released to your [facility]. If you have not heard from [place] or cannot find your results in [facility] in 2 weeks please contact our office at [phone].  If you are not yet signed up for [facility], please consider signing up.   We recommend the following healthy lifestyle for LIFE: 1) Small portions. But, make sure to get regular (at least 3 per day), healthy meals and small healthy snacks if needed.  2) Eat a healthy clean diet.   TRY TO EAT: -at least 5-7 servings of low sugar, colorful, and nutrient rich vegetables per day (not corn, potatoes or bananas.) -berries are the best choice if you wish to eat fruit (only eat small amounts if trying to reduce weight)  -lean meets (fish, white meat of chicken or [place]) -vegan proteins for some meals - beans or tofu, whole grains, nuts and seeds -Replace bad fats with good fats - good fats include: fish, nuts and seeds, canola oil, olive oil -small amounts of low fat or non fat dairy -small amounts of100 % whole grains - check the lables -drink plenty of water  AVOID: -SUGAR, sweets, anything with added sugar, corn syrup or sweeteners - must read labels as even foods advertised as "healthy" often are loaded with sugar -if you must have a  sweetener, small amounts of stevia may be best -sweetened beverages and artificially sweetened beverages -simple starches (rice, bread, potatoes, pasta, chips, etc - small amounts of 100% whole grains are ok) -red meat, pork, butter -fried foods, fast food, processed food, excessive dairy, eggs and coconut.  3)Get at least 150 minutes of sweaty aerobic exercise per week.  4)Reduce stress - consider counseling, meditation and relaxation to balance other aspects of your life.

## 2018-03-10 ENCOUNTER — Telehealth: Payer: Self-pay | Admitting: Family Medicine

## 2018-03-10 DIAGNOSIS — K769 Liver disease, unspecified: Secondary | ICD-10-CM

## 2018-03-10 NOTE — Telephone Encounter (Signed)
Please advise 

## 2018-03-10 NOTE — Telephone Encounter (Signed)
Copied from [place] [id number]. Topic: Quick Communication - See Telephone Encounter >> Mar 10, 2018 10:03 AM Blase Mess A wrote: CRM for notification. See Telephone encounter for: 03/10/18. Angel Irwin is calling from [facility] is requesting new orders MRI abdminal with and with out contrast Orders can be changed in Epic please advise [phone]

## 2018-03-10 NOTE — Telephone Encounter (Signed)
Ok please change

## 2018-03-13 NOTE — Telephone Encounter (Signed)
Order entered

## 2018-03-26 ENCOUNTER — Ambulatory Visit
Admission: RE | Admit: 2018-03-26 | Discharge: 2018-03-26 | Disposition: A | Discharge status: Home / Self Care | Payer: Medicare Other | Source: Ambulatory Visit | Attending: Family Medicine | Admitting: Family Medicine

## 2018-03-26 ENCOUNTER — Other Ambulatory Visit: Payer: Medicare Other

## 2018-03-26 DIAGNOSIS — K7689 Other specified diseases of liver: Secondary | ICD-10-CM | POA: Diagnosis not present

## 2018-03-26 DIAGNOSIS — K769 Liver disease, unspecified: Secondary | ICD-10-CM

## 2018-03-26 MED ORDER — GADOBENATE DIMEGLUMINE 529 MG/ML IV SOLN
15.0000 mL | Freq: Once | INTRAVENOUS | Status: AC | PRN
  Administered 2018-03-26: 15 mL via INTRAVENOUS

## 2018-06-08 NOTE — Progress Notes (Signed)
HPI:  Using dictation device. Unfortunately this device frequently misinterprets words/phrases.  Angel Irwin is a pleasant 68 y.o. here for follow up. Chronic medical problems summarized below were reviewed for changes and stability and were updated as needed below. These issues and their treatment remain stable for the most part.  Reports doing well for the most part. Continues to work on diet and exercise and is happy has lost a few more pounds. Has a lump in area where she had knee surgery 2 years ago - just noticed recently - not painful or red. Plans to contact ortho. Denies CP, SOB, DOE, treatment intolerance or new symptoms.  Obesity/Dyslipidemia/Aortic atherosclerosis: -wt 170 (5/19) --> 162 (11/19) --> 158 -cholesterol improving 2019 -preferred lifestyle treatment over medication/statin  Osteoporosis/Hx Fracture/Hx Low Vit D: -sees ortho -tibia fx 2018 -last dexa 2019 - she declined medications and preferred to do Vit D, weight bearing exercise, healthy diet  Hx [name] Lesion/GERD: -hx anemia related to this -large hiatal hernia -sees GI  Mild intermittent Asthma: -alb prn  Mild anemia: -resolved  ROS: See pertinent positives and negatives per HPI.  Past Medical History:  Diagnosis Date  . Adenomatous colon polyp 09/04/2014  . Asthma    albuterol-rescue inhaler-uses 1-2 x year  . Bone disease, metabolic 12/15/9369  . [name] lesion, chronic    on sm bowel capsule 06/2014  . Closed fracture of lateral portion of left tibial plateau 07/23/2016  . Complication of anesthesia    slow to awaken  . Fundic gland polyps of stomach, benign   . GERD (gastroesophageal reflux disease)    no meds  . Hiatal hernia   . 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 Iron deficiency anemia due to chronic blood loss 07/23/2014  . Vitamin D deficiency 08/26/2016    Past Surgical History:  Procedure Laterality Date  . ANTERIOR AND  POSTERIOR REPAIR  02/22/2011   Procedure: ANTERIOR (CYSTOCELE) AND POSTERIOR REPAIR (RECTOCELE);  Surgeon: Delice Lesch, MD;  Location: [facility];  Service: Gynecology;  Laterality: N/A;  . COLONOSCOPY    . CYSTOSCOPY  02/22/2011   Procedure: CYSTOSCOPY;  Surgeon: Delice Lesch, MD;  Location: [facility];  Service: Gynecology;  Laterality: N/A;  . DILATION AND CURETTAGE OF UTERUS  1980  . EXTERNAL FIXATION LEG Left 07/24/2016   Procedure: EXTERNAL FIXATION LEFT LOWER LEG;  Surgeon: Rod Can, MD;  Location: [place];  Service: Orthopedics;  Laterality: Left;  . ORIF TIBIA PLATEAU Left 07/27/2016   Procedure: OPEN REDUCTION INTERNAL FIXATION (ORIF) TIBIAL PLATEAU;  Surgeon: Altamese Barview, MD;  Location: [place];  Service: Orthopedics;  Laterality: Left;  . SALPINGOOPHORECTOMY  02/22/2011   Procedure: SALPINGO OOPHERECTOMY;  Surgeon: Delice Lesch, MD;  Location: [facility];  Service: Gynecology;  Laterality: Bilateral;  . VAGINAL HYSTERECTOMY  02/22/2011   Procedure: HYSTERECTOMY VAGINAL;  Surgeon: Delice Lesch, MD;  Location: [facility];  Service: Gynecology;  Laterality: N/A;  . WISDOM TOOTH EXTRACTION      Family History  Problem Relation Age of Onset  . Diabetes Father   . Lung cancer Sister 26  . Heart disease Maternal Grandmother   . Heart disease Maternal Grandfather   . Kidney disease Maternal Grandfather   . Colon cancer Neg Hx   . Colon polyps Neg Hx   . Esophageal cancer Neg Hx   . Gallbladder disease Neg Hx   . Breast cancer Neg Hx  SOCIAL HX: see hpi   Current Outpatient Medications:  .  albuterol (PROVENTIL HFA;VENTOLIN HFA) 108 (90 Base) MCG/ACT inhaler, Inhale 2 puffs into the lungs every 6 (six) hours as needed for wheezing or shortness of breath., Disp: 1 Inhaler, Rfl: 3 .  Cholecalciferol (VITAMIN D) 2000 units CAPS, Take 2,000 Units by mouth daily. , Disp: , Rfl:  .  ferrous sulfate 325 (65 FE) MG tablet, Take 325 mg by mouth daily with breakfast., Disp: , Rfl:  .   Multiple Vitamin (MULTIVITAMIN WITH MINERALS) TABS tablet, Take 1 tablet by mouth daily., Disp: , Rfl:   EXAM:  Vitals:   06/12/18 0805  BP: 102/70  Pulse: (!) 55  Temp: 97.7 F (36.5 C)    Body mass index is 29.03 kg/m.  GENERAL: vitals reviewed and listed above, alert, oriented, appears well hydrated and in no acute distress  HEENT: atraumatic, conjunttiva clear, no obvious abnormalities on inspection of external nose and ears  NECK: no obvious masses on inspection  LUNGS: clear to auscultation bilaterally, no wheezes, rales or rhonchi, good air movement  CV: HRRR, no peripheral edema  MS: moves all extremities without noticeable abnormality - small soft lump over hard protuberance in region of L upper lateral tibia  PSYCH: pleasant and cooperative, no obvious depression or anxiety  ASSESSMENT AND PLAN:  Discussed the following assessment and plan:  Dyslipidemia - Plan: Lipid panel  Vitamin D deficiency  Mild intermittent chronic asthma without complication  Knee swelling  Overweight (BMI 25.0-29.9)  -congratulated on progress with weight -advised to call her orthopedic office today for evaluation the knee - she agrees to do so and agrees to contact us if any difficlty getting in to see her specialist -labs to check lipids, discussed risks/benefits statin and she agrees may consider of further elevated, not improving -follow up AWV 3-4 months -Patient advised to return or notify a doctor immediately if symptoms worsen or persist or new concerns arise.  Patient Instructions  -follow up for AWV and follow up appointment in 3-4 months -call your orthopedic doctor today about your knee -labs   Lucretia Kern, DO

## 2018-06-12 ENCOUNTER — Encounter: Payer: Self-pay | Admitting: Family Medicine

## 2018-06-12 ENCOUNTER — Ambulatory Visit (INDEPENDENT_AMBULATORY_CARE_PROVIDER_SITE_OTHER): Payer: Medicare Other | Admitting: Family Medicine

## 2018-06-12 VITALS — BP 102/70 | HR 55 | Temp 97.7°F | Ht 62.0 in | Wt 158.7 lb

## 2018-06-12 DIAGNOSIS — E559 Vitamin D deficiency, unspecified: Secondary | ICD-10-CM

## 2018-06-12 DIAGNOSIS — M25469 Effusion, unspecified knee: Secondary | ICD-10-CM

## 2018-06-12 DIAGNOSIS — J4520 Mild intermittent asthma, uncomplicated: Secondary | ICD-10-CM

## 2018-06-12 DIAGNOSIS — E785 Hyperlipidemia, unspecified: Secondary | ICD-10-CM

## 2018-06-12 DIAGNOSIS — E663 Overweight: Secondary | ICD-10-CM

## 2018-06-12 LAB — LIPID PANEL
CHOL/HDL RATIO: 4
Cholesterol: 203 mg/dL — ABNORMAL HIGH (ref 0–200)
HDL: 46 mg/dL (ref >39.00)
LDL CALC: 137 mg/dL — AB (ref 0–99)
NONHDL: 156.58
Triglycerides: 100 mg/dL (ref 0.0–149.0)
VLDL: 20 mg/dL (ref 0.0–40.0)

## 2018-06-12 NOTE — Patient Instructions (Signed)
-  follow up for AWV and follow up appointment in 3-4 months -call your orthopedic doctor today about your knee -labs

## 2018-06-14 DIAGNOSIS — S83281D Other tear of lateral meniscus, current injury, right knee, subsequent encounter: Secondary | ICD-10-CM | POA: Diagnosis not present

## 2018-06-14 DIAGNOSIS — S82122D Displaced fracture of lateral condyle of left tibia, subsequent encounter for closed fracture with routine healing: Secondary | ICD-10-CM | POA: Diagnosis not present

## 2018-07-11 ENCOUNTER — Telehealth: Payer: Self-pay

## 2018-07-11 NOTE — Telephone Encounter (Signed)
Author phoned pt. To assess interest in virtual awv, and to reschedule current 4/28 appointment, as pt. Technically not due until after 5/23. No answer, author left detailed VM asking to reschedule.

## 2018-08-15 ENCOUNTER — Ambulatory Visit: Payer: Medicare Other

## 2018-09-13 ENCOUNTER — Encounter: Payer: Self-pay | Admitting: Family Medicine

## 2018-09-13 ENCOUNTER — Ambulatory Visit (INDEPENDENT_AMBULATORY_CARE_PROVIDER_SITE_OTHER): Payer: Medicare Other | Admitting: Family Medicine

## 2018-09-13 ENCOUNTER — Ambulatory Visit: Payer: Medicare Other

## 2018-09-13 ENCOUNTER — Other Ambulatory Visit: Payer: Self-pay

## 2018-09-13 ENCOUNTER — Telehealth: Payer: Self-pay | Admitting: *Deleted

## 2018-09-13 DIAGNOSIS — M810 Age-related osteoporosis without current pathological fracture: Secondary | ICD-10-CM

## 2018-09-13 DIAGNOSIS — J4520 Mild intermittent asthma, uncomplicated: Secondary | ICD-10-CM

## 2018-09-13 DIAGNOSIS — D1803 Hemangioma of intra-abdominal structures: Secondary | ICD-10-CM | POA: Diagnosis not present

## 2018-09-13 DIAGNOSIS — E785 Hyperlipidemia, unspecified: Secondary | ICD-10-CM

## 2018-09-13 DIAGNOSIS — D72829 Elevated white blood cell count, unspecified: Secondary | ICD-10-CM

## 2018-09-13 DIAGNOSIS — K449 Diaphragmatic hernia without obstruction or gangrene: Secondary | ICD-10-CM

## 2018-09-13 NOTE — Progress Notes (Signed)
Virtual Visit via Video Note  I connected with Angel Irwin   on 09/13/18 at  1:00 PM EDT by a video enabled telemedicine application and verified that I am speaking with the correct person using two identifiers.  Location patient: home Location provider:work office Persons participating in the virtual visit: patient, provider  I discussed the limitations of evaluation and management by telemedicine and the availability of in person appointments. The patient expressed understanding and agreed to proceed.   Angel Irwin DOB: [birth year] Encounter date: 09/13/2018  This is a 68 y.o. female who presents to establish care. Chief Complaint  Patient presents with  . Establish Care    transfer from Dr Maudie Mercury   Last visit with [facility] was 05/2018; last AWV was 08/2017  History of present illness: No major concerns today. Ready to get back to gym; has been exercising, but a lot harder. Taking walks in neighborhood. Took a couple months off.   Does sewing crafts - little pocket books, etc.   Asthma: Normally uses the albuterol twice/year. Spring is worse for her. Doesn't take anything for allergies. Doesn't like taking medications. Does use essential oils for allergies.   Working on getting cholesterol down so she doesn't have to take medication.   GERD: almost never has symptoms. Has hiatal hernia, but doesn't bother her much. She does avoid foods that are strong triggers and at times that are worse for her to eat them (ie dinner).   Anemia: iron def: documented as mild, resolved.  Liver mass: hemangioma that was most recently imaged 03/2018.   Vitamin D def: taking vitamin D and working on weight bearing exercise, healthy diet after declining medication for tx of osteoporosis. Last DEA 2019 (tibia fx 2018). Has also been taking vitamin K as well. Also spending more time outside - getting more sun. Does get calcium regularly.   Hysterectomy was done due to prolapsed uterus.   Last  colonoscopy was 06/2014; repeat in 5 years 06/2019.   Mammogram due 08/2019 (getting q 2 years 3D mammogram)  Past Medical History:  Diagnosis Date  . Adenomatous colon polyp 09/04/2014  . Asthma    albuterol-rescue inhaler-uses 1-2 x year  . Bone disease, metabolic 2/83/1517  . [name] lesion, chronic    on sm bowel capsule 06/2014  . Closed fracture of lateral portion of left tibial plateau 07/23/2016  . Complication of anesthesia    slow to awaken  . Fundic gland polyps of stomach, benign   . GERD (gastroesophageal reflux disease)    no meds  . Hiatal hernia   . 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 Iron deficiency anemia due to chronic blood loss 07/23/2014  . Vitamin D deficiency 08/26/2016   Past Surgical History:  Procedure Laterality Date  . ANTERIOR AND POSTERIOR REPAIR  02/22/2011   Procedure: ANTERIOR (CYSTOCELE) AND POSTERIOR REPAIR (RECTOCELE);  Surgeon: Delice Lesch, MD;  Location: [facility];  Service: Gynecology;  Laterality: N/A;  . COLONOSCOPY    . CYSTOSCOPY  02/22/2011   Procedure: CYSTOSCOPY;  Surgeon: Delice Lesch, MD;  Location: [facility];  Service: Gynecology;  Laterality: N/A;  . DILATION AND CURETTAGE OF UTERUS  1980  . EXTERNAL FIXATION LEG Left 07/24/2016   Procedure: EXTERNAL FIXATION LEFT LOWER LEG;  Surgeon: Rod Can, MD;  Location: [place];  Service: Orthopedics;  Laterality: Left;  . ORIF TIBIA PLATEAU Left 07/27/2016   Procedure: OPEN REDUCTION INTERNAL FIXATION (ORIF)  TIBIAL PLATEAU;  Surgeon: Altamese Dundalk, MD;  Location: [place];  Service: Orthopedics;  Laterality: Left;  . SALPINGOOPHORECTOMY  02/22/2011   Procedure: SALPINGO OOPHERECTOMY;  Surgeon: Delice Lesch, MD;  Location: [facility];  Service: Gynecology;  Laterality: Bilateral;  . VAGINAL HYSTERECTOMY  02/22/2011   Procedure: HYSTERECTOMY VAGINAL;  Surgeon: Delice Lesch, MD;  Location: [facility];  Service: Gynecology;  Laterality: N/A;  . WISDOM TOOTH  EXTRACTION     Allergies  Allergen Reactions  . No Known Allergies    Current Meds  Medication Sig  . albuterol (PROVENTIL HFA;VENTOLIN HFA) 108 (90 Base) MCG/ACT inhaler Inhale 2 puffs into the lungs every 6 (six) hours as needed for wheezing or shortness of breath.  . Cholecalciferol (VITAMIN D) 2000 units CAPS Take 2,000 Units by mouth daily.   Marland Kitchen VITAMIN K PO Take 100 mcg by mouth daily.  . [DISCONTINUED] ferrous sulfate 325 (65 FE) MG tablet Take 325 mg by mouth daily with breakfast.  . [DISCONTINUED] Multiple Vitamin (MULTIVITAMIN WITH MINERALS) TABS tablet Take 1 tablet by mouth daily.   Social History   Tobacco Use  . Smoking status: Never Smoker  . Smokeless tobacco: Never Used  Substance Use Topics  . Alcohol use: Yes    Alcohol/week: 0.0 standard drinks    Comment: Rarely   Family History  Problem Relation Age of Onset  . Diabetes Father   . Lung cancer Sister 30  . Diabetes Mellitus I Sister   . Heart disease Maternal Grandmother   . Diabetes Maternal Grandmother   . Heart disease Maternal Grandfather   . Kidney disease Maternal Grandfather   . Other Mother 27       murdered  . Other Brother 44       murdered  . Diabetes Paternal Grandmother   . Brain cancer Paternal Grandfather   . Diabetes Sister   . Colon cancer Neg Hx   . Colon polyps Neg Hx   . Esophageal cancer Neg Hx   . Gallbladder disease Neg Hx   . Breast cancer Neg Hx      Review of Systems  Constitutional: Negative for chills, fatigue and fever.  Respiratory: Negative for cough, chest tightness, shortness of breath and wheezing.   Cardiovascular: Negative for chest pain, palpitations and leg swelling.    Objective:  There were no vitals taken for this visit.      BP Readings from Last 3 Encounters:  06/12/18 102/70  03/09/18 122/70  09/28/17 107/62   Wt Readings from Last 3 Encounters:  06/12/18 158 lb 11.2 oz (72 kg)  03/09/18 162 lb 4.8 oz (73.6 kg)  09/28/17 170 lb (77.1 kg)     EXAM:  GENERAL: alert, oriented, appears well and in no acute distress  HEENT: atraumatic, conjunctiva clear, no obvious abnormalities on inspection of external nose and ears  NECK: normal movements of the head and neck  LUNGS: on inspection no signs of respiratory distress, breathing rate appears normal, no obvious gross SOB, gasping or wheezing  CV: no obvious cyanosis  MS: moves all visible extremities without noticeable abnormality  PSYCH/NEURO: pleasant and cooperative, no obvious depression or anxiety, speech and thought processing grossly intact  SKIN: no noted facial skin abnormalities.  Assessment/Plan  1. Mild intermittent chronic asthma without complication Well controlled with prn albuterol use.   2. Hemangioma of liver Repeat MRI with contrast in next 6 months for follow up for stability.  3. Hiatal hernia Asymptomatic.   4.  Osteoporosis: does not want medication for treatment but is taking vitamin D and working on weight bearing exercise. Plan to recheck DEXA 08/2019-2.   Follow up for AWV in July with HK. Then labwork repeat oct-nov and OV to follow.    I discussed the assessment and treatment plan with the patient. The patient was provided an opportunity to ask questions and all were answered. The patient agreed with the plan and demonstrated an understanding of the instructions.   The patient was advised to call back or seek an in-person evaluation if the symptoms worsen or if the condition fails to improve as anticipated.  I provided 25 minutes of non-face-to-face time during this encounter.   Micheline Rough, MD

## 2018-09-13 NOTE — Telephone Encounter (Signed)
I called the pt and informed her of the message below.  AWV visit scheduled for 7/2 with Dr Maudie Mercury.  Patient stated she call back for the lab appt and follow up visit.

## 2018-09-13 NOTE — Telephone Encounter (Signed)
-----   Message from Caren Macadam, MD sent at 09/13/2018  1:32 PM EDT ----- Let her know that recommendation for liver hemangioma is to repeat MRI in 6-12 months. We could put in order at next visit if desired. Not sure we need to have her rush out for this in next couple of months. It is fine for her July wellness visit to be doxy with HK. Then please schedule labwork visit in oct-nov and then office visit with me to follow (for hands on exam). Thanks!

## 2018-09-13 NOTE — Addendum Note (Signed)
Addended by: Caren Macadam on: 09/13/2018 11:49 PM   Modules accepted: Orders

## 2018-10-19 ENCOUNTER — Other Ambulatory Visit: Payer: Self-pay

## 2018-10-19 ENCOUNTER — Ambulatory Visit (INDEPENDENT_AMBULATORY_CARE_PROVIDER_SITE_OTHER): Payer: Medicare Other | Admitting: Family Medicine

## 2018-10-19 ENCOUNTER — Ambulatory Visit: Payer: Medicare Other

## 2018-10-19 ENCOUNTER — Encounter: Payer: Self-pay | Admitting: Family Medicine

## 2018-10-19 DIAGNOSIS — J4520 Mild intermittent asthma, uncomplicated: Secondary | ICD-10-CM

## 2018-10-19 DIAGNOSIS — M810 Age-related osteoporosis without current pathological fracture: Secondary | ICD-10-CM | POA: Diagnosis not present

## 2018-10-19 DIAGNOSIS — Z0000 Encounter for general adult medical examination without abnormal findings: Secondary | ICD-10-CM | POA: Diagnosis not present

## 2018-10-19 NOTE — Progress Notes (Signed)
Medicare Annual Preventive Care Visit  (initial annual wellness or annual wellness exam)  Virtual Visit via Video Note  I connected with Angel Irwin  on 09/05/18 at  3:20 PM EDT by a video enabled telemedicine application and verified that I am speaking with the correct person using two identifiers.  Location patient: home Location provider:work or home office Persons participating in the virtual visit: patient, provider  Concerns and/or follow up today:  Angel Irwin is a pleasant 68 y.o. here for follow up. Chronic medical problems summarized below were reviewed for changes and stability and were updated as needed below. These issues and their treatment remain stable for the most part.  Reports is doing well. She has been isolating for the most part in the [place] pandemic. Wearing a mask if does go out. Denies CP, SOB, DOE, treatment intolerance or new symptoms.  Obesity/Dyslipidemia/Aortic atherosclerosis: -wt 170 (5/19)--> 162 (11/19) --> 158 --> 160lbs (10/18/18) -cholesterol improving 2019 -preferred lifestyle treatment over medication/statin  Osteoporosis/Hx Fracture/Hx Low Vit D: -sees ortho -tibia fx 2018 -last dexa 2019 - she declined medications and preferred to do Vit D, weight bearing exercise, healthy diet  Hx [name] Lesion/GERD: -hx anemia related to this -large hiatal hernia -sees GI  Mild intermittent Asthma: -alb prn  Mild anemia: -resolved  See HM section in Epic for other details of completed HM. See scanned documentation under Media Tab for further documentation HPI, health risk assessment. See Media Tab and Care Teams sections in Epic for other providers.  ROS: negative for report of fevers, unintentional weight loss, vision changes, vision loss, hearing loss or change, chest pain, sob, hemoptysis, melena, hematochezia, hematuria, genital discharge or lesions, falls, bleeding or bruising, loc, thoughts of suicide or self harm, memory loss  1.)  Patient-completed health risk assessment  - completed and reviewed, see scanned documentation  2.) Review of Medical History: -PMH, PSH, Family History and current specialty and care providers reviewed and updated and listed below  - see scanned in document in chart and below Patient Care Team: Lucretia Kern, DO as PCP - General (Family Medicine)   Past Medical History:  Diagnosis Date  . Adenomatous colon polyp 09/04/2014  . Asthma    albuterol-rescue inhaler-uses 1-2 x year  . Bone disease, metabolic 1/61/0960  . [name] lesion, chronic    on sm bowel capsule 06/2014  . Closed fracture of lateral portion of left tibial plateau 07/23/2016  . Complication of anesthesia    slow to awaken  . Fundic gland polyps of stomach, benign   . GERD (gastroesophageal reflux disease)    no meds  . Hiatal hernia   . 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 Iron deficiency anemia due to chronic blood loss 07/23/2014  . Vitamin D deficiency 08/26/2016    Past Surgical History:  Procedure Laterality Date  . ANTERIOR AND POSTERIOR REPAIR  02/22/2011   Procedure: ANTERIOR (CYSTOCELE) AND POSTERIOR REPAIR (RECTOCELE);  Surgeon: Delice Lesch, MD;  Location: [facility];  Service: Gynecology;  Laterality: N/A;  . COLONOSCOPY    . CYSTOSCOPY  02/22/2011   Procedure: CYSTOSCOPY;  Surgeon: Delice Lesch, MD;  Location: [facility];  Service: Gynecology;  Laterality: N/A;  . DILATION AND CURETTAGE OF UTERUS  1980  . EXTERNAL FIXATION LEG Left 07/24/2016   Procedure: EXTERNAL FIXATION LEFT LOWER LEG;  Surgeon: Rod Can, MD;  Location: [place];  Service: Orthopedics;  Laterality: Left;  . ORIF  TIBIA PLATEAU Left 07/27/2016   Procedure: OPEN REDUCTION INTERNAL FIXATION (ORIF) TIBIAL PLATEAU;  Surgeon: Altamese Hillsboro, MD;  Location: [place];  Service: Orthopedics;  Laterality: Left;  . SALPINGOOPHORECTOMY  02/22/2011   Procedure: SALPINGO OOPHERECTOMY;  Surgeon: Delice Lesch, MD;  Location: [facility];  Service: Gynecology;  Laterality: Bilateral;  . VAGINAL HYSTERECTOMY  02/22/2011   Procedure: HYSTERECTOMY VAGINAL;  Surgeon: Delice Lesch, MD;  Location: [facility];  Service: Gynecology;  Laterality: N/A;  . WISDOM TOOTH EXTRACTION      Social History   Socioeconomic History  . Marital status: Married    Spouse name: Not on file  . Number of children: 4  . Years of education: Not on file  . Highest education level: Not on file  Occupational History  . Occupation: [employer]  Social Needs  . Financial resource strain: Not on file  . Food insecurity    Worry: Not on file    Inability: Not on file  . Transportation needs    Medical: Not on file    Non-medical: Not on file  Tobacco Use  . Smoking status: Never Smoker  . Smokeless tobacco: Never Used  Substance and Sexual Activity  . Alcohol use: Yes    Alcohol/week: 0.0 standard drinks    Comment: Rarely  . Drug use: No  . Sexual activity: Not on file  Lifestyle  . Physical activity    Days per week: Not on file    Minutes per session: Not on file  . Stress: Not on file  Relationships  . Social Herbalist on phone: Not on file    Gets together: Not on file    Attends religious service: Not on file    Active member of club or organization: Not on file    Attends meetings of clubs or organizations: Not on file    Relationship status: Not on file  . Intimate partner violence    Fear of current or ex partner: Not on file    Emotionally abused: Not on file    Physically abused: Not on file    Forced sexual activity: Not on file  Other Topics Concern  . 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 Diabetes Father   . Lung  cancer Sister 49  . Diabetes Mellitus I Sister   . Heart disease Maternal Grandmother   . Diabetes Maternal Grandmother   . Heart disease Maternal Grandfather   . Kidney disease Maternal Grandfather   . Other Mother 23       murdered  . Other Brother 63       murdered  . Diabetes Paternal Grandmother   . Brain cancer Paternal Grandfather   . Diabetes Sister   . Colon cancer Neg Hx   . Colon polyps Neg Hx   . Esophageal cancer Neg Hx   . Gallbladder disease Neg Hx   . Breast cancer Neg Hx     Current Outpatient Medications on File Prior to Visit  Medication Sig Dispense Refill  . albuterol (PROVENTIL HFA;VENTOLIN HFA) 108 (90 Base) MCG/ACT inhaler Inhale 2 puffs into the lungs every 6 (  six) hours as needed for wheezing or shortness of breath. 1 Inhaler 3  . Cholecalciferol (VITAMIN D) 2000 units CAPS Take 2,000 Units by mouth daily.     Marland Kitchen VITAMIN K PO Take 100 mcg by mouth daily.     No current facility-administered medications on file prior to visit.      3.) Review of functional ability and level of safety:  Any difficulty hearing?  See scanned documentation  History of falling?  See scanned documentation  Any trouble with IADLs - using a phone, using transportation, grocery shopping, preparing meals, doing housework, doing laundry, taking medications and managing money?  See scanned documentation  Advance Directives?  Discussed briefly and offered more resources. She has made her wishes known to her family. plans to work on setting up [employer].  See summary of recommendations in Patient Instructions below.  4.) Physical Exam There were no vitals filed for this visit. Estimated body mass index is 29.03 kg/m as calculated from the following:   Height as of 06/12/18: 5\' 2"  (1.575 m).   Weight as of 06/12/18: 158 lb 11.2 oz (72 kg).  EKG (optional): deferred  VITALS per patient if applicable: wt 811 lbs  GENERAL: alert, oriented, appears well and in no acute  distress; visual acuity grossly intact, full vision exam deferred due to pandemic and/or virtual encounter  HEENT: atraumatic, conjunttiva clear, no obvious abnormalities on inspection of external nose and ears  NECK: normal movements of the head and neck  LUNGS: on inspection no signs of respiratory distress, breathing rate appears normal, no obvious gross SOB, gasping or wheezing  CV: no obvious cyanosis  MS: moves all visible extremities without noticeable abnormality  PSYCH/NEURO: pleasant and cooperative, no obvious depression or anxiety, speech and thought processing grossly intact, Cognitive function grossly intact  Depression screen [facility] 2/9 10/19/2018 09/08/2017 07/29/2017  Decreased Interest 0 0 0  Down, Depressed, Hopeless 0 0 0  PHQ - 2 Score 0 0 0  Altered sleeping 1 - -  Tired, decreased energy 0 - -  Change in appetite 0 - -  Feeling bad or failure about yourself  0 - -  Trouble concentrating 0 - -  Moving slowly or fidgety/restless 0 - -  Suicidal thoughts 0 - -  PHQ-9 Score 1 - -     See patient instructions for recommendations.  Education and counseling regarding the above review of health provided with a plan for the following: -see scanned patient completed form for further details -healthy lifestyle discussed -importance and resources for completing advanced directives discussed -see patient instructions below for any other recommendations provided  4)The following written screening schedule of preventive measures were reviewed with assessment and plan made per below, orders and patient instructions:           Alcohol screening done     Obesity Screening and counseling done     STI screening (Hep C if born 55-65) offered and per pt wishes     Tobacco Screening done       Pneumococcal (PPSV23 -one dose after 64, one before if risk factors), influenza yearly and hepatitis B vaccines (if high risk - end stage renal disease, IV drugs, homosexual men, live in  home for mentally retarded, hemophilia receiving factors) ASSESSMENT/PLAN: done if applicable      Screening mammograph (yearly if >40) ASSESSMENT/PLAN: discussed and she had wanted to do every 2 years. Advised yearly if she is able and she agreed to call and schedule.  Screening Pap smear/pelvic exam (q2 years) ASSESSMENT/PLAN: n/a, declined      Colorectal cancer screening (FOBT yearly or flex sig q4y or colonoscopy q10y or barium enema q4y) ASSESSMENT/PLAN: utd or ordered      Diabetes outpatient self-management training services ASSESSMENT/PLAN: utd or done      Bone mass measurements(covered q2y if indicated - estrogen def, osteoporosis, hyperparathyroid, vertebral abnormalities, osteoporosis or steroids) ASSESSMENT/PLAN: utd or discussed and ordered per pt wishes      Screening for glaucoma(q1y if high risk - diabetes, FH, AA and > 50 or hispanic and > 65) ASSESSMENT/PLAN: utd or advised      Medical nutritional therapy for individuals with diabetes or renal disease ASSESSMENT/PLAN: see orders      Cardiovascular screening blood tests (lipids q5y) ASSESSMENT/PLAN: see orders and labs      Diabetes screening tests ASSESSMENT/PLAN: see orders and labs   7.) Summary:   Medicare annual wellness visit, subsequent  -risk factors and conditions per above assessment were discussed and treatment, recommendations and referrals were offered per documentation above and orders and patient instructions.  Osteoporosis, unspecified osteoporosis type, unspecified pathological fracture presence  -UTD on dexa, has declined medications, doing exercise and vit D3  Mild intermittent chronic asthma without complication - doing well, uses albuterol as needed  Patient Instructions    Ms. Brewton , Thank you for taking time to come for your Medicare Wellness Visit. I appreciate your ongoing commitment to your health goals. Please review the following plan we discussed and let me know if  I can assist you in the future.   These are the goals we discussed: Goals   -please schedule your mammogram -continue a healthy low sugar diet and regular weight bearing aerobic exercise -continue Vit D3 1000 IU daily -please set up advanced directives    This is a list of the screening recommended for you and due dates:  Health Maintenance  Topic Date Due  . Flu Shot  11/18/2018  . Colon Cancer Screening  06/14/2019  . Mammogram  08/30/2019  . Tetanus Vaccine  05/05/2023  . DEXA scan (bone density measurement)  Completed  .  Hepatitis C: One time screening is recommended by Center for Disease Control  (CDC) for  adults born from [birth year] through 1965.   Completed  . Pneumonia vaccines  Completed    Follow up instructions: Advised assistant Wendie Simmer to help patient arrange the following: -follow up with Dr. Maudie Mercury in 1 year -lab visit in October (pt reports Dr. Ethlyn Gallery ordered labs for October) Lucretia Kern, DO

## 2018-10-19 NOTE — Patient Instructions (Signed)
  Angel Irwin , Thank you for taking time to come for your Medicare Wellness Visit. I appreciate your ongoing commitment to your health goals. Please review the following plan we discussed and let me know if I can assist you in the future.   These are the goals we discussed: Goals   -please schedule your mammogram -continue a healthy low sugar diet and regular weight bearing aerobic exercise -continue Vit D3 1000 IU daily -please set up advanced directives    This is a list of the screening recommended for you and due dates:  Health Maintenance  Topic Date Due  . Flu Shot  11/18/2018  . Colon Cancer Screening  06/14/2019  . Mammogram  08/30/2019  . Tetanus Vaccine  05/05/2023  . DEXA scan (bone density measurement)  Completed  .  Hepatitis C: One time screening is recommended by Center for Disease Control  (CDC) for  adults born from [birth year] through 1965.   Completed  . Pneumonia vaccines  Completed

## 2018-10-24 ENCOUNTER — Telehealth: Payer: Self-pay | Admitting: *Deleted

## 2018-10-24 NOTE — Telephone Encounter (Signed)
I left a detailed message at the pts cell number to call back to schedule the appt as below.

## 2018-10-24 NOTE — Telephone Encounter (Signed)
-----   Message from Lucretia Kern, DO sent at 10/19/2018  7:57 PM EDT ----- -follow up/AWV with Dr. Maudie Mercury in 1 year-lab visit in October (pt reports Dr. Ethlyn Gallery ordered labs for October)

## 2019-05-01 DIAGNOSIS — Z20828 Contact with and (suspected) exposure to other viral communicable diseases: Secondary | ICD-10-CM | POA: Diagnosis not present

## 2019-05-10 ENCOUNTER — Other Ambulatory Visit: Payer: Self-pay

## 2019-05-11 ENCOUNTER — Other Ambulatory Visit (INDEPENDENT_AMBULATORY_CARE_PROVIDER_SITE_OTHER): Payer: Medicare Other

## 2019-05-11 DIAGNOSIS — M810 Age-related osteoporosis without current pathological fracture: Secondary | ICD-10-CM | POA: Diagnosis not present

## 2019-05-11 DIAGNOSIS — E785 Hyperlipidemia, unspecified: Secondary | ICD-10-CM

## 2019-05-11 DIAGNOSIS — D72829 Elevated white blood cell count, unspecified: Secondary | ICD-10-CM | POA: Diagnosis not present

## 2019-05-11 LAB — CBC WITH DIFFERENTIAL/PLATELET
Basophils Absolute: 0.1 10*3/uL (ref 0.0–0.1)
Basophils Relative: 1 % (ref 0.0–3.0)
Eosinophils Absolute: 0.2 10*3/uL (ref 0.0–0.7)
Eosinophils Relative: 3.2 % (ref 0.0–5.0)
HCT: 41 % (ref 36.0–46.0)
Hemoglobin: 13.6 g/dL (ref 12.0–15.0)
Lymphocytes Relative: 33.5 % (ref 12.0–46.0)
Lymphs Abs: 1.8 10*3/uL (ref 0.7–4.0)
MCHC: 33.1 g/dL (ref 30.0–36.0)
MCV: 92.3 fl (ref 78.0–100.0)
Monocytes Absolute: 0.4 10*3/uL (ref 0.1–1.0)
Monocytes Relative: 6.7 % (ref 3.0–12.0)
Neutro Abs: 3 10*3/uL (ref 1.4–7.7)
Neutrophils Relative %: 55.6 % (ref 43.0–77.0)
Platelets: 315 10*3/uL (ref 150.0–400.0)
RBC: 4.45 Mil/uL (ref 3.87–5.11)
RDW: 13.4 % (ref 11.5–15.5)
WBC: 5.4 10*3/uL (ref 4.0–10.5)

## 2019-05-11 LAB — COMPREHENSIVE METABOLIC PANEL
ALT: 20 U/L (ref 0–35)
AST: 18 U/L (ref 0–37)
Albumin: 3.9 g/dL (ref 3.5–5.2)
Alkaline Phosphatase: 96 U/L (ref 39–117)
BUN: 11 mg/dL (ref 6–23)
CO2: 27 mEq/L (ref 19–32)
Calcium: 10 mg/dL (ref 8.4–10.5)
Chloride: 105 mEq/L (ref 96–112)
Creatinine, Ser: 0.69 mg/dL (ref 0.40–1.20)
GFR: 84.52 mL/min (ref >60.00)
Glucose, Bld: 90 mg/dL (ref 70–99)
Potassium: 4.7 mEq/L (ref 3.5–5.1)
Sodium: 139 mEq/L (ref 135–145)
Total Bilirubin: 0.5 mg/dL (ref 0.2–1.2)
Total Protein: 6.7 g/dL (ref 6.0–8.3)

## 2019-05-11 LAB — LIPID PANEL
Cholesterol: 200 mg/dL (ref 0–200)
HDL: 37.9 mg/dL — ABNORMAL LOW (ref >39.00)
LDL Cholesterol: 127 mg/dL — ABNORMAL HIGH (ref 0–99)
NonHDL: 162.02
Total CHOL/HDL Ratio: 5
Triglycerides: 176 mg/dL — ABNORMAL HIGH (ref 0.0–149.0)
VLDL: 35.2 mg/dL (ref 0.0–40.0)

## 2019-05-11 LAB — TSH: TSH: 3.29 u[IU]/mL (ref 0.35–4.50)

## 2019-05-11 LAB — VITAMIN D 25 HYDROXY (VIT D DEFICIENCY, FRACTURES): VITD: 18.49 ng/mL — ABNORMAL LOW (ref 30.00–100.00)

## 2019-05-15 MED ORDER — VITAMIN D (ERGOCALCIFEROL) 1.25 MG (50000 UNIT) PO CAPS: 50000.0000 [IU] | ORAL_CAPSULE | ORAL | 1 refills | Status: DC

## 2019-05-29 DIAGNOSIS — H524 Presbyopia: Secondary | ICD-10-CM | POA: Diagnosis not present

## 2019-07-14 DIAGNOSIS — H2513 Age-related nuclear cataract, bilateral: Secondary | ICD-10-CM | POA: Diagnosis not present

## 2019-07-14 DIAGNOSIS — H40033 Anatomical narrow angle, bilateral: Secondary | ICD-10-CM | POA: Diagnosis not present

## 2019-08-02 ENCOUNTER — Encounter: Payer: Medicare Other | Admitting: Internal Medicine

## 2019-08-13 ENCOUNTER — Encounter: Payer: Medicare Other | Admitting: Internal Medicine

## 2019-08-29 DIAGNOSIS — Z0120 Encounter for dental examination and cleaning without abnormal findings: Secondary | ICD-10-CM | POA: Diagnosis not present

## 2019-09-24 DIAGNOSIS — S82122D Displaced fracture of lateral condyle of left tibia, subsequent encounter for closed fracture with routine healing: Secondary | ICD-10-CM | POA: Diagnosis not present

## 2019-09-24 DIAGNOSIS — T8484XA Pain due to internal orthopedic prosthetic devices, implants and grafts, initial encounter: Secondary | ICD-10-CM | POA: Diagnosis not present

## 2019-09-27 NOTE — Progress Notes (Signed)
Your procedure is scheduled on Monday June 14.  Report to [facility] Main Entrance "A" at 06:00 A.M., and check in at the Admitting office.  Call this number if you have problems the morning of surgery: [phone]  Call [phone] if you have any questions prior to your surgery date Monday-Friday 8am-4pm   Remember: Do not eat or drink after midnight the night before your surgery  If needed, take albuterol (PROVENTIL HFA;VENTOLIN HFA), Please bring all inhalers with you the day of surgery.    As of today, STOP taking any Aspirin (unless otherwise instructed by your surgeon), Aleve, Naproxen, Ibuprofen, Motrin, Advil, Goody's, BC's, all herbal medications, fish oil, and all vitamins.    The Morning of Surgery  Do not wear jewelry, make-up or nail polish.  Do not wear lotions, powders, or perfumes, or deodorant  Do not shave 48 hours prior to surgery.    Do not bring valuables to the hospital.  [facility] is not responsible for any belongings or valuables.  If you are a smoker, DO NOT Smoke 24 hours prior to surgery  If you wear a CPAP at night please bring your mask the morning of surgery   Remember that you must have someone to transport you home after your surgery, and remain with you for 24 hours if you are discharged the same day.   Please bring cases for contacts, glasses, hearing aids, dentures or bridgework because it cannot be worn into surgery.    Leave your suitcase in the car.  After surgery it may be brought to your room.  For patients admitted to the hospital, discharge time will be determined by your treatment team.  Patients discharged the day of surgery will not be allowed to drive home.    Special instructions:   [place]- Preparing For Surgery  Before surgery, you can play an important role. Because skin is not sterile, your skin needs to be as free of germs as possible. You can reduce the number of germs on your skin by washing with CHG  (chlorahexidine gluconate) Soap before surgery.  CHG is an antiseptic cleaner which kills germs and bonds with the skin to continue killing germs even after washing.    Oral Hygiene is also important to reduce your risk of infection.  Remember - BRUSH YOUR TEETH THE MORNING OF SURGERY WITH YOUR REGULAR TOOTHPASTE  Please do not use if you have an allergy to CHG or antibacterial soaps. If your skin becomes reddened/irritated stop using the CHG.  Do not shave (including legs and underarms) for at least 48 hours prior to first CHG shower. It is OK to shave your face.  Please follow these instructions carefully.   1. Shower the NIGHT BEFORE SURGERY and the MORNING OF SURGERY with CHG Soap.   2. If you chose to wash your hair and body, wash as usual with your normal shampoo and body-wash/soap.  3. Rinse your hair and body thoroughly to remove the shampoo and soap.  4. Apply CHG directly to the skin (ONLY FROM THE NECK DOWN) and wash gently with a scrungie or a clean washcloth.   5. Do not use on open wounds or open sores. Avoid contact with your eyes, ears, mouth and genitals (private parts). Wash Face and genitals (private parts)  with your normal soap.   6. Wash thoroughly, paying special attention to the area where your surgery will be performed.  7. Thoroughly rinse your body with warm water from the  neck down.  8. DO NOT shower/wash with your normal soap after using and rinsing off the CHG Soap.  9. Pat yourself dry with a CLEAN TOWEL.  10. Wear CLEAN PAJAMAS to bed the night before surgery  11. Place CLEAN SHEETS on your bed the night of your first shower and DO NOT SLEEP WITH PETS.  12. Wear comfortable clothes the morning of surgery.     Day of Surgery:  Please shower the morning of surgery with the CHG soap Do not apply any deodorants/lotions. Please wear clean clothes to the hospital/surgery center.   Remember to brush your teeth WITH YOUR REGULAR TOOTHPASTE.   Please  read over the following fact sheets that you were given.

## 2019-09-28 ENCOUNTER — Other Ambulatory Visit (HOSPITAL_COMMUNITY)
Admission: RE | Admit: 2019-09-28 | Discharge: 2019-09-28 | Disposition: A | Discharge status: Home / Self Care | Payer: Medicare Other | Source: Ambulatory Visit | Attending: Orthopedic Surgery | Admitting: Orthopedic Surgery

## 2019-09-28 ENCOUNTER — Encounter (HOSPITAL_COMMUNITY)
Admission: RE | Admit: 2019-09-28 | Discharge: 2019-09-28 | Disposition: A | Discharge status: Home / Self Care | Payer: Medicare Other | Source: Ambulatory Visit | Attending: Orthopedic Surgery | Admitting: Orthopedic Surgery

## 2019-09-28 ENCOUNTER — Encounter (HOSPITAL_COMMUNITY): Payer: Self-pay

## 2019-09-28 ENCOUNTER — Other Ambulatory Visit: Payer: Self-pay

## 2019-09-28 DIAGNOSIS — Z01812 Encounter for preprocedural laboratory examination: Secondary | ICD-10-CM | POA: Diagnosis not present

## 2019-09-28 DIAGNOSIS — Z20822 Contact with and (suspected) exposure to covid-19: Secondary | ICD-10-CM | POA: Diagnosis not present

## 2019-09-28 LAB — CBC
HCT: 44.4 % (ref 36.0–46.0)
Hemoglobin: 14 g/dL (ref 12.0–15.0)
MCH: 30 pg (ref 26.0–34.0)
MCHC: 31.5 g/dL (ref 30.0–36.0)
MCV: 95.1 fL (ref 80.0–100.0)
Platelets: 254 10*3/uL (ref 150–400)
RBC: 4.67 MIL/uL (ref 3.87–5.11)
RDW: 12.5 % (ref 11.5–15.5)
WBC: 5.6 10*3/uL (ref 4.0–10.5)
nRBC: 0 % (ref 0.0–0.2)

## 2019-09-28 LAB — SARS CORONAVIRUS 2 (TAT 6-24 HRS): SARS Coronavirus 2: NEGATIVE

## 2019-09-28 NOTE — Progress Notes (Signed)
PCP - Dr. Colin Benton @ [facility] Cardiologist - Denies  Chest x-ray - Not indicated EKG - Not indicated Stress Test - Denies ECHO - 2010 @ [facility] Cardiology (Has had issues in past waking up from anesthesia pt stated she had PVC's) Cardiac Cath - Denies  Sleep Study - No  DM - Denies  COVID TEST- 09/28/19  Anesthesia review: Yes prior anesthesia complications trouble waking up. Pt stated that her surgeon was using local anesthesia (block) for her  Patient denies shortness of breath, fever, cough and chest pain at PAT appointment  All instructions explained to the patient, with a verbal understanding of the material. Patient agrees to go over the instructions while at home for a better understanding. Patient also instructed to self quarantine after being tested for COVID-19. The opportunity to ask questions was provided.

## 2019-09-30 NOTE — H&P (Signed)
Orthopaedic Trauma Service (OTS) Consult   Patient ID: Angel Irwin MRN: [id number] DOB/AGE: 08/10/1950 69 y.o.    HPI: Angel Irwin is an 69 y.o. female s/p ORIF L tibial plateau 07/2016. She has done well and healed without issue but continues to have pain in her left knee.  Exam indicates symptomatic hardware. Pt has tried all nonsurgical interventions without success. She presents today for [facility] L knee   Past Medical History:  Diagnosis Date  . Adenomatous colon polyp 09/04/2014  . Asthma    albuterol-rescue inhaler-uses 1-2 x year  . Bone disease, metabolic 7/35/3299  . [name] lesion, chronic    on sm bowel capsule 06/2014  . Closed fracture of lateral portion of left tibial plateau 07/23/2016  . Complication of anesthesia    slow to awaken  . Fundic gland polyps of stomach, benign   . GERD (gastroesophageal reflux disease)    no meds  . Hiatal hernia   . History of kidney stones 09/2017  . 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 Iron deficiency anemia due to chronic blood loss 07/23/2014  . Vitamin D deficiency 08/26/2016    Past Surgical History:  Procedure Laterality Date  . ANTERIOR AND POSTERIOR REPAIR  02/22/2011   Procedure: ANTERIOR (CYSTOCELE) AND POSTERIOR REPAIR (RECTOCELE);  Surgeon: Delice Lesch, MD;  Location: [facility];  Service: Gynecology;  Laterality: N/A;  . COLONOSCOPY    . CYSTOSCOPY  02/22/2011   Procedure: CYSTOSCOPY;  Surgeon: Delice Lesch, MD;  Location: [facility];  Service: Gynecology;  Laterality: N/A;  . DILATION AND CURETTAGE OF UTERUS  1980  . EXTERNAL FIXATION LEG Left 07/24/2016   Procedure: EXTERNAL FIXATION LEFT LOWER LEG;  Surgeon: Rod Can, MD;  Location: [facility];  Service: Orthopedics;  Laterality: Left;  . ORIF TIBIA PLATEAU Left 07/27/2016   Procedure: OPEN REDUCTION INTERNAL FIXATION (ORIF) TIBIAL PLATEAU;  Surgeon: Altamese [place], MD;  Location: [facility];  Service:  Orthopedics;  Laterality: Left;  . SALPINGOOPHORECTOMY  02/22/2011   Procedure: SALPINGO OOPHERECTOMY;  Surgeon: Delice Lesch, MD;  Location: [facility];  Service: Gynecology;  Laterality: Bilateral;  . VAGINAL HYSTERECTOMY  02/22/2011   Procedure: HYSTERECTOMY VAGINAL;  Surgeon: Delice Lesch, MD;  Location: [facility];  Service: Gynecology;  Laterality: N/A;  . WISDOM TOOTH EXTRACTION      Family History  Problem Relation Age of Onset  . Diabetes Father   . Lung cancer Sister 59  . Diabetes Mellitus I Sister   . Heart disease Maternal Grandmother   . Diabetes Maternal Grandmother   . Heart disease Maternal Grandfather   . Kidney disease Maternal Grandfather   . Other Mother 33       murdered  . Other Brother 68       murdered  . Diabetes Paternal Grandmother   . Brain cancer Paternal Grandfather   . Diabetes Sister   . Colon cancer Neg Hx   . Colon polyps Neg Hx   . Esophageal cancer Neg Hx   . Gallbladder disease Neg Hx   . Breast cancer Neg Hx     Social History:  reports that she has never smoked. She has never used smokeless tobacco. She reports current alcohol use. She reports that she does not use drugs.  Allergies:  Allergies  Allergen Reactions  . No Known Allergies     Medications: I have reviewed the patient's  current medications. Current Meds  Medication Sig  . albuterol (PROVENTIL HFA;VENTOLIN HFA) 108 (90 Base) MCG/ACT inhaler Inhale 2 puffs into the lungs every 6 (six) hours as needed for wheezing or shortness of breath.  Marland Kitchen VITAMIN D PO Take 6,000 Units by mouth daily. With K2 100 mcg     No results found for this or any previous visit (from the past 48 hour(s)).  No results found.  Review of Systems  Constitutional: Negative for chills and fever.  HENT: Negative for hearing loss.   Eyes: Negative for blurred vision and double vision.  Respiratory: Negative for shortness of breath.   Cardiovascular: Negative for chest pain and palpitations.   Gastrointestinal: Negative for abdominal pain and vomiting.  Genitourinary: Negative for dysuria.  Neurological: Negative for tingling and tremors.   There were no vitals taken for this visit. Physical Exam Vitals reviewed.  Constitutional:      Appearance: Normal appearance. She is not ill-appearing.  HENT:     Head: Normocephalic and atraumatic.  Cardiovascular:     Rate and Rhythm: Normal rate and regular rhythm.     Pulses: Normal pulses.     Heart sounds: Normal heart sounds.  Pulmonary:     Effort: Pulmonary effort is normal.     Breath sounds: Normal breath sounds.  Musculoskeletal:     Comments: Left Lower Extremity  Surgical wound lateral L knee well healed  No swelling No knee effusion  No joint line pain  TTP over hardware  Excellent knee ROM  Distal motor and sensory functions intact No DCT  Compartments are soft   Skin:    General: Skin is warm and dry.     Capillary Refill: Capillary refill takes less than 2 seconds.  Neurological:     General: No focal deficit present.     Mental Status: She is alert and oriented to person, place, and time.  Psychiatric:        Mood and Affect: Mood normal.        Thought Content: Thought content normal.        Judgment: Judgment normal.     Assessment/Plan:  69 y/o female with symptomatic HW L knee s/p ORIF L tibial plateau in 2018  -symptomatic hardware L knee   OR for [facility]   No restrictions post op   WBAT post op  ROM post op  Plan for outpt procedure, discharge from PACU  - Pain management:  Short course of Norco post op  toradol post op x 5 days  - ID:   periop abx   - FEN/GI prophylaxis/Foley/Lines:  NPO   Reg diet post op   - Dispo:  OR for [place], PA-C [phone] (C) 09/30/2019, 8:35 PM  [facility] [place] [phone] Domingo Sep (F)

## 2019-09-30 NOTE — Anesthesia Preprocedure Evaluation (Addendum)
Anesthesia Evaluation  Patient identified by MRN, date of birth, ID band Patient awake    Reviewed: Allergy & Precautions, H&P , NPO status , Patient's Chart, lab work & pertinent test results  History of Anesthesia Complications (+) PROLONGED EMERGENCE and history of anesthetic complications  Airway Mallampati: II  TM Distance: >3 FB Neck ROM: Full    Dental no notable dental hx. (+) Teeth Intact, Dental Advisory Given, Caps   Pulmonary neg pulmonary ROS, asthma ,    Pulmonary exam normal breath sounds clear to auscultation       Cardiovascular Exercise Tolerance: Good negative cardio ROS Normal cardiovascular exam Rhythm:Regular Rate:Normal     Neuro/Psych negative neurological ROS  negative psych ROS   GI/Hepatic negative GI ROS, Neg liver ROS, hiatal hernia, PUD, GERD  ,  Endo/Other  negative endocrine ROS  Renal/GU negative Renal ROS  negative genitourinary   Musculoskeletal negative musculoskeletal ROS (+) Arthritis , Osteoarthritis,    Abdominal   Peds negative pediatric ROS (+)  Hematology negative hematology ROS (+) Blood dyscrasia, anemia ,   Anesthesia Other Findings   Reproductive/Obstetrics negative OB ROS                            Anesthesia Physical Anesthesia Plan  ASA: II  Anesthesia Plan: General   Post-op Pain Management:    Induction: Intravenous  PONV Risk Score and Plan: Ondansetron, TIVA, Treatment may vary due to age or medical condition and Dexamethasone  Airway Management Planned: LMA  Additional Equipment:   Intra-op Plan:   Post-operative Plan: Extubation in OR  Informed Consent: I have reviewed the patients History and Physical, chart, labs and discussed the procedure including the risks, benefits and alternatives for the proposed anesthesia with the patient or authorized representative who has indicated his/her understanding and acceptance.        Plan Discussed with: CRNA, Surgeon and Anesthesiologist  Anesthesia Plan Comments: ( )       Anesthesia Quick Evaluation

## 2019-10-01 ENCOUNTER — Ambulatory Visit (HOSPITAL_COMMUNITY): Payer: Medicare Other | Admitting: Physician Assistant

## 2019-10-01 ENCOUNTER — Encounter (HOSPITAL_COMMUNITY)
Admission: RE | Disposition: A | Discharge status: Home / Self Care | Payer: Self-pay | Source: Home / Self Care | Attending: Orthopedic Surgery

## 2019-10-01 ENCOUNTER — Ambulatory Visit (HOSPITAL_COMMUNITY)
Admission: RE | Admit: 2019-10-01 | Discharge: 2019-10-01 | Disposition: A | Discharge status: Home / Self Care | Payer: Medicare Other | Attending: Orthopedic Surgery | Admitting: Orthopedic Surgery

## 2019-10-01 ENCOUNTER — Ambulatory Visit (HOSPITAL_COMMUNITY): Payer: Medicare Other

## 2019-10-01 ENCOUNTER — Encounter (HOSPITAL_COMMUNITY): Payer: Self-pay | Admitting: Orthopedic Surgery

## 2019-10-01 ENCOUNTER — Ambulatory Visit (HOSPITAL_COMMUNITY): Payer: Medicare Other | Admitting: Certified Registered Nurse Anesthetist

## 2019-10-01 DIAGNOSIS — T1490XS Injury, unspecified, sequela: Secondary | ICD-10-CM | POA: Diagnosis not present

## 2019-10-01 DIAGNOSIS — E559 Vitamin D deficiency, unspecified: Secondary | ICD-10-CM | POA: Insufficient documentation

## 2019-10-01 DIAGNOSIS — D500 Iron deficiency anemia secondary to blood loss (chronic): Secondary | ICD-10-CM | POA: Diagnosis not present

## 2019-10-01 DIAGNOSIS — J45909 Unspecified asthma, uncomplicated: Secondary | ICD-10-CM | POA: Diagnosis not present

## 2019-10-01 DIAGNOSIS — T84117A Breakdown (mechanical) of internal fixation device of bone of left lower leg, initial encounter: Secondary | ICD-10-CM | POA: Diagnosis not present

## 2019-10-01 DIAGNOSIS — Z472 Encounter for removal of internal fixation device: Secondary | ICD-10-CM | POA: Diagnosis not present

## 2019-10-01 DIAGNOSIS — Y831 Surgical operation with implant of artificial internal device as the cause of abnormal reaction of the patient, or of later complication, without mention of misadventure at the time of the procedure: Secondary | ICD-10-CM | POA: Insufficient documentation

## 2019-10-01 DIAGNOSIS — T84298A Other mechanical complication of internal fixation device of other bones, initial encounter: Secondary | ICD-10-CM | POA: Diagnosis not present

## 2019-10-01 DIAGNOSIS — K449 Diaphragmatic hernia without obstruction or gangrene: Secondary | ICD-10-CM | POA: Diagnosis not present

## 2019-10-01 DIAGNOSIS — T8484XA Pain due to internal orthopedic prosthetic devices, implants and grafts, initial encounter: Secondary | ICD-10-CM

## 2019-10-01 DIAGNOSIS — M12562 Traumatic arthropathy, left knee: Secondary | ICD-10-CM | POA: Diagnosis not present

## 2019-10-01 DIAGNOSIS — T1490XA Injury, unspecified, initial encounter: Secondary | ICD-10-CM

## 2019-10-01 DIAGNOSIS — X58XXXS Exposure to other specified factors, sequela: Secondary | ICD-10-CM | POA: Insufficient documentation

## 2019-10-01 DIAGNOSIS — T84418A Breakdown (mechanical) of other internal orthopedic devices, implants and grafts, initial encounter: Secondary | ICD-10-CM | POA: Diagnosis not present

## 2019-10-01 HISTORY — PX: HARDWARE REMOVAL: SHX979

## 2019-10-01 SURGERY — REMOVAL, HARDWARE
Anesthesia: General | Site: Leg Lower | Laterality: Left

## 2019-10-01 MED ORDER — MEPERIDINE HCL 25 MG/ML IJ SOLN: 6.2500 mg | INTRAMUSCULAR | Status: DC | PRN

## 2019-10-01 MED ORDER — ACETAMINOPHEN 160 MG/5ML PO SOLN: 325.0000 mg | ORAL | Status: DC | PRN

## 2019-10-01 MED ORDER — PROPOFOL 500 MG/50ML IV EMUL
INTRAVENOUS | Status: DC | PRN
  Administered 2019-10-01: 125 ug/kg/min via INTRAVENOUS
  Administered 2019-10-01: 150 ug/kg/min via INTRAVENOUS

## 2019-10-01 MED ORDER — ETODOLAC 200 MG PO CAPS: 200.0000 mg | ORAL_CAPSULE | Freq: Four times a day (QID) | ORAL | 0 refills | Status: AC

## 2019-10-01 MED ORDER — FENTANYL CITRATE (PF) 100 MCG/2ML IJ SOLN
INTRAMUSCULAR | Status: AC
  Filled 2019-10-01: qty 2

## 2019-10-01 MED ORDER — ONDANSETRON HCL 4 MG/2ML IJ SOLN: 4.0000 mg | Freq: Once | INTRAMUSCULAR | Status: DC | PRN

## 2019-10-01 MED ORDER — FENTANYL CITRATE (PF) 250 MCG/5ML IJ SOLN
INTRAMUSCULAR | Status: AC
  Filled 2019-10-01: qty 5

## 2019-10-01 MED ORDER — ACETAMINOPHEN 500 MG PO TABS
500.0000 mg | ORAL_TABLET | Freq: Two times a day (BID) | ORAL | 0 refills | Status: DC
Start: 2019-10-01 — End: 2020-06-06

## 2019-10-01 MED ORDER — OXYCODONE HCL 5 MG PO TABS
ORAL_TABLET | ORAL | Status: AC
  Filled 2019-10-01: qty 1

## 2019-10-01 MED ORDER — ORAL CARE MOUTH RINSE: 15.0000 mL | Freq: Once | OROMUCOSAL | Status: AC

## 2019-10-01 MED ORDER — LACTATED RINGERS IV SOLN: INTRAVENOUS | Status: DC

## 2019-10-01 MED ORDER — PROPOFOL 10 MG/ML IV BOLUS
INTRAVENOUS | Status: DC | PRN
  Administered 2019-10-01 (×2): 50 mg via INTRAVENOUS
  Administered 2019-10-01: 150 mg via INTRAVENOUS

## 2019-10-01 MED ORDER — OXYCODONE HCL 5 MG/5ML PO SOLN: 5.0000 mg | Freq: Once | ORAL | Status: AC | PRN

## 2019-10-01 MED ORDER — PROPOFOL 10 MG/ML IV BOLUS
INTRAVENOUS | Status: AC
  Filled 2019-10-01: qty 20

## 2019-10-01 MED ORDER — 0.9 % SODIUM CHLORIDE (POUR BTL) OPTIME
TOPICAL | Status: DC | PRN
  Administered 2019-10-01: 1000 mL

## 2019-10-01 MED ORDER — OXYCODONE HCL 5 MG PO TABS
5.0000 mg | ORAL_TABLET | Freq: Once | ORAL | Status: AC | PRN
  Administered 2019-10-01: 5 mg via ORAL

## 2019-10-01 MED ORDER — LIDOCAINE 2% (20 MG/ML) 5 ML SYRINGE
INTRAMUSCULAR | Status: DC | PRN
  Administered 2019-10-01: 100 mg via INTRAVENOUS

## 2019-10-01 MED ORDER — CEFAZOLIN SODIUM-DEXTROSE 2-4 GM/100ML-% IV SOLN
2.0000 g | INTRAVENOUS | Status: AC
  Administered 2019-10-01: 2 g via INTRAVENOUS
  Filled 2019-10-01: qty 100

## 2019-10-01 MED ORDER — FENTANYL CITRATE (PF) 250 MCG/5ML IJ SOLN
INTRAMUSCULAR | Status: DC | PRN
  Administered 2019-10-01: 50 ug via INTRAVENOUS

## 2019-10-01 MED ORDER — DEXAMETHASONE SODIUM PHOSPHATE 10 MG/ML IJ SOLN
INTRAMUSCULAR | Status: DC | PRN
  Administered 2019-10-01: 5 mg via INTRAVENOUS

## 2019-10-01 MED ORDER — ONDANSETRON HCL 4 MG/2ML IJ SOLN
INTRAMUSCULAR | Status: DC | PRN
  Administered 2019-10-01: 4 mg via INTRAVENOUS

## 2019-10-01 MED ORDER — FENTANYL CITRATE (PF) 100 MCG/2ML IJ SOLN
25.0000 ug | INTRAMUSCULAR | Status: DC | PRN
  Administered 2019-10-01: 50 ug via INTRAVENOUS

## 2019-10-01 MED ORDER — CHLORHEXIDINE GLUCONATE 0.12 % MT SOLN
15.0000 mL | Freq: Once | OROMUCOSAL | Status: AC
  Administered 2019-10-01: 15 mL via OROMUCOSAL
  Filled 2019-10-01: qty 15

## 2019-10-01 MED ORDER — ACETAMINOPHEN 325 MG PO TABS: 325.0000 mg | ORAL_TABLET | ORAL | Status: DC | PRN

## 2019-10-01 MED ORDER — MIDAZOLAM HCL 2 MG/2ML IJ SOLN
INTRAMUSCULAR | Status: AC
  Filled 2019-10-01: qty 2

## 2019-10-01 MED ORDER — HYDROCODONE-ACETAMINOPHEN 5-325 MG PO TABS: 1.0000 | ORAL_TABLET | Freq: Three times a day (TID) | ORAL | 0 refills | Status: DC | PRN

## 2019-10-01 MED ORDER — ONDANSETRON HCL 4 MG/5ML PO SOLN: 4.0000 mg | Freq: Three times a day (TID) | ORAL | 0 refills | Status: DC | PRN

## 2019-10-01 MED ORDER — MIDAZOLAM HCL 5 MG/5ML IJ SOLN
INTRAMUSCULAR | Status: DC | PRN
  Administered 2019-10-01: 2 mg via INTRAVENOUS

## 2019-10-01 SURGICAL SUPPLY — 68 items
BANDAGE ESMARK 6X9 LF (GAUZE/BANDAGES/DRESSINGS) ×1 IMPLANT
BNDG CMPR 9X6 STRL LF SNTH (GAUZE/BANDAGES/DRESSINGS) ×1
BNDG COHESIVE 6X5 TAN STRL LF (GAUZE/BANDAGES/DRESSINGS) ×3 IMPLANT
BNDG ELASTIC 4X5.8 VLCR STR LF (GAUZE/BANDAGES/DRESSINGS) ×3 IMPLANT
BNDG ELASTIC 6X5.8 VLCR STR LF (GAUZE/BANDAGES/DRESSINGS) ×3 IMPLANT
BNDG ESMARK 6X9 LF (GAUZE/BANDAGES/DRESSINGS) ×3
BNDG GAUZE ELAST 4 BULKY (GAUZE/BANDAGES/DRESSINGS) ×4 IMPLANT
BRUSH SCRUB EZ PLAIN DRY (MISCELLANEOUS) ×6 IMPLANT
CLOSURE WOUND 1/2 X4 (GAUZE/BANDAGES/DRESSINGS)
COVER SURGICAL LIGHT HANDLE (MISCELLANEOUS) ×6 IMPLANT
COVER WAND RF STERILE (DRAPES) ×3 IMPLANT
CUFF TOURN SGL QUICK 18X4 (TOURNIQUET CUFF) IMPLANT
CUFF TOURN SGL QUICK 24 (TOURNIQUET CUFF)
CUFF TOURN SGL QUICK 34 (TOURNIQUET CUFF)
CUFF TRNQT CYL 24X4X16.5-23 (TOURNIQUET CUFF) IMPLANT
CUFF TRNQT CYL 34X4.125X (TOURNIQUET CUFF) IMPLANT
DRAPE C-ARM 42X72 X-RAY (DRAPES) IMPLANT
DRAPE C-ARMOR (DRAPES) ×3 IMPLANT
DRAPE U-SHAPE 47X51 STRL (DRAPES) ×3 IMPLANT
DRSG ADAPTIC 3X8 NADH LF (GAUZE/BANDAGES/DRESSINGS) ×3 IMPLANT
DRSG EMULSION OIL 3X3 NADH (GAUZE/BANDAGES/DRESSINGS) ×2 IMPLANT
EASYOUT QR 3.0MM (INSTRUMENTS) ×2 IMPLANT
ELECT REM PT RETURN 9FT ADLT (ELECTROSURGICAL) ×3
ELECTRODE REM PT RTRN 9FT ADLT (ELECTROSURGICAL) ×1 IMPLANT
EXTRACTOR BROKEN SCREW 3 (INSTRUMENTS) ×4 IMPLANT
EXTRACTOR EASYOUT QR 3.5 (ORTHOPEDIC DISPOSABLE SUPPLIES) ×2 IMPLANT
EXTRACTOR SCREW EASYOUT 2.5MM (INSTRUMENTS) ×2 IMPLANT
GAUZE SPONGE 4X4 12PLY STRL (GAUZE/BANDAGES/DRESSINGS) ×3 IMPLANT
GLOVE BIO SURGEON STRL SZ7.5 (GLOVE) ×3 IMPLANT
GLOVE BIO SURGEON STRL SZ8 (GLOVE) ×3 IMPLANT
GLOVE BIOGEL PI IND STRL 7.5 (GLOVE) ×1 IMPLANT
GLOVE BIOGEL PI IND STRL 8 (GLOVE) ×1 IMPLANT
GLOVE BIOGEL PI INDICATOR 7.5 (GLOVE) ×2
GLOVE BIOGEL PI INDICATOR 8 (GLOVE) ×2
GOWN STRL REUS W/ TWL LRG LVL3 (GOWN DISPOSABLE) ×2 IMPLANT
GOWN STRL REUS W/ TWL XL LVL3 (GOWN DISPOSABLE) ×1 IMPLANT
GOWN STRL REUS W/TWL LRG LVL3 (GOWN DISPOSABLE) ×6
GOWN STRL REUS W/TWL XL LVL3 (GOWN DISPOSABLE) ×3
KIT BASIN OR (CUSTOM PROCEDURE TRAY) ×3 IMPLANT
KIT TURNOVER KIT B (KITS) ×3 IMPLANT
MANIFOLD NEPTUNE II (INSTRUMENTS) ×3 IMPLANT
NEEDLE 22X1 1/2 (OR ONLY) (NEEDLE) IMPLANT
NS IRRIG 1000ML POUR BTL (IV SOLUTION) ×3 IMPLANT
PACK ORTHO EXTREMITY (CUSTOM PROCEDURE TRAY) ×3 IMPLANT
PAD ARMBOARD 7.5X6 YLW CONV (MISCELLANEOUS) ×6 IMPLANT
PADDING CAST COTTON 6X4 STRL (CAST SUPPLIES) ×5 IMPLANT
SPONGE LAP 18X18 RF (DISPOSABLE) ×3 IMPLANT
STAPLER VISISTAT 35W (STAPLE) IMPLANT
STOCKINETTE IMPERVIOUS LG (DRAPES) ×3 IMPLANT
STRIP CLOSURE SKIN 1/2X4 (GAUZE/BANDAGES/DRESSINGS) IMPLANT
SUCTION FRAZIER HANDLE 10FR (MISCELLANEOUS)
SUCTION TUBE FRAZIER 10FR DISP (MISCELLANEOUS) IMPLANT
SUT ETHILON 2 0 PSLX (SUTURE) ×2 IMPLANT
SUT ETHILON 3 0 PS 1 (SUTURE) IMPLANT
SUT PDS AB 2-0 CT1 27 (SUTURE) IMPLANT
SUT VIC AB 0 CT1 27 (SUTURE)
SUT VIC AB 0 CT1 27XBRD ANBCTR (SUTURE) IMPLANT
SUT VIC AB 1 CT1 36 (SUTURE) ×2 IMPLANT
SUT VIC AB 2-0 CT1 27 (SUTURE) ×3
SUT VIC AB 2-0 CT1 TAPERPNT 27 (SUTURE) IMPLANT
SYR CONTROL 10ML LL (SYRINGE) IMPLANT
TOWEL GREEN STERILE (TOWEL DISPOSABLE) ×6 IMPLANT
TOWEL GREEN STERILE FF (TOWEL DISPOSABLE) ×6 IMPLANT
TUBE CONNECTING 12'X1/4 (SUCTIONS) ×1
TUBE CONNECTING 12X1/4 (SUCTIONS) ×2 IMPLANT
UNDERPAD 30X36 HEAVY ABSORB (UNDERPADS AND DIAPERS) ×3 IMPLANT
WATER STERILE IRR 1000ML POUR (IV SOLUTION) ×6 IMPLANT
YANKAUER SUCT BULB TIP NO VENT (SUCTIONS) ×3 IMPLANT

## 2019-10-01 NOTE — Discharge Instructions (Signed)
Orthopaedic Trauma Service Discharge Instructions   General Discharge Instructions   WEIGHT BEARING STATUS: Weightbearing as tolerated Left leg  RANGE OF MOTION/ACTIVITY: slowly increase activity, activity as tolerated. Motion as tolerated    Wound Care: daily wound care starting on 10/04/2019. See below  Discharge Wound Care Instructions  Do NOT apply any ointments, solutions or lotions to pin sites or surgical wounds.  These prevent needed drainage and even though solutions like hydrogen peroxide kill bacteria, they also damage cells lining the pin sites that help fight infection.  Applying lotions or ointments can keep the wounds moist and can cause them to breakdown and open up as well. This can increase the risk for infection. When in doubt call the office.  Surgical incisions should be dressed daily.  If any drainage is noted, use one layer of adaptic, then gauze, Kerlix, and an ace wrap.  Once the incision is completely dry and without drainage, it may be left open to air out.  Showering may begin 36-48 hours later.  Cleaning gently with soap and water.   Diet: as you were eating previously.  Can use over the counter stool softeners and bowel preparations, such as Miralax, to help with bowel movements.  Narcotics can be constipating.  Be sure to drink plenty of fluids  PAIN MEDICATION USE AND EXPECTATIONS  You have likely been given narcotic medications to help control your pain.  After a traumatic event that results in an fracture (broken bone) with or without surgery, it is ok to use narcotic pain medications to help control one's pain.  We understand that everyone responds to pain differently and each individual patient will be evaluated on a regular basis for the continued need for narcotic medications. Ideally, narcotic medication use should last no more than 6-8 weeks (coinciding with fracture healing).   As a patient it is your responsibility as well to monitor narcotic  medication use and report the amount and frequency you use these medications when you come to your office visit.   We would also advise that if you are using narcotic medications, you should take a dose prior to therapy to maximize you participation.  IF YOU ARE ON NARCOTIC MEDICATIONS IT IS NOT PERMISSIBLE TO OPERATE A MOTOR VEHICLE (MOTORCYCLE/CAR/TRUCK/MOPED) OR HEAVY MACHINERY DO NOT MIX NARCOTICS WITH OTHER CNS (CENTRAL NERVOUS SYSTEM) DEPRESSANTS SUCH AS ALCOHOL   STOP SMOKING OR USING NICOTINE PRODUCTS!!!!  As discussed nicotine severely impairs your body's ability to heal surgical and traumatic wounds but also impairs bone healing.  Wounds and bone heal by forming microscopic blood vessels (angiogenesis) and nicotine is a vasoconstrictor (essentially, shrinks blood vessels).  Therefore, if vasoconstriction occurs to these microscopic blood vessels they essentially disappear and are unable to deliver necessary nutrients to the healing tissue.  This is one modifiable factor that you can do to dramatically increase your chances of healing your injury.    (This means no smoking, no nicotine gum, patches, etc)  DO NOT USE NONSTEROIDAL ANTI-INFLAMMATORY DRUGS (NSAID'S)  Using products such as Advil (ibuprofen), Aleve (naproxen), Motrin (ibuprofen) for additional pain control during fracture healing can delay and/or prevent the healing response.  If you would like to take over the counter (OTC) medication, Tylenol (acetaminophen) is ok.  However, some narcotic medications that are given for pain control contain acetaminophen as well. Therefore, you should not exceed more than 4000 mg of tylenol in a day if you do not have liver disease.  Also note that there  are may OTC medicines, such as cold medicines and allergy medicines that my contain tylenol as well.  If you have any questions about medications and/or interactions please ask your doctor/PA or your pharmacist.      ICE AND ELEVATE  INJURED/OPERATIVE EXTREMITY  Using ice and elevating the injured extremity above your heart can help with swelling and pain control.  Icing in a pulsatile fashion, such as 20 minutes on and 20 minutes off, can be followed.    Do not place ice directly on skin. Make sure there is a barrier between to skin and the ice pack.    Using frozen items such as frozen peas works well as the conform nicely to the are that needs to be iced.  USE AN ACE WRAP OR TED HOSE FOR SWELLING CONTROL  In addition to icing and elevation, Ace wraps or TED hose are used to help limit and resolve swelling.  It is recommended to use Ace wraps or TED hose until you are informed to stop.    When using Ace Wraps start the wrapping distally (farthest away from the body) and wrap proximally (closer to the body)   Example: If you had surgery on your leg or thing and you do not have a splint on, start the ace wrap at the toes and work your way up to the thigh        If you had surgery on your upper extremity and do not have a splint on, start the ace wrap at your fingers and work your way up to the upper arm  IF YOU ARE IN A SPLINT OR CAST DO NOT [place]   If your splint gets wet for any reason please contact the office immediately. You may shower in your splint or cast as long as you keep it dry.  This can be done by wrapping in a cast cover or garbage back (or similar)  Do Not stick any thing down your splint or cast such as pencils, money, or hangers to try and scratch yourself with.  If you feel itchy take benadryl as prescribed on the bottle for itching  IF YOU ARE IN A CAM BOOT (BLACK BOOT)  You may remove boot periodically. Perform daily dressing changes as noted below.  Wash the liner of the boot regularly and wear a sock when wearing the boot. It is recommended that you sleep in the boot until told otherwise    Call office for the following:  Temperature greater than 101F  Persistent nausea and  vomiting  Severe uncontrolled pain  Redness, tenderness, or signs of infection (pain, swelling, redness, odor or green/yellow discharge around the site)  Difficulty breathing, headache or visual disturbances  Hives  Persistent dizziness or light-headedness  Extreme fatigue  Any other questions or concerns you may have after discharge  In an emergency, call 911 or go to an Emergency Department at a nearby hospital    [place]: [phone]   VISIT OUR WEBSITE FOR ADDITIONAL INFORMATION: [email or web address]

## 2019-10-01 NOTE — Anesthesia Procedure Notes (Signed)
Procedure Name: LMA Insertion Performed by: Jaszmine Navejas B, CRNA Pre-anesthesia Checklist: Patient identified, Emergency Drugs available, Suction available and Patient being monitored Patient Re-evaluated:Patient Re-evaluated prior to induction Oxygen Delivery Method: Circle System Utilized Preoxygenation: Pre-oxygenation with 100% oxygen Induction Type: IV induction Ventilation: Mask ventilation without difficulty LMA: LMA inserted LMA Size: 4.0 Number of attempts: 1 Airway Equipment and Method: Bite block Placement Confirmation: positive ETCO2 Tube secured with: Tape Dental Injury: Teeth and Oropharynx as per pre-operative assessment        

## 2019-10-01 NOTE — Progress Notes (Signed)
50 mcg of fentanyl wasted in sharps with Jake Bathe, RN.

## 2019-10-01 NOTE — Transfer of Care (Signed)
Immediate Anesthesia Transfer of Care Note  Patient: Angel Irwin  Procedure(s) Performed: HARDWARE REMOVAL TIBIA (Left Leg Lower)  Patient Location: PACU  Anesthesia Type:General  Level of Consciousness: drowsy  Airway & Oxygen Therapy: Patient Spontanous Breathing and Patient connected to face mask oxygen  Post-op Assessment: Report given to RN and Post -op Vital signs reviewed and stable  Post vital signs: Reviewed and stable  Last Vitals:  Vitals Value Taken Time  BP 105/56 10/01/19 1223  Temp    Pulse 75 10/01/19 1225  Resp 14 10/01/19 1225  SpO2 99 % 10/01/19 1225  Vitals shown include unvalidated device data.  Last Pain:  Vitals:   10/01/19 0707  TempSrc:   PainSc: 0-No pain         Complications: No complications documented.

## 2019-10-01 NOTE — Anesthesia Postprocedure Evaluation (Signed)
Anesthesia Post Note  Patient: Angel Irwin  Procedure(s) Performed: HARDWARE REMOVAL TIBIA (Left Leg Lower)     Patient location during evaluation: PACU Anesthesia Type: General Level of consciousness: awake and alert Pain management: pain level controlled Vital Signs Assessment: post-procedure vital signs reviewed and stable Respiratory status: spontaneous breathing, nonlabored ventilation, respiratory function stable and patient connected to nasal cannula oxygen Cardiovascular status: blood pressure returned to baseline and stable Postop Assessment: no apparent nausea or vomiting Anesthetic complications: no   No complications documented.  Last Vitals:  Vitals:   10/01/19 1309 10/01/19 1323  BP: 108/66 104/65  Pulse: (!) 47 (!) 52  Resp: 11 12  Temp: 36.9 C   SpO2: 92% 100%    Last Pain:  Vitals:   10/01/19 1323  TempSrc:   PainSc: 0-No pain                 Ayven Pheasant

## 2019-10-02 ENCOUNTER — Encounter (HOSPITAL_COMMUNITY): Payer: Self-pay | Admitting: Orthopedic Surgery

## 2019-10-08 DIAGNOSIS — T84418D Breakdown (mechanical) of other internal orthopedic devices, implants and grafts, subsequent encounter: Secondary | ICD-10-CM | POA: Diagnosis not present

## 2019-10-08 DIAGNOSIS — T8484XD Pain due to internal orthopedic prosthetic devices, implants and grafts, subsequent encounter: Secondary | ICD-10-CM | POA: Diagnosis not present

## 2019-10-27 NOTE — Op Note (Signed)
10/01/2019  10:27 AM  PATIENT:  Angel Irwin  69 y.o. female  PRE-OPERATIVE DIAGNOSIS:  SYMPTOMATIC HARDWARE LEFT TIBIAL PLATEAU  POST-OPERATIVE DIAGNOSIS:   1. SYMPTOMATIC HARDWARE LEFT TIBIAL PLATEAU 2. BROKEN HARDWARE 3. STABLE KNEE, POST TRAUMATIC ARTHRITIS  PROCEDURE:  Procedure(s): 1. HARDWARE REMOVAL TIBIA (Left) INCLUDING BROKEN SCREW 2. MANUAL APPLICATION OF STRESS UNDER FLUOROSCOPY  SURGEON:  Surgeon(s) and Role:    Altamese [place], MD - Primary  PHYSICIAN ASSISTANT: Ainsley Spinner, PA-C  ANESTHESIA:   general  I/O:  No intake/output data recorded.  SPECIMEN:  No Specimen  TOURNIQUET:  * No tourniquets in log *  COMPLICATIONS: NONE  DICTATION: .Note written in EPIC  DISPOSITION: TO PACU  CONDITION: STABLE  DELAY START OF DVT PROPHYLAXIS BECAUSE OF BLEEDING RISK: NO   BRIEF SUMMARY OF INDICATION FOR PROCEDURE:  Patient is a pleasant 69 y.o. who underwent plate fixation of a fracture with subsequent healing. Despite conservative measures, hardware related symptoms have persisted. Therefore, I discussed with the patient the risks and benefits of surgical removal including infection, nerve or vessel injury, failure to alleviate symptoms, occult nonunion, re-fracture, DVT, PE, and multiple others. They did wish to proceed.  BRIEF SUMMARY OF PROCEDURE:  The patient was taken to the operating room after administration of 2 g of Ancef.  General anesthesia was induced. The left lower extremity was prepped and draped in usual sterile fashion.  No tourniquet was used during the procedure. I remade the old incision and dissected sharply down to the plate, elevating the soft tissues. I identified and removed all screws except for one with a broken head. With x-ray confirmation, I then remade the proximal incision. The near side of the broken screw was better exposed with a trephine and then extracted using the easy out system. Final x-rays confirmed removal of all  hardware and a healed fracture. Given her substantial post traumatic arthritic change we then applied manual stress to the knee under fluoro to see if there was subsidence into the lateral plateau or medial ligamentous laxity but the knee was found to be stable. The wounds were irrigated thoroughly and closed in standard fashion with vicryl and nylon. A sterile gently compressive dressing was applied.  The patient was taken to the PACU in stable condition.  Ainsley Spinner, PA-C, assisted me throughout.  PROGNOSIS: Patient will be weightbearing as tolerated with aggressive active and passive motion of the knee and ankle. Bleeding would be anticipated. She may change or remove his dressing in 48 hours and shower. Patient will follow up in 10 days for removal of sutures.    [name]. Marcelino Scot, M.D.

## 2020-05-28 ENCOUNTER — Emergency Department (HOSPITAL_COMMUNITY): Payer: Medicare Other

## 2020-05-28 ENCOUNTER — Emergency Department (HOSPITAL_COMMUNITY)
Admission: EM | Admit: 2020-05-28 | Discharge: 2020-05-28 | Disposition: A | Discharge status: Home / Self Care | Payer: Medicare Other | Attending: Emergency Medicine | Admitting: Emergency Medicine

## 2020-05-28 ENCOUNTER — Encounter (HOSPITAL_COMMUNITY): Payer: Self-pay

## 2020-05-28 DIAGNOSIS — S7002XA Contusion of left hip, initial encounter: Secondary | ICD-10-CM | POA: Diagnosis not present

## 2020-05-28 DIAGNOSIS — S79912A Unspecified injury of left hip, initial encounter: Secondary | ICD-10-CM | POA: Diagnosis present

## 2020-05-28 DIAGNOSIS — W000XXA Fall on same level due to ice and snow, initial encounter: Secondary | ICD-10-CM | POA: Insufficient documentation

## 2020-05-28 DIAGNOSIS — J45909 Unspecified asthma, uncomplicated: Secondary | ICD-10-CM | POA: Diagnosis not present

## 2020-05-28 DIAGNOSIS — R0781 Pleurodynia: Secondary | ICD-10-CM | POA: Diagnosis not present

## 2020-05-28 DIAGNOSIS — K449 Diaphragmatic hernia without obstruction or gangrene: Secondary | ICD-10-CM | POA: Diagnosis not present

## 2020-05-28 MED ORDER — CYCLOBENZAPRINE HCL 10 MG PO TABS
10.0000 mg | ORAL_TABLET | Freq: Two times a day (BID) | ORAL | 0 refills | Status: DC | PRN
Start: 2020-05-28 — End: 2020-06-06

## 2020-05-28 NOTE — ED Provider Notes (Signed)
[facility] EMERGENCY DEPARTMENT Provider Note   CSN: [id number] Arrival date & time: 05/28/20  0732     History Chief Complaint  Patient presents with  . Fall  . Back Pain    Angel Irwin is a 70 y.o. female.  HPI   Patient with no significant medical history presents emergency department chief complaint of left-sided rib pain.  Patient endorses on Monday she slipped on some ice causing her to fall backwards onto some steps.  She endorses her left rib hit the corner of the stairs.  She denies hitting her head, losing conscious, is not on anticoags.  She denies  neck or back pain, denies difficulty with breathing, pleuritic chest pain, abdominal pain, hematchezia  hematuria, hematemesis.  Patient has been taking Norco at nighttime which seems to help with her pain as well as ibuprofen Tylenol.  Patient denies headaches, fevers, chest pain, shortness of breath, abdominal pain, nausea, vomiting, diarrhea, pedal edema.  Past Medical History:  Diagnosis Date  . Adenomatous colon polyp 09/04/2014  . Asthma    albuterol-rescue inhaler-uses 1-2 x year  . Bone disease, metabolic 6/64/4034  . [name] lesion, chronic    on sm bowel capsule 06/2014  . Closed fracture of lateral portion of left tibial plateau 07/23/2016  . Complication of anesthesia    slow to awaken  . Fundic gland polyps of stomach, benign   . GERD (gastroesophageal reflux disease)    no meds  . Hiatal hernia   . History of kidney stones 09/2017  . 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 Iron deficiency anemia due to chronic blood loss 07/23/2014  . Vitamin D deficiency 08/26/2016    Patient Active Problem List   Diagnosis Date Noted  . Osteoporosis 09/13/2018  . Hemangioma of liver 03/09/2018  . Vitamin D deficiency 08/26/2016  . Fall 08/26/2016  . Hiatal hernia 09/04/2014  . 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 Iron deficiency anemia due to chronic  blood loss 07/23/2014  . Asthma, chronic 04/08/2014    Past Surgical History:  Procedure Laterality Date  . ANTERIOR AND POSTERIOR REPAIR  02/22/2011   Procedure: ANTERIOR (CYSTOCELE) AND POSTERIOR REPAIR (RECTOCELE);  Surgeon: Delice Lesch, MD;  Location: [facility];  Service: Gynecology;  Laterality: N/A;  . COLONOSCOPY    . CYSTOSCOPY  02/22/2011   Procedure: CYSTOSCOPY;  Surgeon: Delice Lesch, MD;  Location: [facility];  Service: Gynecology;  Laterality: N/A;  . DILATION AND CURETTAGE OF UTERUS  1980  . EXTERNAL FIXATION LEG Left 07/24/2016   Procedure: EXTERNAL FIXATION LEFT LOWER LEG;  Surgeon: Rod Can, MD;  Location: [facility];  Service: Orthopedics;  Laterality: Left;  . HARDWARE REMOVAL Left 10/01/2019   Procedure: HARDWARE REMOVAL TIBIA;  Surgeon: Altamese Martinsdale, MD;  Location: [facility];  Service: Orthopedics;  Laterality: Left;  . ORIF TIBIA PLATEAU Left 07/27/2016   Procedure: OPEN REDUCTION INTERNAL FIXATION (ORIF) TIBIAL PLATEAU;  Surgeon: Altamese Bushnell, MD;  Location: [facility];  Service: Orthopedics;  Laterality: Left;  . SALPINGOOPHORECTOMY  02/22/2011   Procedure: SALPINGO OOPHERECTOMY;  Surgeon: Delice Lesch, MD;  Location: [facility];  Service: Gynecology;  Laterality: Bilateral;  . VAGINAL HYSTERECTOMY  02/22/2011   Procedure: HYSTERECTOMY VAGINAL;  Surgeon: Delice Lesch, MD;  Location: [facility];  Service: Gynecology;  Laterality: N/A;  . WISDOM TOOTH EXTRACTION       OB History   No obstetric history on  file.     Family History  Problem Relation Age of Onset  . Diabetes Father   . Lung cancer Sister 21  . Diabetes Mellitus I Sister   . Heart disease Maternal Grandmother   . Diabetes Maternal Grandmother   . Heart disease Maternal Grandfather   . Kidney disease Maternal Grandfather   . Other Mother 65       murdered  . Other Brother 34       murdered  . Diabetes Paternal Grandmother   . Brain cancer Paternal Grandfather   . Diabetes Sister   . Colon cancer Neg  Hx   . Colon polyps Neg Hx   . Esophageal cancer Neg Hx   . Gallbladder disease Neg Hx   . Breast cancer Neg Hx     Social History   Tobacco Use  . Smoking status: Never Smoker  . Smokeless tobacco: Never Used  Vaping Use  . Vaping Use: Never used  Substance Use Topics  . Alcohol use: Yes    Alcohol/week: 0.0 standard drinks    Comment: Rarely  . Drug use: No    Home Medications Prior to Admission medications   Medication Sig Start Date End Date Taking? Authorizing Provider  cyclobenzaprine (FLEXERIL) 10 MG tablet Take 1 tablet (10 mg total) by mouth 2 (two) times daily as needed for muscle spasms. 05/28/20  Yes Marcello Fennel, PA-C  acetaminophen (TYLENOL) 500 MG tablet Take 1 tablet (500 mg total) by mouth every 12 (twelve) hours. 10/01/19   Ainsley Spinner, PA-C  albuterol (PROVENTIL HFA;VENTOLIN HFA) 108 (90 Base) MCG/ACT inhaler Inhale 2 puffs into the lungs every 6 (six) hours as needed for wheezing or shortness of breath. 08/02/17   Lucretia Kern, DO  HYDROcodone-acetaminophen (NORCO) 5-325 MG tablet Take 1-2 tablets by mouth every 8 (eight) hours as needed for moderate pain or severe pain. 10/01/19   Ainsley Spinner, PA-C  ondansetron [facility]) 4 MG/5ML solution Take 5 mLs (4 mg total) by mouth every 8 (eight) hours as needed for nausea or vomiting. 10/01/19   Ainsley Spinner, PA-C  VITAMIN D PO Take 6,000 Units by mouth daily. With K2 100 mcg    [provider]    Allergies    No known allergies  Review of Systems   Review of Systems  Constitutional: Negative for chills and fever.  HENT: Negative for congestion.   Respiratory: Negative for shortness of breath.   Cardiovascular: Negative for chest pain and palpitations.  Gastrointestinal: Negative for abdominal pain, blood in stool, diarrhea, nausea and vomiting.  Genitourinary: Negative for enuresis and hematuria.  Musculoskeletal: Negative for back pain.       Left-sided rib pain.  Skin: Negative for rash.   Neurological: Negative for dizziness and headaches.  Hematological: Does not bruise/bleed easily.    Physical Exam Updated Vital Signs BP 107/89 (BP Location: Right Arm)   Pulse 72   Temp 97.6 F (36.4 C)   Resp 20   Ht 5\' 1"  (1.549 m)   Wt 77.1 kg   SpO2 99%   BMI 32.12 kg/m   Physical Exam Vitals and nursing note reviewed.  Constitutional:      General: She is not in acute distress.    Appearance: She is not ill-appearing.  HENT:     Head: Normocephalic and atraumatic.     Nose: No congestion.  Eyes:     Conjunctiva/sclera: Conjunctivae normal.  Cardiovascular:     Rate and Rhythm: Normal rate  and regular rhythm.     Pulses: Normal pulses.     Heart sounds: No murmur heard. No friction rub. No gallop.   Pulmonary:     Effort: No respiratory distress.     Breath sounds: No wheezing, rhonchi or rales.  Abdominal:     Palpations: Abdomen is soft.     Tenderness: There is no abdominal tenderness.  Musculoskeletal:     Cervical back: Normal range of motion. No tenderness.     Right lower leg: No edema.     Left lower leg: No edema.     Comments: Patient is moving all 4 extremities without difficulty.  Patient spine was palpated nontender to palpation, no step-off or deformities present.  Patient's chest was visualized, there is no acute abnormalities present, no flail chest  noted, she was tender to palpation along her seventh and eighth rib posteriorly, no deformities or crepitus present.  Skin:    General: Skin is warm and dry.     Findings: Bruising present.     Comments: Patient is a small noted bruise above her left iliac crest  Neurological:     Mental Status: She is alert.     Comments: Patient having no difficulty with word finding.  Psychiatric:        Mood and Affect: Mood normal.     ED Results / Procedures / Treatments   Labs (all labs ordered are listed, but only abnormal results are displayed) Labs Reviewed - No data to  display  EKG None  Radiology DG Ribs Unilateral W/Chest Left  Result Date: 05/28/2020 CLINICAL DATA:  70 year old female status post fall on ice 2 days ago. Persistent left lower rib pain. EXAM: LEFT RIBS AND CHEST - 3+ VIEW COMPARISON:  Chest radiographs 07/23/2016. FINDINGS: Large chronic gastric hiatal hernia, with gastric air-fluid level today. Other mediastinal contours are within normal limits. Visualized tracheal air column is within normal limits. The right lung appears clear. No left pneumothorax identified, although new blunting of the left costophrenic angle could reflect atelectasis and/or small effusion. Elsewhere the left lung appears clear. Oblique views of the left ribs. On some oblique views there is limited lower rib detail due to attenuation from body habitus. The rib marker is placed at the posterolateral left 9/10 rib level. No left rib fracture is identified. Negative visible bowel gas pattern. Other visible osseous structures appear intact. IMPRESSION: 1. New atelectasis and/or small pleural effusion at the left lung base suspicious for a nondisplaced left lower rib fracture, 9th/10th rib is marked area of concern. No fracture is identified radiographically. 2. But no pneumothorax or other acute cardiopulmonary abnormality. Chronic large gastric hiatal hernia. Electronically Signed   By: Genevie Ann M.D.   On: 05/28/2020 10:41    Procedures Procedures   Medications Ordered in ED Medications - No data to display  ED Course  I have reviewed the triage vital signs and the nursing notes.  Pertinent labs & imaging results that were available during my care of the patient were reviewed by me and considered in my medical decision making (see chart for details).    MDM Rules/Calculators/A&P                          Initial impression-patient presents with left rib pain.  She is alert, does not appear in acute distress, vital signs reassuring.  Concern for pneumothorax, rib  fracture will obtain x-ray for further evaluation.  Work-up-x-ray shows  new atelectasis and/or small pleural effusion in the left lung base suspicious for nondisplaced left lower rib fracture ninth and 10th, no actual fracture identified.  No pneumothorax or other acute abnormalities present.  Rule out-I have low suspicion for intracranial head bleed as patient has headaches, change in vision, paresthesias, weakness, no neuro deficits on my exam.  Low suspicion for pneumothorax as lung sounds are clear bilaterally, x-ray negative for acute findings.  Low suspicion for splenic rupture as abdomen is soft nontender to palpation, no peritoneal sign noted, patient is hemodynamically stable after 3 days.  Plan-suspect patient may have a rib fracture of the left side, will provide patient with pain medications, incentive spirometry and have her follow-up with PCP in 3 weeks time for reevaluation.  Vital signs have remained stable, no indication for hospital admission.  Patient discussed with attending and they agreed with assessment and plan.  Patient given at home care as well strict return precautions.  Patient verbalized that they understood agreed to said plan.   Final Clinical Impression(s) / ED Diagnoses Final diagnoses:  Rib pain    Rx / DC Orders ED Discharge Orders         Ordered    cyclobenzaprine (FLEXERIL) 10 MG tablet  2 times daily PRN        05/28/20 1103           Aron Baba 05/28/20 1109    Elnora Morrison, MD 05/30/20 1148

## 2020-05-28 NOTE — ED Triage Notes (Signed)
Pt reports mechanical fall on Monday, hit her left flank area on a step, denies hitting her head or LOC. Pt is not on a blood thinner, small bruise noted to flank, no other injuries from fall.  Pt a.o, ambulatory.

## 2020-05-28 NOTE — Discharge Instructions (Addendum)
You been seen here after a fall.  Imaging looks reassuring.  I have given you a prescription for muscle relaxers please take as prescribed.  Please be aware this medication can make you drowsy do not consume alcohol or operate heavy machinery while taking this medication.  You may continue take over-the-counter pain medications like ibuprofen as needed for pain.  Please follow dosing the back of bottle.     I have given you a incentive spirometer please use 3 times a day as this will help prevent pneumonia.  Please follow-up with your PCP in 3 weeks time for reevaluation.  Come back to the emergency department if you develop chest pain, shortness of breath, severe abdominal pain, uncontrolled nausea, vomiting, diarrhea.

## 2020-06-05 ENCOUNTER — Other Ambulatory Visit: Payer: Self-pay

## 2020-06-06 ENCOUNTER — Encounter: Payer: Self-pay | Admitting: Family Medicine

## 2020-06-06 ENCOUNTER — Ambulatory Visit (INDEPENDENT_AMBULATORY_CARE_PROVIDER_SITE_OTHER): Payer: Medicare Other | Admitting: Family Medicine

## 2020-06-06 VITALS — BP 102/80 | HR 73 | Temp 97.7°F | Ht 61.0 in | Wt 172.4 lb

## 2020-06-06 DIAGNOSIS — Z131 Encounter for screening for diabetes mellitus: Secondary | ICD-10-CM | POA: Diagnosis not present

## 2020-06-06 DIAGNOSIS — Z1231 Encounter for screening mammogram for malignant neoplasm of breast: Secondary | ICD-10-CM

## 2020-06-06 DIAGNOSIS — D500 Iron deficiency anemia secondary to blood loss (chronic): Secondary | ICD-10-CM

## 2020-06-06 DIAGNOSIS — J4520 Mild intermittent asthma, uncomplicated: Secondary | ICD-10-CM | POA: Diagnosis not present

## 2020-06-06 DIAGNOSIS — E559 Vitamin D deficiency, unspecified: Secondary | ICD-10-CM

## 2020-06-06 DIAGNOSIS — Z23 Encounter for immunization: Secondary | ICD-10-CM

## 2020-06-06 DIAGNOSIS — M810 Age-related osteoporosis without current pathological fracture: Secondary | ICD-10-CM

## 2020-06-06 DIAGNOSIS — Z0000 Encounter for general adult medical examination without abnormal findings: Secondary | ICD-10-CM

## 2020-06-06 DIAGNOSIS — Z13220 Encounter for screening for lipoid disorders: Secondary | ICD-10-CM | POA: Diagnosis not present

## 2020-06-06 MED ORDER — ALBUTEROL SULFATE HFA 108 (90 BASE) MCG/ACT IN AERS: 2.0000 | INHALATION_SPRAY | Freq: Four times a day (QID) | RESPIRATORY_TRACT | 3 refills | Status: DC | PRN

## 2020-06-06 MED ORDER — SHINGRIX 50 MCG/0.5ML IM SUSR
0.5000 mL | Freq: Once | INTRAMUSCULAR | 0 refills | Status: AC
Start: 2020-06-06 — End: 2020-06-06

## 2020-06-06 NOTE — Progress Notes (Signed)
Angel Irwin DOB: October 16, 1950 Encounter date: 06/06/2020  This is a 71 y.o. female who presents for complete physical   History of present illness/Additional concerns:  She has scheduled colonoscopy; had to reschedule this.  She is also due for mammogram.  Past Medical History:  Diagnosis Date  . Adenomatous colon polyp 09/04/2014  . Asthma    albuterol-rescue inhaler-uses 1-2 x year  . Bone disease, metabolic 9/37/1696  . [name] lesion, chronic    on sm bowel capsule 06/2014  . Closed fracture of lateral portion of left tibial plateau 07/23/2016  . Complication of anesthesia    slow to awaken  . Fundic gland polyps of stomach, benign   . GERD (gastroesophageal reflux disease)    no meds  . Hiatal hernia   . History of kidney stones 09/2017  . 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 Iron deficiency anemia due to chronic blood loss 07/23/2014  . Vitamin D deficiency 08/26/2016   Past Surgical History:  Procedure Laterality Date  . ANTERIOR AND POSTERIOR REPAIR  02/22/2011   Procedure: ANTERIOR (CYSTOCELE) AND POSTERIOR REPAIR (RECTOCELE);  Surgeon: Delice Lesch, MD;  Location: [facility];  Service: Gynecology;  Laterality: N/A;  . COLONOSCOPY    . CYSTOSCOPY  02/22/2011   Procedure: CYSTOSCOPY;  Surgeon: Delice Lesch, MD;  Location: [facility];  Service: Gynecology;  Laterality: N/A;  . DILATION AND CURETTAGE OF UTERUS  1980  . EXTERNAL FIXATION LEG Left 07/24/2016   Procedure: EXTERNAL FIXATION LEFT LOWER LEG;  Surgeon: Rod Can, MD;  Location: [facility];  Service: Orthopedics;  Laterality: Left;  . HARDWARE REMOVAL Left 10/01/2019   Procedure: HARDWARE REMOVAL TIBIA;  Surgeon: Altamese Calvert Beach, MD;  Location: [facility];  Service: Orthopedics;  Laterality: Left;  . ORIF TIBIA PLATEAU Left 07/27/2016   Procedure: OPEN REDUCTION INTERNAL FIXATION (ORIF) TIBIAL PLATEAU;  Surgeon: Altamese Dooling, MD;  Location: [facility];  Service: Orthopedics;   Laterality: Left;  . SALPINGOOPHORECTOMY  02/22/2011   Procedure: SALPINGO OOPHERECTOMY;  Surgeon: Delice Lesch, MD;  Location: [facility];  Service: Gynecology;  Laterality: Bilateral;  . VAGINAL HYSTERECTOMY  02/22/2011   Procedure: HYSTERECTOMY VAGINAL;  Surgeon: Delice Lesch, MD;  Location: [facility];  Service: Gynecology;  Laterality: N/A;  . WISDOM TOOTH EXTRACTION     Allergies  Allergen Reactions  . No Known Allergies    Current Meds  Medication Sig  . VITAMIN D PO Take 6,000 Units by mouth daily. With K2 100 mcg  . [EXPIRED] Zoster Vaccine Adjuvanted [facility]) injection Inject 0.5 mLs into the muscle once for 1 dose. Repeat in 2-6 months  . [DISCONTINUED] albuterol (PROVENTIL HFA;VENTOLIN HFA) 108 (90 Base) MCG/ACT inhaler Inhale 2 puffs into the lungs every 6 (six) hours as needed for wheezing or shortness of breath.   Social History   Tobacco Use  . Smoking status: Never Smoker  . Smokeless tobacco: Never Used  Substance Use Topics  . Alcohol use: Yes    Alcohol/week: 0.0 standard drinks    Comment: Rarely   Family History  Problem Relation Age of Onset  . Diabetes Father   . Lung cancer Sister 25  . Diabetes Mellitus I Sister   . Heart disease Maternal Grandmother   . Diabetes Maternal Grandmother   . Heart disease Maternal Grandfather   . Kidney disease Maternal Grandfather   . Other Mother 69       murdered  .  Other Brother 49       murdered  . Diabetes Paternal Grandmother   . Brain cancer Paternal Grandfather   . Diabetes Sister   . Colon cancer Neg Hx   . Colon polyps Neg Hx   . Esophageal cancer Neg Hx   . Gallbladder disease Neg Hx   . Breast cancer Neg Hx      Review of Systems  Constitutional: Negative for activity change, appetite change, chills, fatigue, fever and unexpected weight change.  HENT: Negative for congestion, ear pain, hearing loss, sinus pressure, sinus pain, sore throat and trouble swallowing.   Eyes: Negative for pain and  visual disturbance.  Respiratory: Negative for cough, chest tightness, shortness of breath and wheezing.   Cardiovascular: Negative for chest pain, palpitations and leg swelling.  Gastrointestinal: Negative for abdominal pain, blood in stool, constipation, diarrhea, nausea and vomiting.  Genitourinary: Negative for difficulty urinating and menstrual problem.  Musculoskeletal: Negative for arthralgias and back pain.  Skin: Negative for rash.  Neurological: Negative for dizziness, weakness, numbness and headaches.  Hematological: Negative for adenopathy. Does not bruise/bleed easily.  Psychiatric/Behavioral: Negative for sleep disturbance and suicidal ideas. The patient is not nervous/anxious.     CBC:  Lab Results  Component Value Date   WBC 9.3 06/06/2020   HGB 14.6 06/06/2020   HGB 13.4 03/21/2015   HCT 43.2 06/06/2020   HCT 40.4 03/21/2015   MCH 30.6 06/06/2020   MCHC 33.8 06/06/2020   RDW 12.1 06/06/2020   RDW 13.6 03/21/2015   PLT 293 06/06/2020   PLT 262 03/21/2015   MPV 9.6 06/06/2020   CMP: Lab Results  Component Value Date   NA 136 06/06/2020   NA 137 07/23/2014   K 4.3 06/06/2020   K 4.2 07/23/2014   CL 102 06/06/2020   CO2 22 06/06/2020   CO2 20 (L) 07/23/2014   ANIONGAP 11 09/28/2017   GLUCOSE 85 06/06/2020   GLUCOSE 93 07/23/2014   BUN 19 06/06/2020   BUN 13.8 07/23/2014   CREATININE 0.72 06/06/2020   CREATININE 0.8 07/23/2014   GFRAA >60 09/28/2017   CALCIUM 10.6 (H) 06/06/2020   CALCIUM 9.4 07/27/2016   CALCIUM 9.5 07/23/2014   PROT 7.3 06/06/2020   PROT 7.4 07/23/2014   BILITOT 0.8 06/06/2020   BILITOT 0.39 07/23/2014   ALKPHOS 96 05/11/2019   ALKPHOS 107 07/23/2014   ALT 20 06/06/2020   ALT 14 07/23/2014   AST 17 06/06/2020   AST 18 07/23/2014   LIPID: Lab Results  Component Value Date   CHOL 208 (H) 06/06/2020   TRIG 160 (H) 06/06/2020   HDL 41 (L) 06/06/2020   LDLCALC 137 (H) 06/06/2020    Objective:  BP 102/80 (BP Location:  Left Arm, Patient Position: Sitting, Cuff Size: Normal)   Pulse 73   Temp 97.7 F (36.5 C) (Oral)   Ht 5\' 1"  (1.549 m)   Wt 172 lb 6.4 oz (78.2 kg)   SpO2 97%   BMI 32.57 kg/m   Weight: 172 lb 6.4 oz (78.2 kg)   BP Readings from Last 3 Encounters:  06/06/20 102/80  05/28/20 114/70  10/01/19 104/65   Wt Readings from Last 3 Encounters:  06/06/20 172 lb 6.4 oz (78.2 kg)  05/28/20 170 lb (77.1 kg)  10/01/19 169 lb (76.7 kg)    Physical Exam Constitutional:      General: She is not in acute distress.    Appearance: She is well-developed and well-nourished.  HENT:  Head: Normocephalic and atraumatic.     Right Ear: External ear normal.     Left Ear: External ear normal.     Mouth/Throat:     Mouth: Oropharynx is clear and moist.     Pharynx: No oropharyngeal exudate.  Eyes:     Conjunctiva/sclera: Conjunctivae normal.     Pupils: Pupils are equal, round, and reactive to light.  Neck:     Thyroid: No thyromegaly.  Cardiovascular:     Rate and Rhythm: Normal rate and regular rhythm.     Heart sounds: Normal heart sounds. No murmur heard. No friction rub. No gallop.   Pulmonary:     Effort: Pulmonary effort is normal.     Breath sounds: Normal breath sounds.  Abdominal:     General: Bowel sounds are normal. There is no distension.     Palpations: Abdomen is soft. There is no mass.     Tenderness: There is no abdominal tenderness. There is no guarding.     Hernia: No hernia is present.  Musculoskeletal:        General: No tenderness, deformity or edema. Normal range of motion.     Cervical back: Normal range of motion and neck supple.  Lymphadenopathy:     Cervical: No cervical adenopathy.  Skin:    General: Skin is warm and dry.     Findings: No rash.  Neurological:     Mental Status: She is alert and oriented to person, place, and time.     Deep Tendon Reflexes: Strength normal. Reflexes normal.     Reflex Scores:      Tricep reflexes are 2+ on the right side  and 2+ on the left side.      Bicep reflexes are 2+ on the right side and 2+ on the left side.      Brachioradialis reflexes are 2+ on the right side and 2+ on the left side.      Patellar reflexes are 2+ on the right side and 2+ on the left side. Psychiatric:        Mood and Affect: Mood and affect normal.        Speech: Speech normal.        Behavior: Behavior normal.        Thought Content: Thought content normal.     Assessment/Plan: Health Maintenance Due  Topic Date Due  . COVID-19 Vaccine (3 - Booster for Pfizer series) 12/19/2019   Health Maintenance reviewed.  1. Preventative health care Keep up with regular exercise, healthy lifestyle.  2. Mild intermittent chronic asthma without complication Stable.  - albuterol (VENTOLIN HFA) 108 (90 Base) MCG/ACT inhaler; Inhale 2 puffs into the lungs every 6 (six) hours as needed for wheezing or shortness of breath.  Dispense: 2 each; Refill: 3  3. Osteoporosis, unspecified osteoporosis type, unspecified pathological fracture presence Taking vitamin D, weight bearing exercise. Will recheck dexa. (has declined medication for treatment in the past) - DG Bone Density; Future  4. Vitamin D deficiency - VITAMIN D 25 Hydroxy (Vit-D Deficiency, Fractures); Future - VITAMIN D 25 Hydroxy (Vit-D Deficiency, Fractures)  5. Iron deficiency anemia due to chronic blood loss - CBC with Differential/Platelet; Future - Iron, TIBC and Ferritin Panel; Future - Iron, TIBC and Ferritin Panel - CBC with Differential/Platelet  6. Encounter for screening mammogram for malignant neoplasm of breast - MM DIGITAL SCREENING BILATERAL; Future  7. Lipid screening - Lipid panel; Future - Lipid panel  8. Screening for diabetes mellitus -  Comprehensive metabolic panel; Future - Comprehensive metabolic panel  9. Need for immunization against influenza - Flu Vaccine QUAD High Dose(Fluad)  Return in about 1 year (around 06/06/2021) for physical  exam.  Micheline Rough, MD

## 2020-06-07 LAB — CBC WITH DIFFERENTIAL/PLATELET
Absolute Monocytes: 567 cells/uL (ref 200–950)
Basophils Absolute: 56 cells/uL (ref 0–200)
Basophils Relative: 0.6 %
Eosinophils Absolute: 167 cells/uL (ref 15–500)
Eosinophils Relative: 1.8 %
HCT: 43.2 % (ref 35.0–45.0)
Hemoglobin: 14.6 g/dL (ref 11.7–15.5)
Lymphs Abs: 2334 cells/uL (ref 850–3900)
MCH: 30.6 pg (ref 27.0–33.0)
MCHC: 33.8 g/dL (ref 32.0–36.0)
MCV: 90.6 fL (ref 80.0–100.0)
MPV: 9.6 fL (ref 7.5–12.5)
Monocytes Relative: 6.1 %
Neutro Abs: 6175 cells/uL (ref 1500–7800)
Neutrophils Relative %: 66.4 %
Platelets: 293 10*3/uL (ref 140–400)
RBC: 4.77 10*6/uL (ref 3.80–5.10)
RDW: 12.1 % (ref 11.0–15.0)
Total Lymphocyte: 25.1 %
WBC: 9.3 10*3/uL (ref 3.8–10.8)

## 2020-06-07 LAB — COMPREHENSIVE METABOLIC PANEL
AG Ratio: 1.4 (calc) (ref 1.0–2.5)
ALT: 20 U/L (ref 6–29)
AST: 17 U/L (ref 10–35)
Albumin: 4.2 g/dL (ref 3.6–5.1)
Alkaline phosphatase (APISO): 107 U/L (ref 37–153)
BUN: 19 mg/dL (ref 7–25)
CO2: 22 mmol/L (ref 20–32)
Calcium: 10.6 mg/dL — ABNORMAL HIGH (ref 8.6–10.4)
Chloride: 102 mmol/L (ref 98–110)
Creat: 0.72 mg/dL (ref 0.50–0.99)
Globulin: 3.1 g/dL (calc) (ref 1.9–3.7)
Glucose, Bld: 85 mg/dL (ref 65–99)
Potassium: 4.3 mmol/L (ref 3.5–5.3)
Sodium: 136 mmol/L (ref 135–146)
Total Bilirubin: 0.8 mg/dL (ref 0.2–1.2)
Total Protein: 7.3 g/dL (ref 6.1–8.1)

## 2020-06-07 LAB — LIPID PANEL
Cholesterol: 208 mg/dL — ABNORMAL HIGH (ref <200)
HDL: 41 mg/dL — ABNORMAL LOW (ref >=50)
LDL Cholesterol (Calc): 137 mg/dL (calc) — ABNORMAL HIGH
Non-HDL Cholesterol (Calc): 167 mg/dL (calc) — ABNORMAL HIGH (ref <130)
Total CHOL/HDL Ratio: 5.1 (calc) — ABNORMAL HIGH (ref <5.0)
Triglycerides: 160 mg/dL — ABNORMAL HIGH (ref <150)

## 2020-06-07 LAB — IRON,TIBC AND FERRITIN PANEL
%SAT: 37 % (calc) (ref 16–45)
Ferritin: 60 ng/mL (ref 16–288)
Iron: 131 ug/dL (ref 45–160)
TIBC: 352 mcg/dL (calc) (ref 250–450)

## 2020-06-07 LAB — VITAMIN D 25 HYDROXY (VIT D DEFICIENCY, FRACTURES): Vit D, 25-Hydroxy: 28 ng/mL — ABNORMAL LOW (ref 30–100)

## 2020-06-09 DIAGNOSIS — H40033 Anatomical narrow angle, bilateral: Secondary | ICD-10-CM | POA: Diagnosis not present

## 2020-06-09 DIAGNOSIS — H2513 Age-related nuclear cataract, bilateral: Secondary | ICD-10-CM | POA: Diagnosis not present

## 2020-06-20 DIAGNOSIS — H524 Presbyopia: Secondary | ICD-10-CM | POA: Diagnosis not present

## 2020-07-25 ENCOUNTER — Telehealth: Payer: Self-pay | Admitting: Family Medicine

## 2020-07-25 NOTE — Telephone Encounter (Signed)
Left message for patient to call back and schedule Medicare Annual Wellness Visit (AWV) either virtually or in office. No detailed message left    Last AWV ;10/19/2018  please schedule at anytime with [facility] Nurse Health Advisor 1 or 2   This should be a 45 minute visit.

## 2020-10-14 ENCOUNTER — Ambulatory Visit (INDEPENDENT_AMBULATORY_CARE_PROVIDER_SITE_OTHER): Payer: Medicare Other

## 2020-10-14 DIAGNOSIS — Z0000 Encounter for general adult medical examination without abnormal findings: Secondary | ICD-10-CM | POA: Diagnosis not present

## 2020-10-14 NOTE — Patient Instructions (Signed)
Angel Irwin , Thank you for taking time to come for your Medicare Wellness Visit. I appreciate your ongoing commitment to your health goals. Please review the following plan we discussed and let me know if I can assist you in the future.   Screening recommendations/referrals: Colonoscopy: scheduled 12/04/2020 Mammogram: scheduled 11/10/2020 Bone Density: scheduled 11/10/2020 Recommended yearly ophthalmology/optometry visit for glaucoma screening and checkup Recommended yearly dental visit for hygiene and checkup  Vaccinations: Influenza vaccine: due in the fall  Pneumococcal vaccine: completed series  Tdap vaccine: current 05/04/2013  due 2025 Shingles vaccine: completed series    Advanced directives: will provide copies   Conditions/risks identified: none   Next appointment: none    Preventive Care 70 Years and Older, Female Preventive care refers to lifestyle choices and visits with your health care provider that can promote health and wellness. What does preventive care include? A yearly physical exam. This is also called an annual well check. Dental exams once or twice a year. Routine eye exams. Ask your health care provider how often you should have your eyes checked. Personal lifestyle choices, including: Daily care of your teeth and gums. Regular physical activity. Eating a healthy diet. Avoiding tobacco and drug use. Limiting alcohol use. Practicing safe sex. Taking low-dose aspirin every day. Taking vitamin and mineral supplements as recommended by your health care provider. What happens during an annual well check? The services and screenings done by your health care provider during your annual well check will depend on your age, overall health, lifestyle risk factors, and family history of disease. Counseling  Your health care provider may ask you questions about your: Alcohol use. Tobacco use. Drug use. Emotional well-being. Home and relationship  well-being. Sexual activity. Eating habits. History of falls. Memory and ability to understand (cognition). Work and work Statistician. Reproductive health. Screening  You may have the following tests or measurements: Height, weight, and BMI. Blood pressure. Lipid and cholesterol levels. These may be checked every 5 years, or more frequently if you are over 62 years old. Skin check. Lung cancer screening. You may have this screening every year starting at age 50 if you have a 30-pack-year history of smoking and currently smoke or have quit within the past 15 years. Fecal occult blood test (FOBT) of the stool. You may have this test every year starting at age 23. Flexible sigmoidoscopy or colonoscopy. You may have a sigmoidoscopy every 5 years or a colonoscopy every 10 years starting at age 63. Hepatitis C blood test. Hepatitis B blood test. Sexually transmitted disease (STD) testing. Diabetes screening. This is done by checking your blood sugar (glucose) after you have not eaten for a while (fasting). You may have this done every 1-3 years. Bone density scan. This is done to screen for osteoporosis. You may have this done starting at age 63. Mammogram. This may be done every 1-2 years. Talk to your health care provider about how often you should have regular mammograms. Talk with your health care provider about your test results, treatment options, and if necessary, the need for more tests. Vaccines  Your health care provider may recommend certain vaccines, such as: Influenza vaccine. This is recommended every year. Tetanus, diphtheria, and acellular pertussis (Tdap, Td) vaccine. You may need a Td booster every 10 years. Zoster vaccine. You may need this after age 40. Pneumococcal 13-valent conjugate (PCV13) vaccine. One dose is recommended after age 70. Pneumococcal polysaccharide (PPSV23) vaccine. One dose is recommended after age 70. Talk to your health  care provider about which  screenings and vaccines you need and how often you need them. This information is not intended to replace advice given to you by your health care provider. Make sure you discuss any questions you have with your health care provider. Document Released: 05/02/2015 Document Revised: 12/24/2015 Document Reviewed: 02/04/2015 Elsevier Interactive Patient Education  2017 [place] Prevention in the Home Falls can cause injuries. They can happen to people of all ages. There are many things you can do to make your home safe and to help prevent falls. What can I do on the outside of my home? Regularly fix the edges of walkways and driveways and fix any cracks. Remove anything that might make you trip as you walk through a door, such as a raised step or threshold. Trim any bushes or trees on the path to your home. Use bright outdoor lighting. Clear any walking paths of anything that might make someone trip, such as rocks or tools. Regularly check to see if handrails are loose or broken. Make sure that both sides of any steps have handrails. Any raised decks and porches should have guardrails on the edges. Have any leaves, snow, or ice cleared regularly. Use sand or salt on walking paths during winter. Clean up any spills in your garage right away. This includes oil or grease spills. What can I do in the bathroom? Use night lights. Install grab bars by the toilet and in the tub and shower. Do not use towel bars as grab bars. Use non-skid mats or decals in the tub or shower. If you need to sit down in the shower, use a plastic, non-slip stool. Keep the floor dry. Clean up any water that spills on the floor as soon as it happens. Remove soap buildup in the tub or shower regularly. Attach bath mats securely with double-sided non-slip rug tape. Do not have throw rugs and other things on the floor that can make you trip. What can I do in the bedroom? Use night lights. Make sure that you have a  light by your bed that is easy to reach. Do not use any sheets or blankets that are too big for your bed. They should not hang down onto the floor. Have a firm chair that has side arms. You can use this for support while you get dressed. Do not have throw rugs and other things on the floor that can make you trip. What can I do in the kitchen? Clean up any spills right away. Avoid walking on wet floors. Keep items that you use a lot in easy-to-reach places. If you need to reach something above you, use a strong step stool that has a grab bar. Keep electrical cords out of the way. Do not use floor polish or wax that makes floors slippery. If you must use wax, use non-skid floor wax. Do not have throw rugs and other things on the floor that can make you trip. What can I do with my stairs? Do not leave any items on the stairs. Make sure that there are handrails on both sides of the stairs and use them. Fix handrails that are broken or loose. Make sure that handrails are as long as the stairways. Check any carpeting to make sure that it is firmly attached to the stairs. Fix any carpet that is loose or worn. Avoid having throw rugs at the top or bottom of the stairs. If you do have throw rugs, attach them to  the floor with carpet tape. Make sure that you have a light switch at the top of the stairs and the bottom of the stairs. If you do not have them, ask someone to add them for you. What else can I do to help prevent falls? Wear shoes that: Do not have high heels. Have rubber bottoms. Are comfortable and fit you well. Are closed at the toe. Do not wear sandals. If you use a stepladder: Make sure that it is fully opened. Do not climb a closed stepladder. Make sure that both sides of the stepladder are locked into place. Ask someone to hold it for you, if possible. Clearly mark and make sure that you can see: Any grab bars or handrails. First and last steps. Where the edge of each step  is. Use tools that help you move around (mobility aids) if they are needed. These include: Canes. Walkers. Scooters. Crutches. Turn on the lights when you go into a dark area. Replace any light bulbs as soon as they burn out. Set up your furniture so you have a clear path. Avoid moving your furniture around. If any of your floors are uneven, fix them. If there are any pets around you, be aware of where they are. Review your medicines with your doctor. Some medicines can make you feel dizzy. This can increase your chance of falling. Ask your doctor what other things that you can do to help prevent falls. This information is not intended to replace advice given to you by your health care provider. Make sure you discuss any questions you have with your health care provider. Document Released: 01/30/2009 Document Revised: 09/11/2015 Document Reviewed: 05/10/2014 Elsevier Interactive Patient Education  2017 [employer].

## 2020-10-14 NOTE — Progress Notes (Signed)
Subjective:   Haruka Kowaleski is a 70 y.o. female who presents for Medicare Annual (Subsequent) preventive examination. Pt unable to connect to online visit switched to telephone instead. Virtual Visit via Video Note  I connected with [name] by a video enabled telemedicine application and verified that I am speaking with the correct person using two identifiers.  Location: Patient: Home Provider: Office Persons participating in the virtual visit: patient, provider   I discussed the limitations of evaluation and management by telemedicine and the availability of in person appointments. The patient expressed understanding and agreed to proceed.    Mickel Baas Janaisha Tolsma,LPN  Review of Systems    N/a       Objective:    There were no vitals filed for this visit. There is no height or weight on file to calculate BMI.  Advanced Directives 10/01/2019 09/28/2019 07/29/2017 07/23/2016 01/01/2015 09/25/2014 07/23/2014  Does Patient Have a Medical Advance Directive? No No Yes No No No;Yes Yes  Type of Advance Directive - - - - - Press photographer;Living will -  Copy of [place] in Chart? - - - - - - No - copy requested  Would patient like information on creating a medical advance directive? - Yes (MAU/Ambulatory/Procedural Areas - Information given) - No - Patient declined - - -  Pre-existing out of facility DNR order (yellow form or pink MOST form) - - - - - - -    Current Medications (verified) Outpatient Encounter Medications as of 10/14/2020  Medication Sig   albuterol (VENTOLIN HFA) 108 (90 Base) MCG/ACT inhaler Inhale 2 puffs into the lungs every 6 (six) hours as needed for wheezing or shortness of breath.   HYDROcodone-acetaminophen (NORCO) 5-325 MG tablet Take 1-2 tablets by mouth every 8 (eight) hours as needed for moderate pain or severe pain.   VITAMIN D PO Take 6,000 Units by mouth daily. With K2 100 mcg   No facility-administered encounter  medications on file as of 10/14/2020.    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   Iron deficiency anemia due to chronic blood loss 07/23/2014   Vitamin D deficiency 08/26/2016   Past Surgical History:  Procedure Laterality Date   ANTERIOR AND POSTERIOR REPAIR  02/22/2011   Procedure: ANTERIOR (CYSTOCELE) AND POSTERIOR REPAIR (RECTOCELE);  Surgeon: Delice Lesch, MD;  Location: [facility];  Service: Gynecology;  Laterality: N/A;   COLONOSCOPY     CYSTOSCOPY  02/22/2011   Procedure: CYSTOSCOPY;  Surgeon: Delice Lesch, MD;  Location: [facility];  Service: Gynecology;  Laterality: N/A;   DILATION AND CURETTAGE OF UTERUS  1980   EXTERNAL FIXATION LEG Left 07/24/2016   Procedure: EXTERNAL FIXATION LEFT LOWER LEG;  Surgeon: Rod Can, MD;  Location: [facility];  Service: Orthopedics;  Laterality: Left;   HARDWARE REMOVAL Left  10/01/2019   Procedure: HARDWARE REMOVAL TIBIA;  Surgeon: Altamese [place], MD;  Location: [facility];  Service: Orthopedics;  Laterality: Left;   ORIF TIBIA PLATEAU Left 07/27/2016   Procedure: OPEN REDUCTION INTERNAL FIXATION (ORIF) TIBIAL PLATEAU;  Surgeon: Altamese [place], MD;  Location: [facility];  Service: Orthopedics;  Laterality: Left;   SALPINGOOPHORECTOMY  02/22/2011   Procedure: SALPINGO OOPHERECTOMY;  Surgeon: Delice Lesch, MD;  Location: [facility];  Service: Gynecology;  Laterality: Bilateral;   VAGINAL  HYSTERECTOMY  02/22/2011   Procedure: HYSTERECTOMY VAGINAL;  Surgeon: Delice Lesch, MD;  Location: [facility];  Service: Gynecology;  Laterality: N/A;   WISDOM TOOTH EXTRACTION     Family History  Problem Relation Age of Onset   Diabetes Father    Lung cancer Sister 77   Diabetes Mellitus I Sister    Heart disease Maternal Grandmother    Diabetes Maternal Grandmother    Heart disease Maternal Grandfather    Kidney disease Maternal Grandfather    Other Mother 77       murdered   Other Brother 37       murdered   Diabetes Paternal Grandmother    Brain cancer Paternal Grandfather    Diabetes Sister    Colon cancer Neg Hx    Colon polyps Neg Hx    Esophageal cancer Neg Hx    Gallbladder disease Neg Hx    Breast cancer Neg Hx    Social History   Socioeconomic History   Marital status: Married    Spouse name: Not on file   Number of children: 4   Years of education: Not on file   Highest education level: Not on file  Occupational History   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  Tobacco Use   Smoking status: Never   Smokeless tobacco: Never  Vaping Use   Vaping Use: Never used  Substance and Sexual Activity   Alcohol use: Yes    Alcohol/week: 0.0 standard drinks    Comment: Rarely   Drug use: No   Sexual activity: Not on file  Other Topics Concern   Not on file  Social History Narrative   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  Patient Care Team: Caren Macadam, MD as PCP - General (Family Medicine)  Indicate any recent Medical Services you may have received from other than [facility] providers in the past year (date may be approximate).     Assessment:   This is a routine wellness examination for Andora.  Hearing/Vision screen No results found.  Dietary issues and exercise activities discussed:     Goals Addressed   None    Depression Screen PHQ 2/9 Scores 06/06/2020 10/19/2018 09/08/2017 07/29/2017  PHQ - 2 Score 0 0 0 0  PHQ- 9 Score -  1 - -    Fall Risk Fall Risk  06/06/2020 10/19/2018 09/08/2017 07/29/2017  Falls in the past year? 1 0 No Yes  Comment - - - injured in April  Number falls in past yr: 0 0 - -  Injury with Fall? 1 0 - -    FALL RISK PREVENTION PERTAINING TO THE HOME:  Any stairs in or around the home? Yes  If so, are there any without handrails? Yes  Home free of loose throw rugs in walkways, pet beds, electrical cords, etc? Yes  Adequate lighting in your home to reduce risk of falls? Yes   ASSISTIVE DEVICES UTILIZED TO PREVENT FALLS:  Life alert? No  Use of a cane, walker or w/c? No  Grab bars in the bathroom? No  Shower chair or bench in shower? No  Elevated toilet seat or a handicapped toilet? No   Cognitive Function: Normal cognitive status assessed by direct observation by this Nurse Health Advisor. No abnormalities found.   MMSE - Mini Mental State Exam 07/29/2017  Not completed: (No Data)        Immunizations Immunization History  Administered Date(s) Administered   Fluad Quad(high Dose 65+) 06/06/2020   Influenza, High Dose Seasonal PF 02/19/2016, 03/09/2018   Influenza,inj,Quad PF,6+ Mos 05/04/2013, 04/08/2014, 05/28/2015   PFIZER(Purple Top)SARS-COV-2 Vaccination 05/21/2019, 06/18/2019   Pneumococcal Conjugate-13 02/19/2016   Pneumococcal Polysaccharide-23 09/08/2017   Tdap 05/04/2013    TDAP status: Up to date  Flu Vaccine status: Up to  date  Pneumococcal vaccine status: Up to date  Covid-19 vaccine status: Completed vaccines  Qualifies for Shingles Vaccine? Yes   Zostavax completed No   Shingrix Completed?: Yes  Screening Tests Health Maintenance  Topic Date Due   Zoster Vaccines- Shingrix (1 of 2) Never done   COVID-19 Vaccine (3 - Booster for Pfizer series) 11/18/2019   MAMMOGRAM  12/04/2020 (Originally 08/30/2019)   COLONOSCOPY (Pts 45-52yrs Insurance coverage will need to be confirmed)  12/04/2020 (Originally 06/14/2019)   INFLUENZA VACCINE  11/17/2020   TETANUS/TDAP  05/05/2023   DEXA SCAN  Completed   Hepatitis C Screening  Completed   PNA vac Low Risk Adult  Completed   HPV VACCINES  Aged Out    Health Maintenance  Health Maintenance Due  Topic Date Due   Zoster Vaccines- Shingrix (1 of 2) Never done   COVID-19 Vaccine (3 - Booster for Pfizer series) 11/18/2019    Colorectal cancer screening: Type of screening: Colonoscopy. Completed scheduled 12/04/2020. Repeat every 5 years  Mammogram status: Completed scheduled 11/10/2020. Repeat every year  Bone Density status: Completed scheduled 11/10/2020. Results reflect: Bone density results: OSTEOPENIA. Repeat every 5 years.  Lung Cancer Screening: (Low Dose CT Chest recommended if Age 72-80 years, 30 pack-year currently smoking OR have quit w/in 15years.) does not qualify.   Lung Cancer Screening Referral: n/a  Additional Screening:  Hepatitis C Screening: does not qualify; Completed 05/28/15  Vision Screening: Recommended annual ophthalmology exams for early detection of glaucoma and other disorders of the eye. Is the patient up to date with their annual eye exam?  Yes  Who is the provider or what is the name of the office in which the patient attends annual eye exams? Dr.Lee If pt is not established with a provider, would they like to be referred to a provider to establish care? No .   Dental Screening: Recommended annual dental exams for proper  oral hygiene  Community Resource Referral / Chronic Care Management: CRR required  this visit?  No   CCM required this visit?  No      Plan:     I have personally reviewed and noted the following in the patient's chart:   Medical and social history Use of alcohol, tobacco or illicit drugs  Current medications and supplements including opioid prescriptions.  Functional ability and status Nutritional status Physical activity Advanced directives List of other physicians Hospitalizations, surgeries, and ER visits in previous 12 months Vitals Screenings to include cognitive, depression, and falls Referrals and appointments  In addition, I have reviewed and discussed with patient certain preventive protocols, quality metrics, and best practice recommendations. A written personalized care plan for preventive services as well as general preventive health recommendations were provided to patient.     Randel Pigg, LPN   11/19/2120   Nurse Notes: none

## 2020-11-10 ENCOUNTER — Ambulatory Visit
Admission: RE | Admit: 2020-11-10 | Discharge: 2020-11-10 | Disposition: A | Discharge status: Home / Self Care | Payer: Medicare Other | Source: Ambulatory Visit | Attending: Family Medicine | Admitting: Family Medicine

## 2020-11-10 ENCOUNTER — Other Ambulatory Visit: Payer: Self-pay

## 2020-11-10 ENCOUNTER — Other Ambulatory Visit: Payer: Self-pay | Admitting: Family Medicine

## 2020-11-10 ENCOUNTER — Encounter: Payer: Self-pay | Admitting: Internal Medicine

## 2020-11-10 DIAGNOSIS — Z780 Asymptomatic menopausal state: Secondary | ICD-10-CM | POA: Diagnosis not present

## 2020-11-10 DIAGNOSIS — Z1231 Encounter for screening mammogram for malignant neoplasm of breast: Secondary | ICD-10-CM

## 2020-11-10 DIAGNOSIS — M810 Age-related osteoporosis without current pathological fracture: Secondary | ICD-10-CM | POA: Diagnosis not present

## 2020-11-12 ENCOUNTER — Other Ambulatory Visit: Payer: Self-pay | Admitting: Family Medicine

## 2020-11-12 DIAGNOSIS — R928 Other abnormal and inconclusive findings on diagnostic imaging of breast: Secondary | ICD-10-CM

## 2020-11-26 DIAGNOSIS — S62614A Displaced fracture of proximal phalanx of right ring finger, initial encounter for closed fracture: Secondary | ICD-10-CM | POA: Diagnosis not present

## 2020-11-28 ENCOUNTER — Other Ambulatory Visit: Payer: Self-pay

## 2020-11-28 ENCOUNTER — Ambulatory Visit: Payer: Medicare Other

## 2020-11-28 ENCOUNTER — Ambulatory Visit
Admission: RE | Admit: 2020-11-28 | Discharge: 2020-11-28 | Disposition: A | Discharge status: Home / Self Care | Payer: Medicare Other | Source: Ambulatory Visit | Attending: Family Medicine | Admitting: Family Medicine

## 2020-11-28 DIAGNOSIS — N6489 Other specified disorders of breast: Secondary | ICD-10-CM | POA: Diagnosis not present

## 2020-11-28 DIAGNOSIS — R928 Other abnormal and inconclusive findings on diagnostic imaging of breast: Secondary | ICD-10-CM

## 2020-11-28 DIAGNOSIS — R922 Inconclusive mammogram: Secondary | ICD-10-CM | POA: Diagnosis not present

## 2020-12-02 ENCOUNTER — Encounter: Payer: Self-pay | Admitting: Family Medicine

## 2020-12-10 DIAGNOSIS — S62614D Displaced fracture of proximal phalanx of right ring finger, subsequent encounter for fracture with routine healing: Secondary | ICD-10-CM | POA: Diagnosis not present

## 2020-12-31 DIAGNOSIS — T8484XD Pain due to internal orthopedic prosthetic devices, implants and grafts, subsequent encounter: Secondary | ICD-10-CM | POA: Diagnosis not present

## 2020-12-31 DIAGNOSIS — S62614D Displaced fracture of proximal phalanx of right ring finger, subsequent encounter for fracture with routine healing: Secondary | ICD-10-CM | POA: Diagnosis not present

## 2021-02-16 DIAGNOSIS — H43393 Other vitreous opacities, bilateral: Secondary | ICD-10-CM | POA: Diagnosis not present

## 2021-09-05 IMAGING — MG MM DIGITAL DIAGNOSTIC UNILAT*R* W/ TOMO W/ CAD
2 series · 3 of 6 positions shown · non-contrast
Comparison: Previous exam(s).

CLINICAL DATA: Screening recall for a right breast asymmetry.

EXAM:
DIGITAL DIAGNOSTIC UNILATERAL RIGHT MAMMOGRAM WITH TOMOSYNTHESIS AND
CAD
TECHNIQUE: Right digital diagnostic mammography and breast tomosynthesis was
performed. The images were evaluated with computer-aided detection.

[R MLO synth-2D]
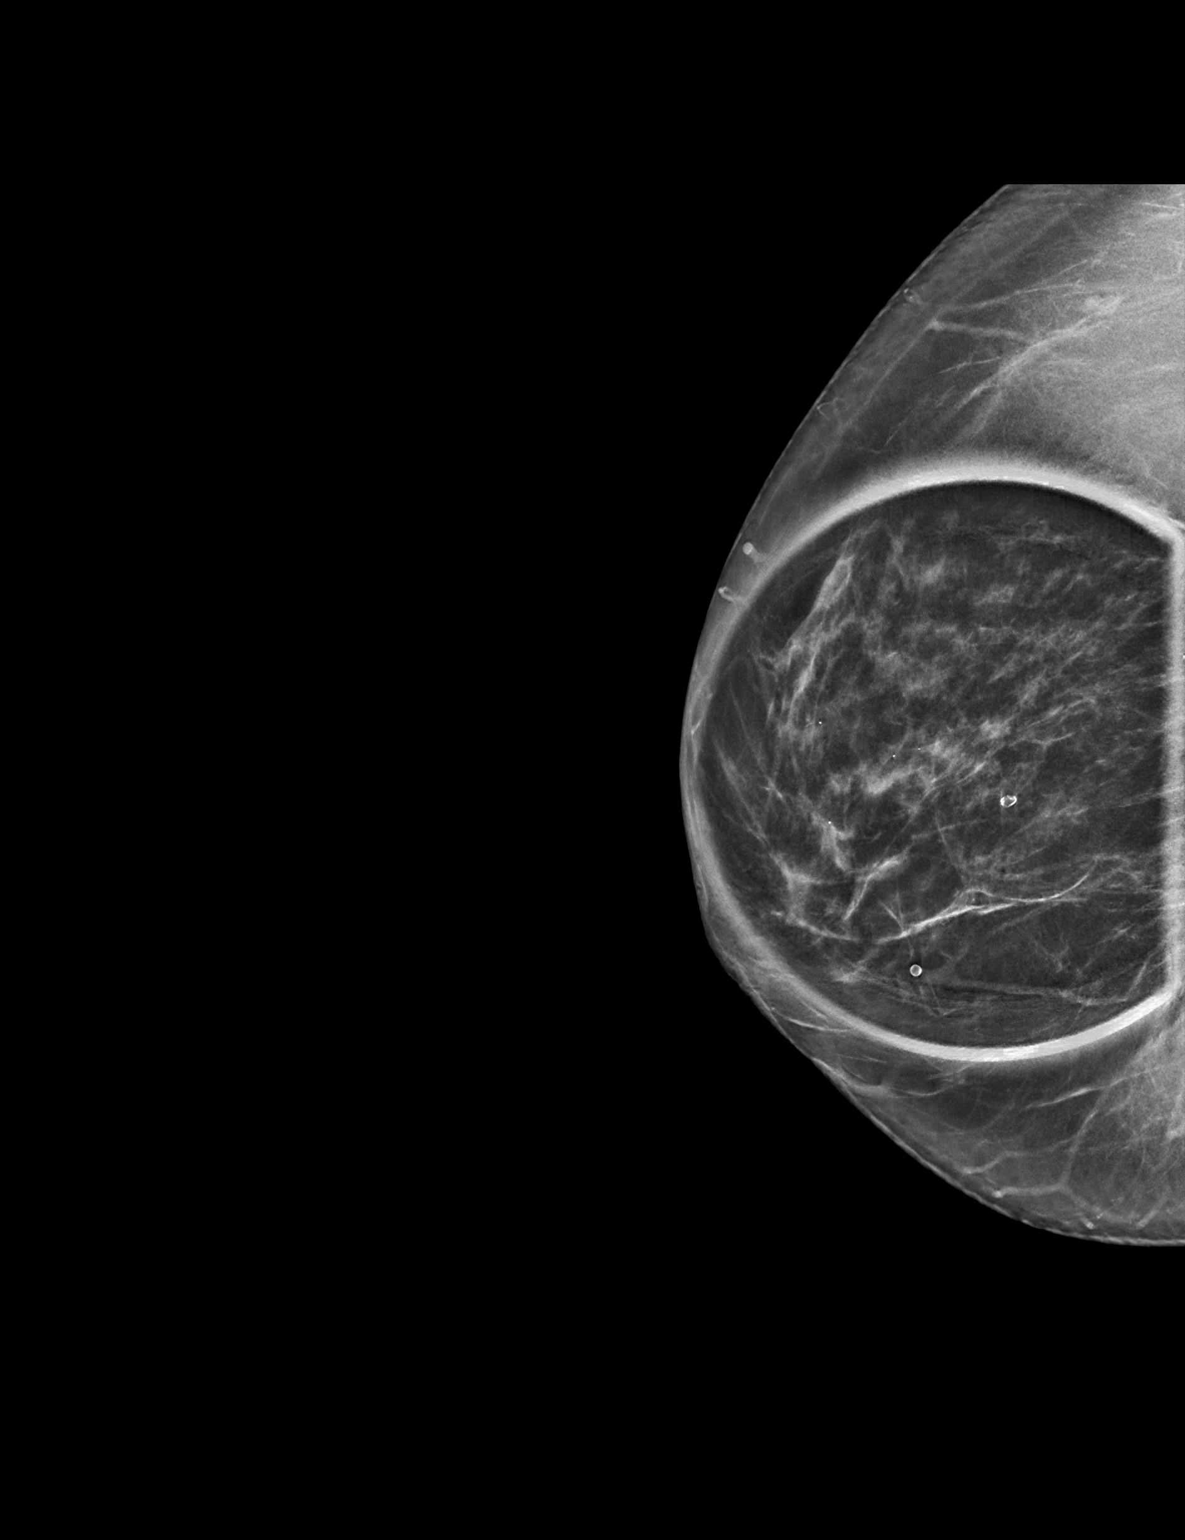

[R MLO tomo · 2 of 58 frames shown]
[frame 19/58]
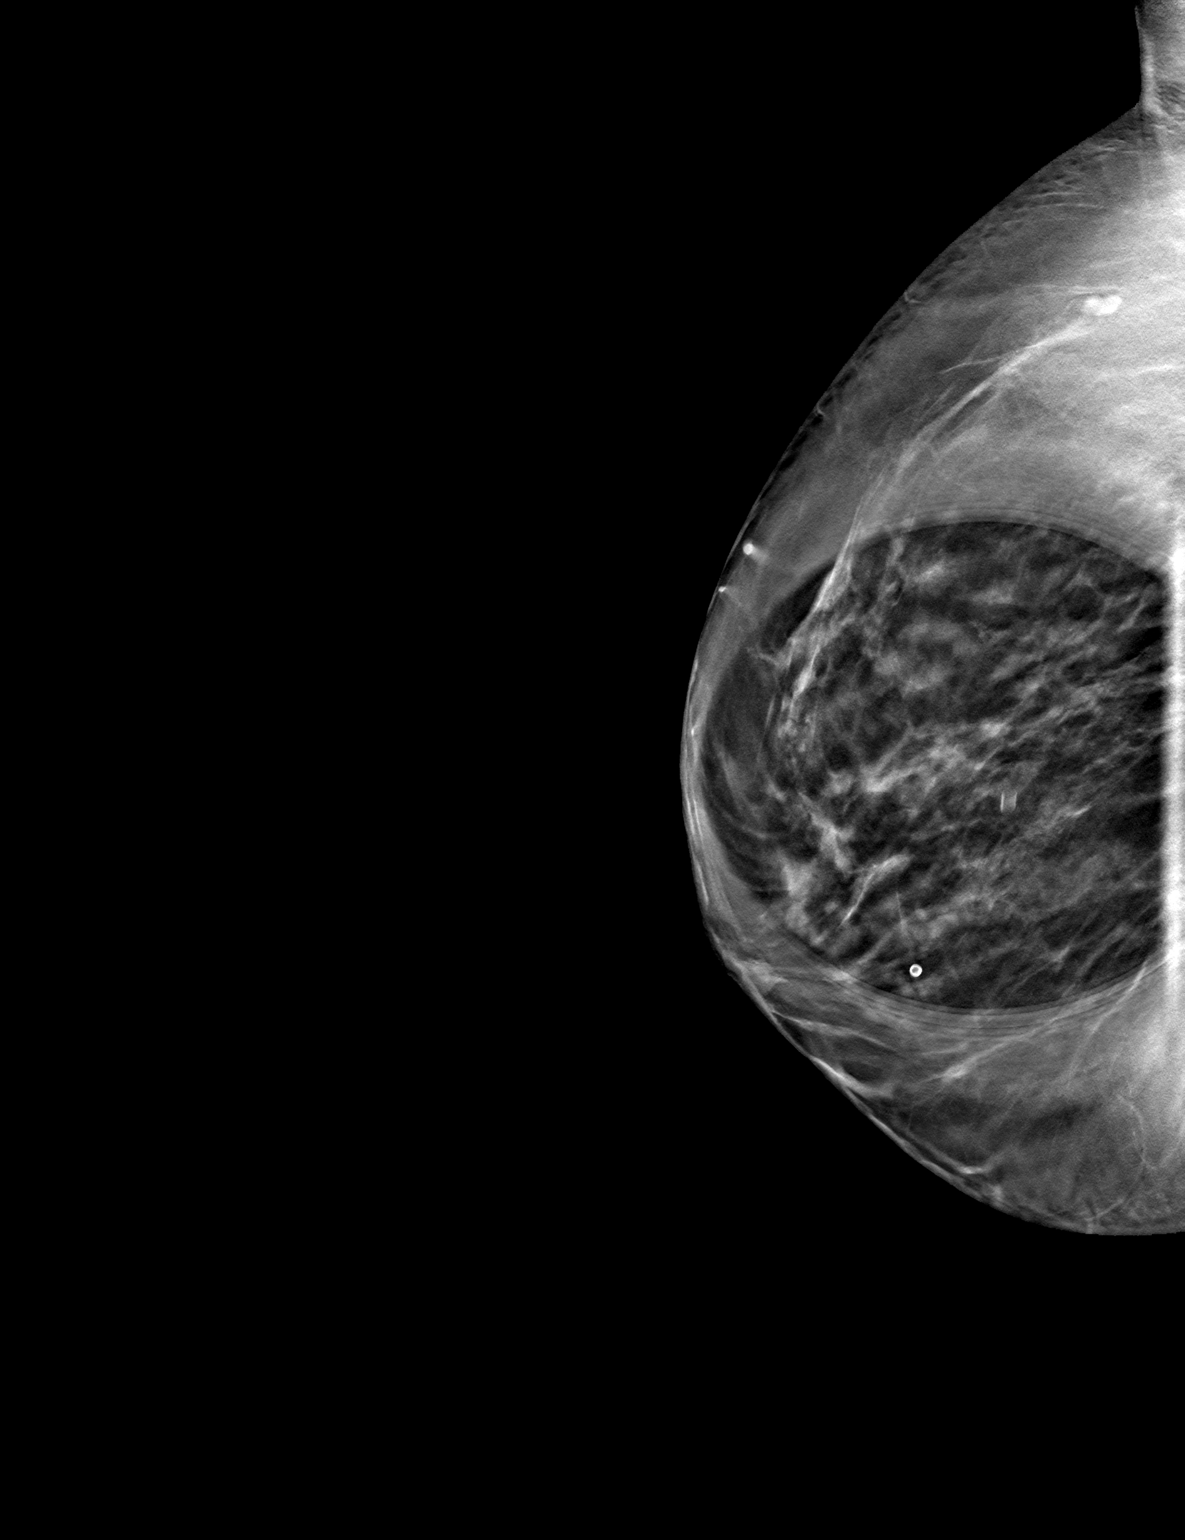
[frame 29/58]
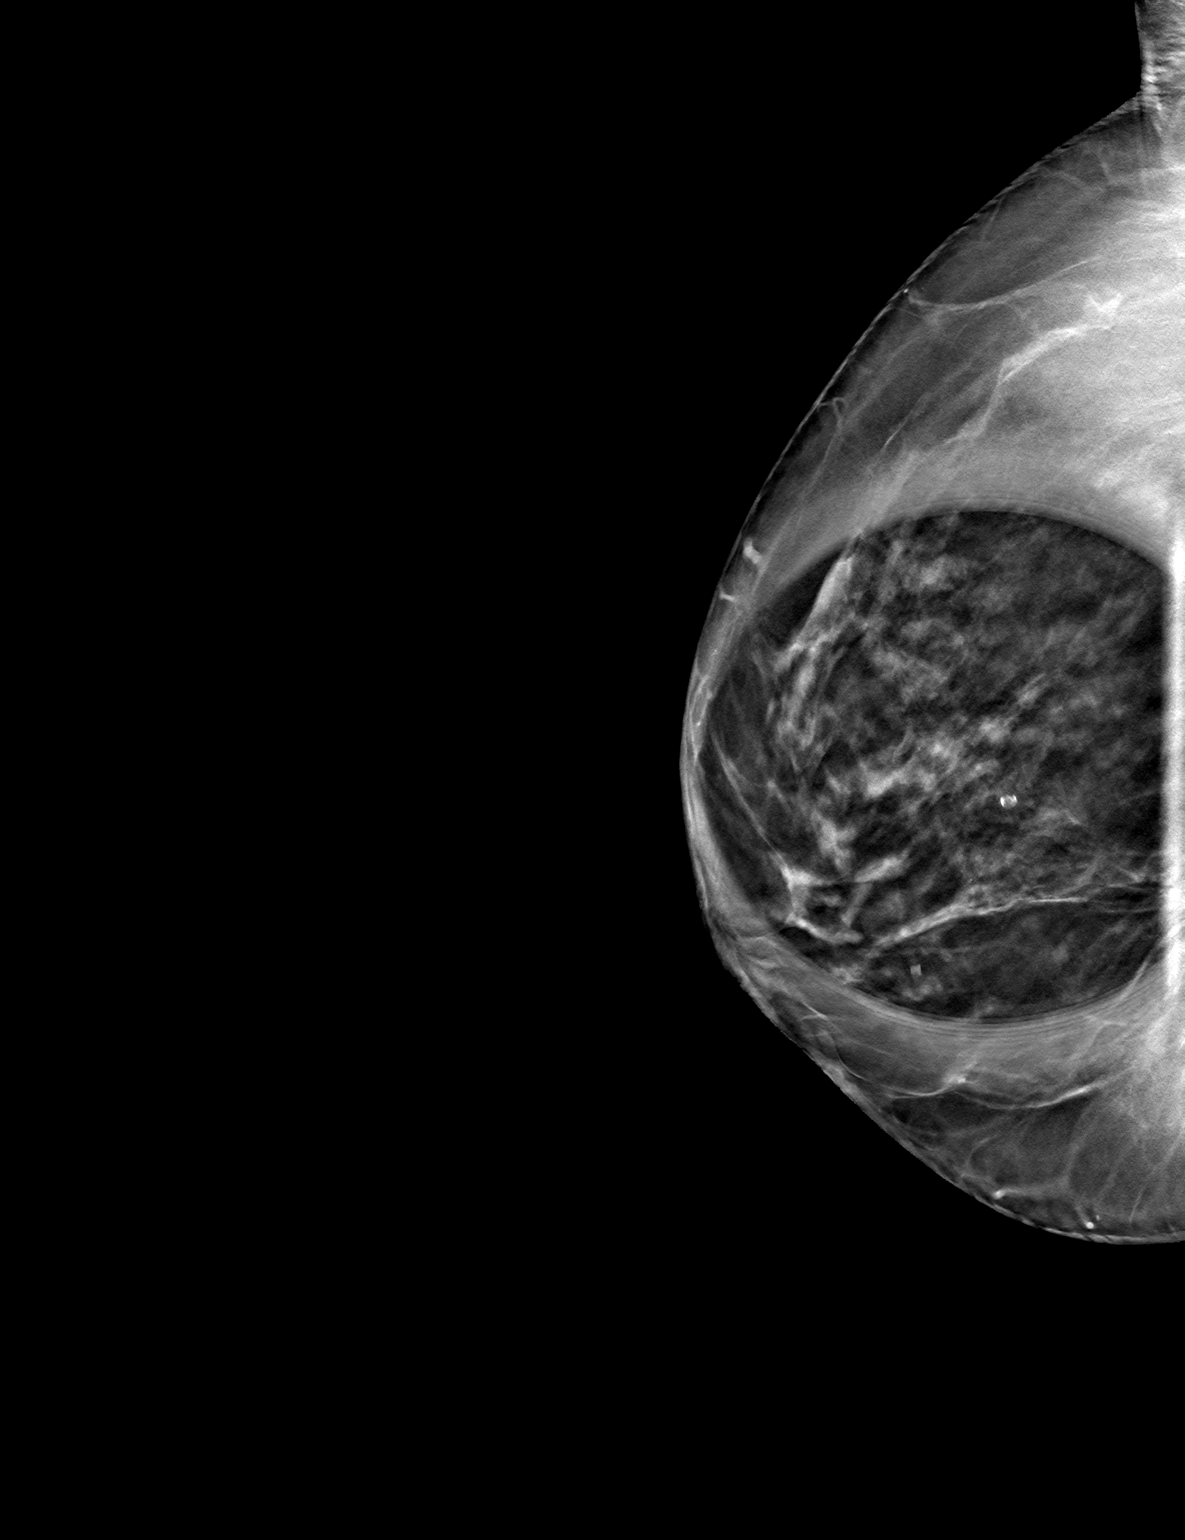

[3 of 6 positions shown; findings below may reference images not displayed]

ACR Breast Density Category b: There are scattered areas of
fibroglandular density.
FINDINGS: The asymmetry in the anterior right breast seen on the MLO view only
resolves with spot compression tomosynthesis imaging. No suspicious
calcifications, masses or areas of distortion are seen in the right
breast.
IMPRESSION: Resolution of the right breast asymmetry consistent with overlapping
fibroglandular tissue.

RECOMMENDATION:
Screening mammogram in one year.(Code:[id number])

I have discussed the findings and recommendations with the patient.
If applicable, a reminder letter will be sent to the patient
regarding the next appointment.

BI-RADS CATEGORY  1: Negative.

## 2021-09-08 ENCOUNTER — Encounter: Payer: Self-pay | Admitting: Internal Medicine

## 2021-09-22 DIAGNOSIS — H524 Presbyopia: Secondary | ICD-10-CM | POA: Diagnosis not present

## 2021-09-23 ENCOUNTER — Encounter: Payer: Self-pay | Admitting: Internal Medicine

## 2021-09-30 ENCOUNTER — Ambulatory Visit (AMBULATORY_SURGERY_CENTER): Payer: Self-pay | Admitting: *Deleted

## 2021-09-30 VITALS — Ht 61.0 in | Wt 150.0 lb

## 2021-09-30 DIAGNOSIS — Z86010 Personal history of colonic polyps: Secondary | ICD-10-CM

## 2021-09-30 DIAGNOSIS — H52223 Regular astigmatism, bilateral: Secondary | ICD-10-CM | POA: Diagnosis not present

## 2021-09-30 DIAGNOSIS — H524 Presbyopia: Secondary | ICD-10-CM | POA: Diagnosis not present

## 2021-09-30 MED ORDER — NA SULFATE-K SULFATE-MG SULF 17.5-3.13-1.6 GM/177ML PO SOLN: 1.0000 | ORAL | 0 refills | Status: DC

## 2021-09-30 NOTE — Progress Notes (Signed)
Patient is here in-person for PV. Patient denies any allergies to eggs or soy. Patient denies any problems with anesthesia/sedation. Patient is not on any oxygen at home. Patient is not taking any diet/weight loss medications or blood thinners. Patient is aware of our care-partner policy. Patient notified to use Singlecare or Good-Rx for prescription. Suprep rx printed and given to pt per pt's request.   EMMI education assigned to the patient for the procedure, sent to MyChart.

## 2021-10-15 ENCOUNTER — Encounter: Payer: Self-pay | Admitting: Internal Medicine

## 2021-10-16 ENCOUNTER — Encounter: Payer: Medicare Other | Admitting: Family

## 2021-10-16 ENCOUNTER — Ambulatory Visit (INDEPENDENT_AMBULATORY_CARE_PROVIDER_SITE_OTHER): Payer: No Typology Code available for payment source

## 2021-10-16 VITALS — BP 122/62 | HR 58 | Temp 97.5°F | Ht 61.0 in | Wt 153.6 lb

## 2021-10-16 DIAGNOSIS — Z0000 Encounter for general adult medical examination without abnormal findings: Secondary | ICD-10-CM

## 2021-10-16 NOTE — Patient Instructions (Addendum)
Angel Irwin , Thank you for taking time to come for your Medicare Wellness Visit. I appreciate your ongoing commitment to your health goals. Please review the following plan we discussed and let me know if I can assist you in the future.   These are the goals we discussed:  Goals       Lose weight (pt-stated)      I would like to lose a little more weight.      Weight (lb) < 160 lb (72.6 kg)      Plan is: Plan you 1/2 diet  Will try to cut back on portions  Check out  online nutrition programs as GumSearch.nl and http://vang.com/; fit28m; Look for foods with "whole" wheat; bran; oatmeal etc Shot at the farmer's markets in season for fresher choices  Watch for "hydrogenated" on the label of oils which are trans-fats.  Watch for "high fructose corn syrup" in snacks, yogurt or ketchup  Meats have less marbling; bright colored fruits and vegetables;  Canned; dump out liquid and wash vegetables. Be mindful of what we are eating  Portion control is essential to a health weight! Sit down; take a break and enjoy your meal; take smaller bites; put the fork down between bites;  It takes 20 minutes to get full; so check in with your fullness cues and stop eating when you start to fill full               This is a list of the screening recommended for you and due dates:  Health Maintenance  Topic Date Due   COVID-19 Vaccine (3 - Pfizer series) 11/01/2021*   Zoster (Shingles) Vaccine (1 of 2) 01/16/2022*   Colon Cancer Screening  10/17/2022*   Flu Shot  11/17/2021   Mammogram  11/11/2022   Tetanus Vaccine  05/05/2023   Pneumonia Vaccine  Completed   DEXA scan (bone density measurement)  Completed   Hepatitis C Screening: USPSTF Recommendation to screen - Ages 169-79yo.  Completed   HPV Vaccine  Aged Out  *Topic was postponed. The date shown is not the original due date.    Advanced directives: No  Conditions/risks identified: None  Next appointment: Follow up in one year  for your annual wellness visit    Preventive Care 65 Years and Older, Female Preventive care refers to lifestyle choices and visits with your health care provider that can promote health and wellness. What does preventive care include? A yearly physical exam. This is also called an annual well check. Dental exams once or twice a year. Routine eye exams. Ask your health care provider how often you should have your eyes checked. Personal lifestyle choices, including: Daily care of your teeth and gums. Regular physical activity. Eating a healthy diet. Avoiding tobacco and drug use. Limiting alcohol use. Practicing safe sex. Taking low-dose aspirin every day. Taking vitamin and mineral supplements as recommended by your health care provider. What happens during an annual well check? The services and screenings done by your health care provider during your annual well check will depend on your age, overall health, lifestyle risk factors, and family history of disease. Counseling  Your health care provider may ask you questions about your: Alcohol use. Tobacco use. Drug use. Emotional well-being. Home and relationship well-being. Sexual activity. Eating habits. History of falls. Memory and ability to understand (cognition). Work and work eStatistician Reproductive health. Screening  You may have the following tests or measurements: Height, weight, and BMI. Blood pressure. Lipid  and cholesterol levels. These may be checked every 5 years, or more frequently if you are over 58 years old. Skin check. Lung cancer screening. You may have this screening every year starting at age 57 if you have a 30-pack-year history of smoking and currently smoke or have quit within the past 15 years. Fecal occult blood test (FOBT) of the stool. You may have this test every year starting at age 18. Flexible sigmoidoscopy or colonoscopy. You may have a sigmoidoscopy every 5 years or a colonoscopy every 10  years starting at age 13. Hepatitis C blood test. Hepatitis B blood test. Sexually transmitted disease (STD) testing. Diabetes screening. This is done by checking your blood sugar (glucose) after you have not eaten for a while (fasting). You may have this done every 1-3 years. Bone density scan. This is done to screen for osteoporosis. You may have this done starting at age 65. Mammogram. This may be done every 1-2 years. Talk to your health care provider about how often you should have regular mammograms. Talk with your health care provider about your test results, treatment options, and if necessary, the need for more tests. Vaccines  Your health care provider may recommend certain vaccines, such as: Influenza vaccine. This is recommended every year. Tetanus, diphtheria, and acellular pertussis (Tdap, Td) vaccine. You may need a Td booster every 10 years. Zoster vaccine. You may need this after age 46. Pneumococcal 13-valent conjugate (PCV13) vaccine. One dose is recommended after age 19. Pneumococcal polysaccharide (PPSV23) vaccine. One dose is recommended after age 46. Talk to your health care provider about which screenings and vaccines you need and how often you need them. This information is not intended to replace advice given to you by your health care provider. Make sure you discuss any questions you have with your health care provider. Document Released: 05/02/2015 Document Revised: 12/24/2015 Document Reviewed: 02/04/2015 Elsevier Interactive Patient Education  2017 [place] Prevention in the Home Falls can cause injuries. They can happen to people of all ages. There are many things you can do to make your home safe and to help prevent falls. What can I do on the outside of my home? Regularly fix the edges of walkways and driveways and fix any cracks. Remove anything that might make you trip as you walk through a door, such as a raised step or threshold. Trim any  bushes or trees on the path to your home. Use bright outdoor lighting. Clear any walking paths of anything that might make someone trip, such as rocks or tools. Regularly check to see if handrails are loose or broken. Make sure that both sides of any steps have handrails. Any raised decks and porches should have guardrails on the edges. Have any leaves, snow, or ice cleared regularly. Use sand or salt on walking paths during winter. Clean up any spills in your garage right away. This includes oil or grease spills. What can I do in the bathroom? Use night lights. Install grab bars by the toilet and in the tub and shower. Do not use towel bars as grab bars. Use non-skid mats or decals in the tub or shower. If you need to sit down in the shower, use a plastic, non-slip stool. Keep the floor dry. Clean up any water that spills on the floor as soon as it happens. Remove soap buildup in the tub or shower regularly. Attach bath mats securely with double-sided non-slip rug tape. Do not have throw rugs and  other things on the floor that can make you trip. What can I do in the bedroom? Use night lights. Make sure that you have a light by your bed that is easy to reach. Do not use any sheets or blankets that are too big for your bed. They should not hang down onto the floor. Have a firm chair that has side arms. You can use this for support while you get dressed. Do not have throw rugs and other things on the floor that can make you trip. What can I do in the kitchen? Clean up any spills right away. Avoid walking on wet floors. Keep items that you use a lot in easy-to-reach places. If you need to reach something above you, use a strong step stool that has a grab bar. Keep electrical cords out of the way. Do not use floor polish or wax that makes floors slippery. If you must use wax, use non-skid floor wax. Do not have throw rugs and other things on the floor that can make you trip. What can I do  with my stairs? Do not leave any items on the stairs. Make sure that there are handrails on both sides of the stairs and use them. Fix handrails that are broken or loose. Make sure that handrails are as long as the stairways. Check any carpeting to make sure that it is firmly attached to the stairs. Fix any carpet that is loose or worn. Avoid having throw rugs at the top or bottom of the stairs. If you do have throw rugs, attach them to the floor with carpet tape. Make sure that you have a light switch at the top of the stairs and the bottom of the stairs. If you do not have them, ask someone to add them for you. What else can I do to help prevent falls? Wear shoes that: Do not have high heels. Have rubber bottoms. Are comfortable and fit you well. Are closed at the toe. Do not wear sandals. If you use a stepladder: Make sure that it is fully opened. Do not climb a closed stepladder. Make sure that both sides of the stepladder are locked into place. Ask someone to hold it for you, if possible. Clearly mark and make sure that you can see: Any grab bars or handrails. First and last steps. Where the edge of each step is. Use tools that help you move around (mobility aids) if they are needed. These include: Canes. Walkers. Scooters. Crutches. Turn on the lights when you go into a dark area. Replace any light bulbs as soon as they burn out. Set up your furniture so you have a clear path. Avoid moving your furniture around. If any of your floors are uneven, fix them. If there are any pets around you, be aware of where they are. Review your medicines with your doctor. Some medicines can make you feel dizzy. This can increase your chance of falling. Ask your doctor what other things that you can do to help prevent falls. This information is not intended to replace advice given to you by your health care provider. Make sure you discuss any questions you have with your health care  provider. Document Released: 01/30/2009 Document Revised: 09/11/2015 Document Reviewed: 05/10/2014 Elsevier Interactive Patient Education  2017 [employer].

## 2021-10-16 NOTE — Progress Notes (Signed)
Subjective:   Angel Irwin is a 71 y.o. female who presents for Medicare Annual (Subsequent) preventive examination.  Review of Systems     Cardiac Risk Factors include: advanced age (>87mn, >>60women)     Objective:    Today's Vitals   10/16/21 0921 10/16/21 0930  BP: 122/62   Pulse: (!) 58   Temp: (!) 97.5 F (36.4 C)   TempSrc: Oral   SpO2: 97%   Weight: 153 lb 9.6 oz (69.7 kg)   Height: '5\' 1"'  (1.549 m)   PainSc:  0-No pain   Body mass index is 29.02 kg/m.     10/16/2021    9:40 AM 10/14/2020    1:56 PM 10/01/2019    7:03 AM 09/28/2019    8:34 AM 07/29/2017    8:44 AM 07/23/2016    7:35 PM 01/01/2015    1:43 PM  Advanced Directives  Does Patient Have a Medical Advance Directive? No Yes No No Yes No No  Type of ACorporate treasurerof AM[place]antonLiving will       Copy of [place] Chart?  No - copy requested       Would patient like information on creating a medical advance directive? No - Patient declined   Yes (MAU/Ambulatory/Procedural Areas - Information given)  No - Patient declined     Current Medications (verified) Outpatient Encounter Medications as of 10/16/2021  Medication Sig   albuterol (VENTOLIN HFA) 108 (90 Base) MCG/ACT inhaler Inhale 2 puffs into the lungs every 6 (six) hours as needed for wheezing or shortness of breath.   CALCIUM PO Take by mouth.   Misc Natural Products (YUMVS BEET ROOT-TART CHERRY) 250-0.5 MG CHEW Chew by mouth.   Na Sulfate-K Sulfate-Mg Sulf 17.5-3.13-1.6 GM/177ML SOLN Take 1 kit by mouth as directed. May use generic SUPREP;NO prior authorizations will be done.Please use Singlecare or GOOD-RX coupon.   VITAMIN D PO Take 6,000 Units by mouth daily. With K2 100 mcg   No facility-administered encounter medications on file as of 10/16/2021.    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   Iron deficiency anemia due to chronic blood loss 07/23/2014   Vitamin D deficiency 08/26/2016   Past Surgical History:  Procedure Laterality Date   ANTERIOR AND POSTERIOR REPAIR  02/22/2011   Procedure: ANTERIOR (CYSTOCELE) AND POSTERIOR REPAIR (RECTOCELE);  Surgeon: ADelice Lesch MD;  [facility];  Service: Gynecology;  Laterality: N/A;   COLONOSCOPY  06/13/2014   CYSTOSCOPY  02/22/2011   Procedure: CYSTOSCOPY;  Surgeon: ADelice Lesch MD;  [facility];  Service: Gynecology;  Laterality: N/A;   DILATION AND CURETTAGE OF UTERUS  04/19/1978   EXTERNAL FIXATION LEG Left 07/24/2016   Procedure: EXTERNAL FIXATION LEFT LOWER LEG;  Surgeon: BRod Can MD;  Location:  [facility];  Service: Orthopedics;  Laterality: Left;   HARDWARE REMOVAL Left 10/01/2019   Procedure: HARDWARE REMOVAL TIBIA;  Surgeon: Altamese Kis[place] Mills, MD;  Location: [facility];  Service: Orthopedics;  Laterality: Left;   ORIF TIBIA PLATEAU Left 07/27/2016   Procedure: OPEN REDUCTION INTERNAL FIXATION (ORIF) TIBIAL PLATEAU;  Surgeon: Altamese [place], MD;  Location: [facility];  Service: Orthopedics;  Laterality: Left;   SALPINGOOPHORECTOMY  02/22/2011   Procedure: SALPINGO OOPHERECTOMY;  Surgeon: Delice Lesch, MD;  Location: [facility];  Service: Gynecology;  Laterality: Bilateral;   UPPER GASTROINTESTINAL ENDOSCOPY  06/13/2014   VAGINAL HYSTERECTOMY  02/22/2011   Procedure: HYSTERECTOMY VAGINAL;  Surgeon: Delice Lesch, MD;  Location: [facility];   Service: Gynecology;  Laterality: N/A;   WISDOM TOOTH EXTRACTION     Family History  Problem Relation Age of Onset   Diabetes Father    Lung cancer Sister 43   Diabetes Mellitus I Sister    Heart disease Maternal Grandmother    Diabetes Maternal Grandmother    Heart disease Maternal Grandfather    Kidney disease Maternal Grandfather    Other Mother 50       murdered   Other Brother 55       murdered   Diabetes Paternal Grandmother    Brain cancer Paternal Grandfather    Diabetes Sister    Colon cancer Neg Hx    Colon polyps Neg Hx    Esophageal cancer Neg Hx    Gallbladder disease Neg Hx    Breast cancer Neg Hx    Social History   Socioeconomic History   Marital status: Single    Spouse name: Not on file   Number of children: 4   Years of education: Not on file   Highest education level: Not on file  Occupational History   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  Tobacco Use   Smoking status: Never   Smokeless tobacco: Never  Vaping Use   Vaping Use: Never used  Substance and Sexual Activity   Alcohol use: Not Currently    Comment: Rarely   Drug use: No   Sexual activity: Not on file  Other Topics Concern   Not on file  Social History Narrative   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, Insurance underwriter      Home Situation: lives alone      Spiritual Beliefs: Jahovah witness      Lifestyle: no regular exercise; trying to eat healthy      Social Determinants of Health   Financial Resource Strain: Low Risk  (10/16/2021)   Overall Financial Resource Strain (CARDIA)    Difficulty of Paying Living Expenses: Not hard at all  Food Insecurity: No Food Insecurity (10/16/2021)   Hunger Vital Sign    Worried About Running Out of Food in the Last Year: Never true    [place] in the Last Year: Never true  Transportation Needs: No Transportation Needs (10/16/2021)   PRAPARE - Hydrologist (Medical): No    Lack of Transportation (Non-Medical):  No  Physical Activity: Sufficiently Active (10/16/2021)   Exercise Vital Sign    Days of Exercise per Week: 5 days    Minutes of Exercise per Session: 30 min  Stress: No Stress Concern Present (10/16/2021)   [place]    Feeling of Stress : Not at all  Social Connections: Socially Isolated (10/16/2021)   Social Connection and Isolation Panel [  NHANES]    Frequency of Communication with Friends and Family: More than three times a week    Frequency of Social Gatherings with Friends and Family: More than three times a week    Attends Religious Services: Never    Marine scientist or Organizations: No    Attends Archivist Meetings: Never    Marital Status: Separated     Clinical Intake:  Diabetic?  No  Activities of Daily Living    10/16/2021    9:37 AM  In your present state of health, do you have any difficulty performing the following activities:  Hearing? 0  Vision? 0  Difficulty concentrating or making decisions? 0  Walking or climbing stairs? 0  Dressing or bathing? 0  Doing errands, shopping? 0  Preparing Food and eating ? N  Using the Toilet? N  In the past six months, have you accidently leaked urine? N  Do you have problems with loss of bowel control? N  Managing your Medications? N  Managing your Finances? N  Housekeeping or managing your Housekeeping? N    Patient Care Team: Caren Macadam, MD (Inactive) as PCP - General (Family Medicine)  Indicate any recent Medical Services you may have received from other than [facility] providers in the past year (date may be approximate).     Assessment:   This is a routine wellness examination for Cedra.  Hearing/Vision screen Hearing Screening - Comments:: No hearing difficulty Vision Screening - Comments:: Wears glasses. Followed by Maudry Mayhew  Dietary issues and exercise activities discussed: Exercise limited by: None identified    Goals Addressed               This Visit's Progress     Lose weight (pt-stated)        I would like to lose a little more weight.       Depression Screen    10/16/2021    9:35 AM 10/14/2020    1:58 PM 10/14/2020    1:54 PM 06/06/2020    2:59 PM 10/19/2018    2:26 PM 09/08/2017    7:21 AM 07/29/2017    8:45 AM  PHQ 2/9 Scores  PHQ - 2 Score 0 0 0 0 0 0 0  PHQ- 9 Score     1      Fall Risk    10/16/2021    9:39 AM 10/14/2020    1:57 PM 06/06/2020    2:59 PM 10/19/2018    2:26 PM 09/08/2017    7:21 AM  [place] in the past year? 0 0 1 0 No  Number falls in past yr: 0 0 0 0   Injury with Fall? 0 0 1 0   Risk for fall due to : No Fall Risks        FALL RISK PREVENTION PERTAINING TO THE HOME:  Any stairs in or around the home? Yes  If so, are there any without handrails? No  Home free of loose throw rugs in walkways, pet beds, electrical cords, etc? Yes  Adequate lighting in your home to reduce risk of falls? Yes   ASSISTIVE DEVICES UTILIZED TO PREVENT FALLS:  Life alert? No  Use of a cane, walker or w/c? No  Grab bars in the bathroom? No  Shower chair or bench in shower? No  Elevated toilet seat or a handicapped toilet? No   TIMED UP AND GO:  Was the test performed? Yes .  Length of time to ambulate 10 feet: 5 sec.   Gait steady and fast without use of assistive device  Cognitive Function:        10/16/2021    9:40 AM  6CIT Screen  What Year? 0 points  What month? 0 points  What time? 0 points  Count back from 20 0 points  Months in reverse 0 points  Repeat phrase 0 points  Total Score 0 points    Immunizations Immunization History  Administered Date(s) Administered   Fluad Quad(high Dose 65+) 06/06/2020   Influenza, High Dose Seasonal PF 02/19/2016, 03/09/2018   Influenza,inj,Quad PF,6+ Mos 05/04/2013, 04/08/2014, 05/28/2015   PFIZER(Purple Top)SARS-COV-2 Vaccination 05/21/2019, 06/18/2019   Pneumococcal Conjugate-13 02/19/2016    Pneumococcal Polysaccharide-23 09/08/2017   Tdap 05/04/2013    TDAP status: Up to date  Flu Vaccine status: Up to date  Pneumococcal vaccine status: Up to date  Covid-19 vaccine status: Completed vaccines  Qualifies for Shingles Vaccine? Yes   Zostavax completed Yes    Screening Tests Health Maintenance  Topic Date Due   COVID-19 Vaccine (3 - Pfizer series) 11/01/2021 (Originally 08/13/2019)   Zoster Vaccines- Shingrix (1 of 2) 01/16/2022 (Originally 01/03/2001)   COLONOSCOPY (Pts 45-75yr Insurance coverage will need to be confirmed)  10/17/2022 (Originally 06/14/2019)   INFLUENZA VACCINE  11/17/2021   MAMMOGRAM  11/11/2022   TETANUS/TDAP  05/05/2023   Pneumonia Vaccine 71 Years old  Completed   DEXA SCAN  Completed   Hepatitis C Screening  Completed   HPV VACCINES  Aged Out    Health Maintenance  There are no preventive care reminders to display for this patient.   Colorectal cancer screening: Referral to GI placed Scheduled 10/21/21. Pt aware the office will call re: appt.  Mammogram status: Completed 11/10/20. Repeat every year  Bone Density status: Completed 11/10/20. Results reflect: Bone density results: OSTEOPOROSIS. Repeat every   years.  Lung Cancer Screening: (Low Dose CT Chest recommended if Age 143-80years, 30 pack-year currently smoking OR have quit w/in 15years.) does not qualify.     Additional Screening:  Hepatitis C Screening: does qualify; Completed 05/28/15  Vision Screening: Recommended annual ophthalmology exams for early detection of glaucoma and other disorders of the eye. Is the patient up to date with their annual eye exam?  Yes  Who is the provider or what is the name of the office in which the patient attends annual eye exams? [facility] is not established with a provider, would they like to be referred to a provider to establish care? No .   Dental Screening: Recommended annual dental exams for proper oral hygiene  Community Resource  Referral / Chronic Care Management:  CRR required this visit?  No   CCM required this visit?  No      Plan:     I have personally reviewed and noted the following in the patient's chart:   Medical and social history Use of alcohol, tobacco or illicit drugs  Current medications and supplements including opioid prescriptions.  Functional ability and status Nutritional status Physical activity Advanced directives List of other physicians Hospitalizations, surgeries, and ER visits in previous 12 months Vitals Screenings to include cognitive, depression, and falls Referrals and appointments  In addition, I have reviewed and discussed with patient certain preventive protocols, quality metrics, and best practice recommendations. A written personalized care plan for preventive services as well as general preventive health recommendations were provided to patient.     BNino Parsley  Redmond Pulling, [place]   3/75/4360   Nurse Notes: None

## 2021-10-19 ENCOUNTER — Ambulatory Visit (INDEPENDENT_AMBULATORY_CARE_PROVIDER_SITE_OTHER): Payer: No Typology Code available for payment source | Admitting: Family

## 2021-10-19 VITALS — BP 122/68 | HR 54 | Temp 97.4°F | Ht 61.0 in | Wt 155.1 lb

## 2021-10-19 DIAGNOSIS — D500 Iron deficiency anemia secondary to blood loss (chronic): Secondary | ICD-10-CM | POA: Diagnosis not present

## 2021-10-19 DIAGNOSIS — Z1231 Encounter for screening mammogram for malignant neoplasm of breast: Secondary | ICD-10-CM

## 2021-10-19 DIAGNOSIS — E559 Vitamin D deficiency, unspecified: Secondary | ICD-10-CM | POA: Diagnosis not present

## 2021-10-19 DIAGNOSIS — Z0000 Encounter for general adult medical examination without abnormal findings: Secondary | ICD-10-CM

## 2021-10-19 LAB — CBC WITH DIFFERENTIAL/PLATELET
Basophils Absolute: 0.1 10*3/uL (ref 0.0–0.1)
Basophils Relative: 0.8 % (ref 0.0–3.0)
Eosinophils Absolute: 0.2 10*3/uL (ref 0.0–0.7)
Eosinophils Relative: 3 % (ref 0.0–5.0)
HCT: 39.8 % (ref 36.0–46.0)
Hemoglobin: 13.3 g/dL (ref 12.0–15.0)
Lymphocytes Relative: 28 % (ref 12.0–46.0)
Lymphs Abs: 1.9 10*3/uL (ref 0.7–4.0)
MCHC: 33.5 g/dL (ref 30.0–36.0)
MCV: 92.6 fl (ref 78.0–100.0)
Monocytes Absolute: 0.5 10*3/uL (ref 0.1–1.0)
Monocytes Relative: 7.2 % (ref 3.0–12.0)
Neutro Abs: 4.1 10*3/uL (ref 1.4–7.7)
Neutrophils Relative %: 61 % (ref 43.0–77.0)
Platelets: 236 10*3/uL (ref 150.0–400.0)
RBC: 4.3 Mil/uL (ref 3.87–5.11)
RDW: 13.1 % (ref 11.5–15.5)
WBC: 6.7 10*3/uL (ref 4.0–10.5)

## 2021-10-19 LAB — COMPREHENSIVE METABOLIC PANEL
ALT: 16 U/L (ref 0–35)
AST: 17 U/L (ref 0–37)
Albumin: 4.1 g/dL (ref 3.5–5.2)
Alkaline Phosphatase: 85 U/L (ref 39–117)
BUN: 12 mg/dL (ref 6–23)
CO2: 27 mEq/L (ref 19–32)
Calcium: 10 mg/dL (ref 8.4–10.5)
Chloride: 103 mEq/L (ref 96–112)
Creatinine, Ser: 0.66 mg/dL (ref 0.40–1.20)
GFR: 88.65 mL/min (ref >60.00)
Glucose, Bld: 81 mg/dL (ref 70–99)
Potassium: 3.9 mEq/L (ref 3.5–5.1)
Sodium: 136 mEq/L (ref 135–145)
Total Bilirubin: 0.7 mg/dL (ref 0.2–1.2)
Total Protein: 7.1 g/dL (ref 6.0–8.3)

## 2021-10-19 LAB — LIPID PANEL
Cholesterol: 188 mg/dL (ref 0–200)
HDL: 37.4 mg/dL — ABNORMAL LOW (ref >39.00)
LDL Cholesterol: 122 mg/dL — ABNORMAL HIGH (ref 0–99)
NonHDL: 150.51
Total CHOL/HDL Ratio: 5
Triglycerides: 145 mg/dL (ref 0.0–149.0)
VLDL: 29 mg/dL (ref 0.0–40.0)

## 2021-10-19 LAB — TSH: TSH: 3.37 u[IU]/mL (ref 0.35–5.50)

## 2021-10-19 LAB — VITAMIN D 25 HYDROXY (VIT D DEFICIENCY, FRACTURES): VITD: 30.81 ng/mL (ref 30.00–100.00)

## 2021-10-19 NOTE — Progress Notes (Signed)
Complete physical exam  Patient: Angel Irwin   DOB: [birth year]   71 y.o. Female  MRN: [id number]  Subjective:    Chief Complaint  Patient presents with  . Annual Exam    Angel Irwin is a 71 y.o. female who presents today for a complete physical exam. She reports consuming a general diet. The patient does not participate in regular exercise at present. However she bowls on a regular basis with a league. She generally feels well. She reports sleeping well. She does not have additional problems to discuss today.    Most recent fall risk assessment:    10/16/2021    9:39 AM  [place] in the past year? 0  Number falls in past yr: 0  Injury with Fall? 0  Risk for fall due to : No Fall Risks     Most recent depression screenings:    10/16/2021    9:35 AM 10/14/2020    1:58 PM  PHQ 2/9 Scores  PHQ - 2 Score 0 0    Dental: No current dental problems and No regular dental care   Patient Active Problem List   Diagnosis Date Noted  . Osteoporosis 09/13/2018  . Hemangioma of liver 03/09/2018  . Vitamin D deficiency 08/26/2016  . Fall 08/26/2016  . Hiatal hernia 09/04/2014  . 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 Iron deficiency anemia due to chronic blood loss 07/23/2014  . Asthma, chronic 04/08/2014   Past Medical History:  Diagnosis Date  . Adenomatous colon polyp 09/04/2014  . Asthma    albuterol-rescue inhaler-uses 1-2 x year  . Bone disease, metabolic 8/93/7342  . [name] lesion, chronic    on sm bowel capsule 06/2014  . Closed fracture of lateral portion of left tibial plateau 07/23/2016  . Complication of anesthesia    slow to awaken  . Fundic gland polyps of stomach, benign   . GERD (gastroesophageal reflux disease)    no meds  . Hiatal hernia   . History of kidney stones 09/2017  . 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 Iron deficiency anemia due to chronic blood loss 07/23/2014  . Vitamin D  deficiency 08/26/2016      Patient Care Team: Caren Macadam, MD (Inactive) as PCP - General (Family Medicine)   Outpatient Medications Prior to Visit  Medication Sig  . albuterol (VENTOLIN HFA) 108 (90 Base) MCG/ACT inhaler Inhale 2 puffs into the lungs every 6 (six) hours as needed for wheezing or shortness of breath.  Marland Kitchen CALCIUM PO Take by mouth.  . Misc Natural Products (YUMVS BEET ROOT-TART CHERRY) 250-0.5 MG CHEW Chew by mouth.  . Na Sulfate-K Sulfate-Mg Sulf 17.5-3.13-1.6 GM/177ML SOLN Take 1 kit by mouth as directed. May use generic SUPREP;NO prior authorizations will be done.Please use Singlecare or GOOD-RX coupon.  Marland Kitchen VITAMIN D PO Take 6,000 Units by mouth daily. With K2 100 mcg   No facility-administered medications prior to visit.    Review of Systems  All other systems reviewed and are negative.        Objective:     BP 122/68 (BP Location: Left Arm, Patient Position: Sitting, Cuff Size: Normal)   Pulse (!) 54   Temp (!) 97.4 F (36.3 C) (Oral)   Ht '5\' 1"'  (1.549 m)   Wt 155 lb 1.6 oz (70.4 kg)   SpO2 98%   BMI 29.31 kg/m    Physical Exam Vitals and  nursing note reviewed.  Constitutional:      Appearance: Normal appearance. She is normal weight.  HENT:     Head: Normocephalic and atraumatic.     Right Ear: Tympanic membrane, ear canal and external ear normal.     Left Ear: Tympanic membrane, ear canal and external ear normal.     Nose: Nose normal.     Mouth/Throat:     Mouth: Mucous membranes are moist.  Eyes:     Extraocular Movements: Extraocular movements intact.     Conjunctiva/sclera: Conjunctivae normal.     Pupils: Pupils are equal, round, and reactive to light.  Cardiovascular:     Rate and Rhythm: Normal rate and regular rhythm.     Heart sounds: Normal heart sounds.  Pulmonary:     Effort: Pulmonary effort is normal.     Breath sounds: Normal breath sounds.  Abdominal:     General: Abdomen is flat.     Palpations: Abdomen is soft.   Musculoskeletal:        General: Normal range of motion.     Cervical back: Normal range of motion and neck supple.  Skin:    General: Skin is warm and dry.  Neurological:     General: No focal deficit present.     Mental Status: She is alert and oriented to person, place, and time.  Psychiatric:        Mood and Affect: Mood normal.        Behavior: Behavior normal.     No results found for any visits on 10/19/21.     Assessment & Plan:    Routine Health Maintenance and Physical Exam  Immunization History  Administered Date(s) Administered  . Fluad Quad(high Dose 65+) 06/06/2020  . Influenza, High Dose Seasonal PF 02/19/2016, 03/09/2018  . Influenza,inj,Quad PF,6+ Mos 05/04/2013, 04/08/2014, 05/28/2015  . PFIZER(Purple Top)SARS-COV-2 Vaccination 05/21/2019, 06/18/2019  . Pneumococcal Conjugate-13 02/19/2016  . Pneumococcal Polysaccharide-23 09/08/2017  . Tdap 05/04/2013    Health Maintenance  Topic Date Due  . COVID-19 Vaccine (3 - Pfizer series) 11/01/2021 (Originally 08/13/2019)  . Zoster Vaccines- Shingrix (1 of 2) 01/16/2022 (Originally 01/03/2001)  . COLONOSCOPY (Pts 45-29yr Insurance coverage will need to be confirmed)  10/17/2022 (Originally 06/14/2019)  . INFLUENZA VACCINE  11/17/2021  . MAMMOGRAM  11/11/2022  . TETANUS/TDAP  05/05/2023  . Pneumonia Vaccine 71 Years old  Completed  . DEXA SCAN  Completed  . Hepatitis C Screening  Completed  . HPV VACCINES  Aged Out    Discussed health benefits of physical activity, and encouraged her to engage in regular exercise appropriate for her age and condition.  Problem List Items Addressed This Visit     Vitamin D deficiency (Chronic)   Relevant Orders   Vitamin D, 25-hydroxy   Iron deficiency anemia due to chronic blood loss   Relevant Orders   TSH   Other Visit Diagnoses     Encounter for screening mammogram for malignant neoplasm of breast    -  Primary   Relevant Orders   MM DIGITAL SCREENING BILATERAL    Routine general medical examination at a health care facility       Relevant Orders   CBC with Differential   Comprehensive metabolic panel   Lipid panel   TSH      Return in 1 year (on 10/20/2022).     PKennyth Arnold FNP

## 2021-10-19 NOTE — Patient Instructions (Signed)
Health Maintenance, Female Adopting a healthy lifestyle and getting preventive care are important in promoting health and wellness. Ask your health care provider about: The right schedule for you to have regular tests and exams. Things you can do on your own to prevent diseases and keep yourself healthy. What should I know about diet, weight, and exercise? Eat a healthy diet  Eat a diet that includes plenty of vegetables, fruits, low-fat dairy products, and lean protein. Do not eat a lot of foods that are high in solid fats, added sugars, or sodium. Maintain a healthy weight Body mass index (BMI) is used to identify weight problems. It estimates body fat based on height and weight. Your health care provider can help determine your BMI and help you achieve or maintain a healthy weight. Get regular exercise Get regular exercise. This is one of the most important things you can do for your health. Most adults should: Exercise for at least 150 minutes each week. The exercise should increase your heart rate and make you sweat (moderate-intensity exercise). Do strengthening exercises at least twice a week. This is in addition to the moderate-intensity exercise. Spend less time sitting. Even light physical activity can be beneficial. Watch cholesterol and blood lipids Have your blood tested for lipids and cholesterol at 71 years of age, then have this test every 5 years. Have your cholesterol levels checked more often if: Your lipid or cholesterol levels are high. You are older than 71 years of age. You are at high risk for heart disease. What should I know about cancer screening? Depending on your health history and family history, you may need to have cancer screening at various ages. This may include screening for: Breast cancer. Cervical cancer. Colorectal cancer. Skin cancer. Lung cancer. What should I know about heart disease, diabetes, and high blood pressure? Blood pressure and heart  disease High blood pressure causes heart disease and increases the risk of stroke. This is more likely to develop in people who have high blood pressure readings or are overweight. Have your blood pressure checked: Every 3-5 years if you are 18-39 years of age. Every year if you are 40 years old or older. Diabetes Have regular diabetes screenings. This checks your fasting blood sugar level. Have the screening done: Once every three years after age 40 if you are at a normal weight and have a low risk for diabetes. More often and at a younger age if you are overweight or have a high risk for diabetes. What should I know about preventing infection? Hepatitis B If you have a higher risk for hepatitis B, you should be screened for this virus. Talk with your health care provider to find out if you are at risk for hepatitis B infection. Hepatitis C Testing is recommended for: Everyone born from 1945 through 1965. Anyone with known risk factors for hepatitis C. Sexually transmitted infections (STIs) Get screened for STIs, including gonorrhea and chlamydia, if: You are sexually active and are younger than 71 years of age. You are older than 71 years of age and your health care provider tells you that you are at risk for this type of infection. Your sexual activity has changed since you were last screened, and you are at increased risk for chlamydia or gonorrhea. Ask your health care provider if you are at risk. Ask your health care provider about whether you are at high risk for HIV. Your health care provider may recommend a prescription medicine to help prevent HIV   infection. If you choose to take medicine to prevent HIV, you should first get tested for HIV. You should then be tested every 3 months for as long as you are taking the medicine. Pregnancy If you are about to stop having your period (premenopausal) and you may become pregnant, seek counseling before you get pregnant. Take 400 to 800  micrograms (mcg) of folic acid every day if you become pregnant. Ask for birth control (contraception) if you want to prevent pregnancy. Osteoporosis and menopause Osteoporosis is a disease in which the bones lose minerals and strength with aging. This can result in bone fractures. If you are 65 years old or older, or if you are at risk for osteoporosis and fractures, ask your health care provider if you should: Be screened for bone loss. Take a calcium or vitamin D supplement to lower your risk of fractures. Be given hormone replacement therapy (HRT) to treat symptoms of menopause. Follow these instructions at home: Alcohol use Do not drink alcohol if: Your health care provider tells you not to drink. You are pregnant, may be pregnant, or are planning to become pregnant. If you drink alcohol: Limit how much you have to: 0-1 drink a day. Know how much alcohol is in your drink. In the [place] one drink equals one 12 oz bottle of beer (355 mL), one 5 oz glass of wine (148 mL), or one 1 oz glass of hard liquor (44 mL). Lifestyle Do not use any products that contain nicotine or tobacco. These products include cigarettes, chewing tobacco, and vaping devices, such as e-cigarettes. If you need help quitting, ask your health care provider. Do not use street drugs. Do not share needles. Ask your health care provider for help if you need support or information about quitting drugs. General instructions Schedule regular health, dental, and eye exams. Stay current with your vaccines. Tell your health care provider if: You often feel depressed. You have ever been abused or do not feel safe at home. Summary Adopting a healthy lifestyle and getting preventive care are important in promoting health and wellness. Follow your health care provider's instructions about healthy diet, exercising, and getting tested or screened for diseases. Follow your health care provider's instructions on monitoring your  cholesterol and blood pressure. This information is not intended to replace advice given to you by your health care provider. Make sure you discuss any questions you have with your health care provider. Document Revised: 08/25/2020 Document Reviewed: 08/25/2020 Elsevier Patient Education  2023 Elsevier Inc.  Fat and Cholesterol Restricted Eating Plan Eating a diet that limits fat and cholesterol may help lower your risk for heart disease and other conditions. Your body needs fat and cholesterol for basic functions, but eating too much of these things can be harmful to your health. Your health care provider may order lab tests to check your blood fat (lipid) and cholesterol levels. This helps your health care provider understand your risk for certain conditions and whether you need to make diet changes. Work with your health care provider or dietitian to make an eating plan that is right for you. Your plan includes: Limit your fat intake to ______% or less of your total calories a day. This is ______g of fat per day. Limit your saturated fat intake to ______% or less of your total calories a day. This is ______g of saturated fat per day. Limit the amount of cholesterol in your diet to less than _________mg a day. Eat ___________ g of fiber   a day. What are tips for following this plan? General guidelines If you are overweight, work with your health care provider to lose weight safely. Losing just 5-10% of your body weight can improve your overall health and help prevent diseases such as diabetes and heart disease. Avoid: Foods with added sugar. Fried foods. Foods that contain partially hydrogenated oils, including stick margarine, some tub margarines, cookies, crackers, and other baked goods. If you drink alcohol: Limit how much you have to: 0-1 drink a day for women who are not pregnant. 0-2 drinks a day for men. Know how much alcohol is in a drink. In the [place] one drink equals one 12 oz  bottle of beer (355 mL), one 5 oz glass of wine (148 mL), or one 1 oz glass of hard liquor (44 mL). Reading food labels Check food labels for: Trans fats or partially hydrogenated oils. Avoid foods that contain these. High amounts of saturated fat. Choose foods that are low in saturated fat (less than 2 g). The amount of cholesterol in each serving. The amount of fiber in each serving. Choose foods with healthy fats, such as: Monounsaturated and polyunsaturated fats. These include olive and canola oil, flaxseeds, walnuts, almonds, and seeds. Omega-3 fats. These are found in foods such as salmon, mackerel, sardines, tuna, flaxseed oil, and ground flaxseeds. Choose grain products that have whole grains. Look for the word "whole" as the first word in the ingredient list. Cooking Cook foods using methods other than frying. Baking, boiling, grilling, and broiling are some healthy options. Eat more home-cooked food and less restaurant, buffet, and fast food. Avoid cooking using saturated fats. Animal sources of saturated fats include meats, butter, and cream. Plant sources of saturated fats include palm oil, palm kernel oil, and coconut oil. Meal planning  At meals, imagine dividing your plate into fourths: Fill one-half of your plate with vegetables, green salads, and fruit. Fill one-fourth of your plate with whole grains. Fill one-fourth of your plate with lean protein foods. Eat fish that is high in omega-3 fats at least two times a week. Eat more foods that contain fiber, such as whole grains, beans, apples, pears, berries, broccoli, carrots, peas, and barley. These foods help promote healthy cholesterol levels in the blood. What foods should I eat? Fruits All fresh, canned (in natural juice), or frozen fruits. Vegetables Fresh or frozen vegetables (raw, steamed, roasted, or grilled). Green salads. Grains Whole grains, such as whole wheat or whole grain breads, crackers, cereals, and  pasta. Unsweetened oatmeal, bulgur, barley, quinoa, or brown rice. Corn or whole wheat flour tortillas. Meats and other proteins Ground beef (85% or leaner), grass-fed beef, or beef trimmed of fat. Skinless chicken or turkey. Ground chicken or turkey. Pork trimmed of fat. All fish and seafood. Egg whites. Dried beans, peas, or lentils. Unsalted nuts or seeds. Unsalted canned beans. Natural nut butters without added sugar and oil. Dairy Low-fat or nonfat dairy products, such as skim or 1% milk, 2% or reduced-fat cheeses, low-fat and fat-free ricotta or cottage cheese, or plain low-fat and nonfat yogurt. Fats and oils Tub margarine without trans fats. Light or reduced-fat mayonnaise and salad dressings. Avocado. Olive, canola, sesame, or safflower oils. The items listed above may not be a complete list of foods and beverages you can eat. Contact a dietitian for more information. What foods should I avoid? Fruits Canned fruit in heavy syrup. Fruit in cream or butter sauce. Fried fruit. Vegetables Vegetables cooked in cheese, cream, or butter sauce.   Fried vegetables. Grains White bread. White pasta. White rice. Cornbread. Bagels, pastries, and croissants. Crackers and snack foods that contain trans fat and hydrogenated oils. Meats and other proteins Fatty cuts of meat. Ribs, chicken wings, bacon, sausage, bologna, salami, chitterlings, fatback, hot dogs, bratwurst, and packaged lunch meats. Liver and organ meats. Whole eggs and egg yolks. Chicken and turkey with skin. Fried meat. Dairy Whole or 2% milk, cream, half-and-half, and cream cheese. Whole milk cheeses. Whole-fat or sweetened yogurt. Full-fat cheeses. Nondairy creamers and whipped toppings. Processed cheese, cheese spreads, and cheese curds. Fats and oils Butter, stick margarine, lard, shortening, ghee, or bacon fat. Coconut, palm kernel, and palm oils. Beverages Alcohol. Sugar-sweetened drinks such as sodas, lemonade, and fruit  drinks. Sweets and desserts Corn syrup, sugars, honey, and molasses. Candy. Jam and jelly. Syrup. Sweetened cereals. Cookies, pies, cakes, donuts, muffins, and ice cream. The items listed above may not be a complete list of foods and beverages you should avoid. Contact a dietitian for more information. Summary Your body needs fat and cholesterol for basic functions. However, eating too much of these things can be harmful to your health. Work with your health care provider and dietitian to follow a diet that limits fat and cholesterol. Doing this may help lower your risk for heart disease and other conditions. Choose healthy fats, such as monounsaturated and polyunsaturated fats, and foods high in omega-3 fatty acids. Eat fiber-rich foods, such as whole grains, beans, peas, fruits, and vegetables. Limit or avoid alcohol, fried foods, and foods high in saturated fats, partially hydrogenated oils, and sugar. This information is not intended to replace advice given to you by your health care provider. Make sure you discuss any questions you have with your health care provider. Document Revised: 08/15/2020 Document Reviewed: 08/15/2020 Elsevier Patient Education  2023 Elsevier Inc.  

## 2021-10-21 ENCOUNTER — Encounter: Payer: Self-pay | Admitting: Internal Medicine

## 2021-10-21 ENCOUNTER — Ambulatory Visit (AMBULATORY_SURGERY_CENTER): Payer: No Typology Code available for payment source | Admitting: Internal Medicine

## 2021-10-21 VITALS — BP 106/49 | HR 48 | Temp 98.0°F | Resp 15 | Ht 61.0 in | Wt 150.0 lb

## 2021-10-21 DIAGNOSIS — Z09 Encounter for follow-up examination after completed treatment for conditions other than malignant neoplasm: Secondary | ICD-10-CM | POA: Diagnosis not present

## 2021-10-21 DIAGNOSIS — E669 Obesity, unspecified: Secondary | ICD-10-CM | POA: Diagnosis not present

## 2021-10-21 DIAGNOSIS — J45909 Unspecified asthma, uncomplicated: Secondary | ICD-10-CM | POA: Diagnosis not present

## 2021-10-21 DIAGNOSIS — Z86010 Personal history of colonic polyps: Secondary | ICD-10-CM

## 2021-10-21 MED ORDER — SODIUM CHLORIDE 0.9 % IV SOLN: 500.0000 mL | Freq: Once | INTRAVENOUS | Status: DC

## 2021-10-21 NOTE — Progress Notes (Signed)
Report to PACU, RN, vss, BBS= Clear.  

## 2021-10-21 NOTE — Op Note (Signed)
[facility] Patient Name: Angel Irwin Procedure Date: 10/21/2021 10:47 AM MRN: [id number] Endoscopist: Jerene Bears , MD Age: 71 Referring MD:  Date of Birth: 26-Oct-1950 Gender: Female Account #: [id number] Procedure:                Colonoscopy Indications:              High risk colon cancer surveillance: Personal                            history of non-advanced adenoma, Last colonoscopy:                            February 2016 Medicines:                Monitored Anesthesia Care Procedure:                Pre-Anesthesia Assessment:                           - Prior to the procedure, a History and Physical                            was performed, and patient medications and                            allergies were reviewed. The patient's tolerance of                            previous anesthesia was also reviewed. The risks                            and benefits of the procedure and the sedation                            options and risks were discussed with the patient.                            All questions were answered, and informed consent                            was obtained. Prior Anticoagulants: The patient has                            taken no previous anticoagulant or antiplatelet                            agents. ASA Grade Assessment: II - A patient with                            mild systemic disease. After reviewing the risks                            and benefits, the patient was deemed in  satisfactory condition to undergo the procedure.                           After obtaining informed consent, the colonoscope                            was passed under direct vision. Throughout the                            procedure, the patient's blood pressure, pulse, and                            oxygen saturations were monitored continuously. The                            Olympus CF-HQ190L [id number]) Colonoscope was                             introduced through the anus and advanced to the                            cecum, identified by appendiceal orifice and                            ileocecal valve. The colonoscopy was performed                            without difficulty. The patient tolerated the                            procedure well. The quality of the bowel                            preparation was good. The ileocecal valve,                            appendiceal orifice, and rectum were photographed. Scope In: 10:52:53 AM Scope Out: 11:07:54 AM Scope Withdrawal Time: 0 hours 10 minutes 36 seconds  Total Procedure Duration: 0 hours 15 minutes 1 second  Findings:                 The digital rectal exam was normal.                           Multiple small and large-mouthed diverticula were                            found in the sigmoid colon, descending colon and                            ascending colon.                           The exam was otherwise without abnormality on  direct and retroflexion views. Complications:            No immediate complications. Estimated Blood Loss:     Estimated blood loss: none. Impression:               - Moderate diverticulosis in the sigmoid colon, in                            the descending colon and in the ascending colon.                           - The examination was otherwise normal on direct                            and retroflexion views.                           - No specimens collected. Recommendation:           - Patient has a contact number available for                            emergencies. The signs and symptoms of potential                            delayed complications were discussed with the                            patient. Return to normal activities tomorrow.                            Written discharge instructions were provided to the                            patient.                            - Resume previous diet.                           - Continue present medications.                           - No repeat colonoscopy due to age at next                            surveillance interval and the absence of colonic                            polyps on today's exam. Jerene Bears, MD 10/21/2021 11:10:09 AM This report has been signed electronically.

## 2021-10-21 NOTE — Progress Notes (Signed)
GASTROENTEROLOGY PROCEDURE H&P NOTE   Primary Care Physician: Caren Macadam, MD (Inactive)    Reason for Procedure:  History of adenomatous colon polyp  Plan:    Surveillance colonoscopy  Patient is appropriate for endoscopic procedure(s) in the ambulatory ([place]) setting.  The nature of the procedure, as well as the risks, benefits, and alternatives were carefully and thoroughly reviewed with the patient. Ample time for discussion and questions allowed. The patient understood, was satisfied, and agreed to proceed.     HPI: Angel Irwin is a 71 y.o. female who presents for surveillance colonoscopy.  Medical history as below.  Tolerated the prep.  No recent chest pain or shortness of breath.  No abdominal pain today.  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   Iron deficiency anemia due to chronic blood loss 07/23/2014   Vitamin D deficiency 08/26/2016    Past Surgical History:  Procedure Laterality Date   ANTERIOR AND POSTERIOR REPAIR  02/22/2011   Procedure: ANTERIOR (CYSTOCELE) AND POSTERIOR REPAIR (RECTOCELE);  Surgeon: Delice Lesch, MD;  Location: [facility];  Service: Gynecology;  Laterality: N/A;   COLONOSCOPY  06/13/2014   CYSTOSCOPY  02/22/2011   Procedure: CYSTOSCOPY;  Surgeon: Delice Lesch, MD;  Location: [facility];  Service: Gynecology;  Laterality: N/A;   DILATION AND CURETTAGE OF UTERUS  04/19/1978   EXTERNAL FIXATION LEG Left  07/24/2016   Procedure: EXTERNAL FIXATION LEFT LOWER LEG;  Surgeon: Rod Can, MD;  Location: [facility];  Service: Orthopedics;  Laterality: Left;   HARDWARE REMOVAL Left 10/01/2019   Procedure: HARDWARE REMOVAL TIBIA;  Surgeon: Altamese Quinby, MD;  Location: [facility];  Service: Orthopedics;  Laterality: Left;   ORIF TIBIA PLATEAU Left 07/27/2016   Procedure: OPEN REDUCTION INTERNAL FIXATION (ORIF) TIBIAL PLATEAU;  Surgeon: Altamese Le Center, MD;  Location: [facility];  Service: Orthopedics;  Laterality: Left;   SALPINGOOPHORECTOMY  02/22/2011   Procedure: SALPINGO OOPHERECTOMY;  Surgeon: Delice Lesch, MD;  Location: [facility];  Service: Gynecology;  Laterality: Bilateral;   UPPER GASTROINTESTINAL ENDOSCOPY  06/13/2014   VAGINAL HYSTERECTOMY  02/22/2011   Procedure: HYSTERECTOMY VAGINAL;  Surgeon: Delice Lesch, MD;  Location: [facility];  Service: Gynecology;  Laterality: N/A;   WISDOM TOOTH EXTRACTION      Prior to Admission medications   Medication Sig Start Date End Date Taking? Authorizing Provider  CALCIUM PO Take by mouth.   Yes [provider]  Misc Natural Products (YUMVS BEET ROOT-TART CHERRY) 250-0.5 MG CHEW Chew by mouth.   Yes [provider]  VITAMIN D PO Take 6,000 Units by mouth daily. With K2 100 mcg   Yes [provider]  albuterol (VENTOLIN HFA) 108 (90 Base) MCG/ACT inhaler Inhale 2 puffs into the lungs every 6 (six) hours as needed for wheezing or shortness of breath. 06/06/20   Caren Macadam, MD  Current Outpatient Medications  Medication Sig Dispense Refill   CALCIUM PO Take by mouth.     Misc Natural Products (YUMVS BEET ROOT-TART CHERRY) 250-0.5 MG CHEW Chew by mouth.     VITAMIN D PO Take 6,000 Units by mouth daily. With K2 100 mcg     albuterol (VENTOLIN HFA) 108 (90 Base) MCG/ACT inhaler Inhale 2 puffs into the lungs every 6 (six) hours as needed for wheezing or shortness of breath. 2 each 3   Current Facility-Administered Medications   Medication Dose Route Frequency Provider Last Rate Last Admin   0.9 %  sodium chloride infusion  500 mL Intravenous Once Sarath Privott, Lajuan Lines, MD        Allergies as of 10/21/2021 - Review Complete 10/21/2021  Allergen Reaction Noted   No known allergies  07/26/2016    Family History  Problem Relation Age of Onset   Other Mother 53       murdered   Diabetes Father    Lung cancer Sister 70   Diabetes Mellitus I Sister    Diabetes Sister    Other Brother 15       murdered   Heart disease Maternal Grandmother    Diabetes Maternal Grandmother    Heart disease Maternal Grandfather    Kidney disease Maternal Grandfather    Diabetes Paternal Grandmother    Brain cancer Paternal Grandfather    Colon cancer Neg Hx    Colon polyps Neg Hx    Esophageal cancer Neg Hx    Gallbladder disease Neg Hx    Breast cancer Neg Hx    Rectal cancer Neg Hx    Stomach cancer Neg Hx     Social History   Socioeconomic History   Marital status: Single    Spouse name: Not on file   Number of children: 4   Years of education: Not on file   Highest education level: Not on file  Occupational History   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  Tobacco Use   Smoking status: Never   Smokeless tobacco: Never  Vaping Use   Vaping Use: Never used  Substance and Sexual Activity   Alcohol use: Not Currently    Comment: Rarely   Drug use: No   Sexual activity: Not on file  Other Topics Concern   Not on file  Social History Narrative   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, Insurance underwriter      Home Situation: lives alone      Spiritual Beliefs: Jahovah witness      Lifestyle: no regular exercise; trying to eat healthy      Social Determinants of Health   Financial Resource Strain: Low Risk  (10/16/2021)   Overall Financial Resource Strain (CARDIA)    Difficulty of Paying Living Expenses: Not hard at all  Food Insecurity: No Food Insecurity (10/16/2021)   Hunger Vital Sign    Worried About Running Out of  Food in the Last Year: Never true    [place] in the Last Year: Never true  Transportation Needs: No Transportation Needs (10/16/2021)   PRAPARE - Hydrologist (Medical): No    Lack of Transportation (Non-Medical): No  Physical Activity: Sufficiently Active (10/16/2021)   Exercise Vital Sign    Days of Exercise per Week: 5 days    Minutes of Exercise per Session: 30 min  Stress: No Stress Concern Present (10/16/2021)   [place]  Feeling of Stress : Not at all  Social Connections: Socially Isolated (10/16/2021)   Social Connection and Isolation Panel [NHANES]    Frequency of Communication with Friends and Family: More than three times a week    Frequency of Social Gatherings with Friends and Family: More than three times a week    Attends Religious Services: Never    Marine scientist or Organizations: No    Attends Archivist Meetings: Never    Marital Status: Separated  Intimate Partner Violence: Not At Risk (10/16/2021)   Humiliation, Afraid, Rape, and Kick questionnaire    Fear of Current or Ex-Partner: No    Emotionally Abused: No    Physically Abused: No    Sexually Abused: No    Physical Exam: Vital signs in last 24 hours: '@BP'$  (!) 150/71   Pulse (!) 56   Temp 98 F (36.7 C)   Ht '5\' 1"'$  (1.549 m)   Wt 150 lb (68 kg)   SpO2 96%   BMI 28.34 kg/m  GEN: NAD EYE: Sclerae anicteric ENT: MMM CV: Non-tachycardic Pulm: CTA b/l GI: Soft, NT/ND NEURO:  Alert & Oriented x 3   Zenovia Jarred, MD [facility]  10/21/2021 10:45 AM

## 2021-10-21 NOTE — Progress Notes (Signed)
Pt's states no medical or surgical changes since previsit or office visit. 

## 2021-10-21 NOTE — Patient Instructions (Signed)
YOU HAD AN ENDOSCOPIC PROCEDURE TODAY AT THE [facility]:   Refer to the procedure report that was given to you for any specific questions about what was found during the examination.  If the procedure report does not answer your questions, please call your gastroenterologist to clarify.  If you requested that your care partner not be given the details of your procedure findings, then the procedure report has been included in a sealed envelope for you to review at your convenience later.  YOU SHOULD EXPECT: Some feelings of bloating in the abdomen. Passage of more gas than usual.  Walking can help get rid of the air that was put into your GI tract during the procedure and reduce the bloating. If you had a lower endoscopy (such as a colonoscopy or flexible sigmoidoscopy) you may notice spotting of blood in your stool or on the toilet paper. If you underwent a bowel prep for your procedure, you may not have a normal bowel movement for a few days.  Please Note:  You might notice some irritation and congestion in your nose or some drainage.  This is from the oxygen used during your procedure.  There is no need for concern and it should clear up in a day or so.  SYMPTOMS TO REPORT IMMEDIATELY:  Following lower endoscopy (colonoscopy or flexible sigmoidoscopy):  Excessive amounts of blood in the stool  Significant tenderness or worsening of abdominal pains  Swelling of the abdomen that is new, acute  Fever of 100F or higher  For urgent or emergent issues, a gastroenterologist can be reached at any hour by calling [phone]. Do not use MyChart messaging for urgent concerns.    DIET:  We do recommend a small meal at first, but then you may proceed to your regular diet.  Drink plenty of fluids but you should avoid alcoholic beverages for 24 hours.  ACTIVITY:  You should plan to take it easy for the rest of today and you should NOT DRIVE or use heavy machinery until tomorrow (because of  the sedation medicines used during the test).    FOLLOW UP: Our staff will call the number listed on your records the next business day following your procedure.  We will call around 7:15- 8:00 am to check on you and address any questions or concerns that you may have regarding the information given to you following your procedure. If we do not reach you, we will leave a message.  If you develop any symptoms (ie: fever, flu-like symptoms, shortness of breath, cough etc.) before then, please call [phone].  If you test positive for Covid 19 in the 2 weeks post procedure, please call and report this information to us.    If any biopsies were taken you will be contacted by phone or by letter within the next 1-3 weeks.  Please call us at [phone] if you have not heard about the biopsies in 3 weeks.    SIGNATURES/CONFIDENTIALITY: You and/or your care partner have signed paperwork which will be entered into your electronic medical record.  These signatures attest to the fact that that the information above on your After Visit Summary has been reviewed and is understood.  Full responsibility of the confidentiality of this discharge information lies with you and/or your care-partner.  

## 2021-10-22 ENCOUNTER — Telehealth: Payer: Self-pay

## 2021-10-22 NOTE — Telephone Encounter (Signed)
  Follow up Call-     10/21/2021   10:26 AM  Call back number  Post procedure Call Back phone  # [phone]  Permission to leave phone message Yes     Patient questions:  Do you have a fever, pain , or abdominal swelling? No. Pain Score  0 *  Have you tolerated food without any problems? Yes.    Have you been able to return to your normal activities? Yes.    Do you have any questions about your discharge instructions: Diet   No. Medications  No. Follow up visit  No.  Do you have questions or concerns about your Care? No.  Actions: * If pain score is 4 or above: No action needed, pain <4.

## 2021-12-01 ENCOUNTER — Ambulatory Visit
Admission: RE | Admit: 2021-12-01 | Discharge: 2021-12-01 | Disposition: A | Discharge status: Home / Self Care | Payer: No Typology Code available for payment source | Source: Ambulatory Visit | Attending: Family | Admitting: Family

## 2021-12-01 DIAGNOSIS — Z1231 Encounter for screening mammogram for malignant neoplasm of breast: Secondary | ICD-10-CM

## 2022-01-28 DIAGNOSIS — I700 Atherosclerosis of aorta: Secondary | ICD-10-CM | POA: Diagnosis not present

## 2022-01-28 DIAGNOSIS — J45909 Unspecified asthma, uncomplicated: Secondary | ICD-10-CM | POA: Diagnosis not present

## 2022-01-28 DIAGNOSIS — R32 Unspecified urinary incontinence: Secondary | ICD-10-CM | POA: Diagnosis not present

## 2022-01-28 DIAGNOSIS — E663 Overweight: Secondary | ICD-10-CM | POA: Diagnosis not present

## 2022-01-28 DIAGNOSIS — Z008 Encounter for other general examination: Secondary | ICD-10-CM | POA: Diagnosis not present

## 2022-01-28 DIAGNOSIS — M810 Age-related osteoporosis without current pathological fracture: Secondary | ICD-10-CM | POA: Diagnosis not present

## 2022-01-28 DIAGNOSIS — Z6828 Body mass index (BMI) 28.0-28.9, adult: Secondary | ICD-10-CM | POA: Diagnosis not present

## 2022-04-20 ENCOUNTER — Telehealth: Payer: Self-pay | Admitting: Family Medicine

## 2022-04-20 DIAGNOSIS — J4520 Mild intermittent asthma, uncomplicated: Secondary | ICD-10-CM

## 2022-04-20 MED ORDER — ALBUTEROL SULFATE HFA 108 (90 BASE) MCG/ACT IN AERS: 2.0000 | INHALATION_SPRAY | Freq: Four times a day (QID) | RESPIRATORY_TRACT | 0 refills | Status: DC | PRN

## 2022-04-20 NOTE — Telephone Encounter (Signed)
Rx done. 

## 2022-04-20 NOTE — Telephone Encounter (Signed)
Ok to fill 

## 2022-04-20 NOTE — Telephone Encounter (Signed)
Pt called needing a refill on her albuterol (VENTOLIN HFA) 108 (90 Base) MCG/ACT inhaler pt has a TOC appt with Dr. Legrand Como for this Thursday and was hoping to get this filled now before her appt because she desperately needs it.   Please advise.

## 2022-04-22 ENCOUNTER — Ambulatory Visit (INDEPENDENT_AMBULATORY_CARE_PROVIDER_SITE_OTHER): Payer: No Typology Code available for payment source | Admitting: Family Medicine

## 2022-04-22 ENCOUNTER — Encounter: Payer: Self-pay | Admitting: Family Medicine

## 2022-04-22 VITALS — BP 122/60 | HR 37 | Temp 98.2°F | Ht 61.0 in | Wt 160.7 lb

## 2022-04-22 DIAGNOSIS — R001 Bradycardia, unspecified: Secondary | ICD-10-CM

## 2022-04-22 DIAGNOSIS — J209 Acute bronchitis, unspecified: Secondary | ICD-10-CM

## 2022-04-22 DIAGNOSIS — J4520 Mild intermittent asthma, uncomplicated: Secondary | ICD-10-CM | POA: Diagnosis not present

## 2022-04-22 MED ORDER — METHYLPREDNISOLONE 4 MG PO TBPK: ORAL_TABLET | ORAL | 0 refills | Status: DC

## 2022-04-22 MED ORDER — ALBUTEROL SULFATE (2.5 MG/3ML) 0.083% IN NEBU: 2.5000 mg | INHALATION_SOLUTION | Freq: Four times a day (QID) | RESPIRATORY_TRACT | 0 refills | Status: AC | PRN

## 2022-04-22 MED ORDER — BENZONATATE 100 MG PO CAPS: 100.0000 mg | ORAL_CAPSULE | Freq: Two times a day (BID) | ORAL | 0 refills | Status: DC | PRN

## 2022-04-22 MED ORDER — ALBUTEROL SULFATE HFA 108 (90 BASE) MCG/ACT IN AERS: 2.0000 | INHALATION_SPRAY | Freq: Four times a day (QID) | RESPIRATORY_TRACT | 5 refills | Status: DC | PRN

## 2022-04-22 MED ORDER — METHYLPREDNISOLONE ACETATE 80 MG/ML IJ SUSP
80.0000 mg | Freq: Once | INTRAMUSCULAR | Status: AC
  Administered 2022-04-22: 80 mg via INTRAMUSCULAR

## 2022-04-22 NOTE — Progress Notes (Signed)
Established Patient Office Visit  Subjective   Patient ID: Angel Irwin, female    DOB: Nov 16, 1950  Age: 72 y.o. MRN: [id number]  Chief Complaint  Patient presents with   Irregular Heart Beat    Patient states she has noticed her pulse rate ranging in the 50's for the past 2 months, did not seek treatment prior today    Patient is reporting new cough on Sunday-- progressively worsened over the last few days, states that she is coughing up some mucus no fever/chills, not sure if she has had any sick contacts, pt states she did a home COVID test and it was negative. States that she is out of her inhalers at home and needs refills. Right sided rib pain from the coughing.   Pt reports that she has a history of mild intermittent asthma and she uses a PRN albuterol inhaler. States she needs refills of this medication.  Low heart rate- pt reports that she has had low HR in the past, states that she usually runs in the 50's but today she is in the 30's on her vitals. Pt reports that she does not have any SOB, no dizziness, lightheadedness, or feelings of passing out. Pt states that she did see a cardiologist years ago in 2012 had ECHO and was told it was normal. I reviewed previous EKG from 2019 which showed bigeminy and PVC's.    Current Outpatient Medications  Medication Instructions   albuterol (PROVENTIL) 2.5 mg, Nebulization, Every 6 hours PRN   albuterol (VENTOLIN HFA) 108 (90 Base) MCG/ACT inhaler 2 puffs, Inhalation, Every 6 hours PRN   benzonatate (TESSALON) 100 mg, Oral, 2 times daily PRN   CALCIUM PO Oral   methylPREDNISolone (MEDROL DOSEPAK) 4 MG TBPK tablet Take package as directed.   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     Objective:     BP 122/60 (BP Location: Left Arm, Patient Position: Sitting, Cuff Size: Normal)   Pulse (!) 37   Temp 98.2 F (36.8 C) (Oral)   Ht '5\' 1"'$  (1.549 m)   Wt 160 lb 11.2 oz (72.9 kg)   SpO2 96%   BMI 30.36 kg/m    Physical Exam Vitals reviewed.  Constitutional:      Appearance: Normal appearance. She is well-groomed and normal weight.  HENT:     Right Ear: Tympanic membrane and ear canal normal.     Left Ear: Tympanic membrane and ear canal normal.     Nose: Congestion present.     Mouth/Throat:     Mouth: Mucous membranes are moist.  Eyes:     Conjunctiva/sclera: Conjunctivae normal.  Neck:     Thyroid: No thyromegaly.  Cardiovascular:     Rate and Rhythm: Regular rhythm. Bradycardia present.     Pulses: Normal pulses.     Heart sounds: S1 normal and S2 normal.  Pulmonary:     Effort: Pulmonary effort is normal.     Breath sounds: Normal air entry. Wheezing (severe inspiratory and expiratory wheezing throughout all  lung fields) present.  Abdominal:     General: Bowel sounds are normal.  Musculoskeletal:     Right lower leg: No edema.     Left lower leg: No edema.  Neurological:     Mental Status: She is alert and oriented to person, place, and time. Mental status is at baseline.     Gait: Gait is intact.  Psychiatric:        Mood and Affect: Mood and affect normal.        Speech: Speech normal.        Behavior: Behavior normal.        Judgment: Judgment normal.      No results found for any visits on 04/22/22.    The 10-year ASCVD risk score (Arnett DK, et al., 2019) is: 10.1%    Assessment & Plan:   Problem List Items Addressed This Visit       Unprioritized   Asthma, chronic   Relevant Medications   methylPREDNISolone acetate (DEPO-MEDROL) injection 80 mg (Start on 04/22/2022   1:30 PM)   methylPREDNISolone (MEDROL DOSEPAK) 4 MG TBPK tablet   albuterol (VENTOLIN HFA) 108 (90 Base) MCG/ACT inhaler   albuterol (PROVENTIL) (2.5 MG/3ML) 0.083% nebulizer solution   Other Visit Diagnoses     Acute bronchitis, unspecified organism    -  Primary   Relevant Medications   Patient is very tight on exam, severe wheezing throughout the lung fields. I recommend giving an injection of methylprednisolone 80 mg IM in the office today and tomorrow she will start methylprednisolone dose pak to help reduce inflammation in the airways. I have reordered her albuterol inhaler and I will also give her albuterol nebulizer treatment while she is acutely sick. 2.5 mg NEB every 6 hours as needed for SOB and cough. She may use Benzonatate 100 mg BID as needed for the cough as well.  methylPREDNISolone acetate (DEPO-MEDROL) injection 80 mg (Start on 04/22/2022  1:30 PM)   methylPREDNISolone (MEDROL DOSEPAK) 4 MG TBPK tablet   albuterol (PROVENTIL) (2.5 MG/3ML) 0.083% nebulizer solution   benzonatate (TESSALON) 100 MG capsule   Bradycardia       Relevant Orders   Pt is severely coughing in office today so I am unable to perform EKG, however the patient denies any symptoms related to the bradycardia. I will refer the patient to cardiology for evaluation of the bradycardia.  Ambulatory referral to Cardiology       Return in about 6 months (around 10/21/2022) for Annual Physical Exam.    Farrel Conners, MD

## 2022-05-13 ENCOUNTER — Ambulatory Visit
Payer: No Typology Code available for payment source | Attending: Cardiovascular Disease | Admitting: Cardiovascular Disease

## 2022-05-13 ENCOUNTER — Encounter: Payer: Self-pay | Admitting: Cardiovascular Disease

## 2022-05-13 ENCOUNTER — Ambulatory Visit (INDEPENDENT_AMBULATORY_CARE_PROVIDER_SITE_OTHER): Payer: No Typology Code available for payment source

## 2022-05-13 VITALS — BP 118/70 | HR 78 | Ht 61.0 in | Wt 159.6 lb

## 2022-05-13 DIAGNOSIS — I493 Ventricular premature depolarization: Secondary | ICD-10-CM | POA: Diagnosis not present

## 2022-05-13 DIAGNOSIS — R002 Palpitations: Secondary | ICD-10-CM | POA: Diagnosis not present

## 2022-05-13 NOTE — Progress Notes (Unsigned)
Enrolled patient for a 7 day Zio XT monitor to be mailed to patients home.  

## 2022-05-13 NOTE — Patient Instructions (Signed)
Medication Instructions:  Your physician recommends that you continue on your current medications as directed. Please refer to the Current Medication list given to you today.  *If you need a refill on your cardiac medications before your next appointment, please call your pharmacy*  Lab Work: Today: BMP, TSH, magnesium If you have labs (blood work) drawn today and your tests are completely normal, you will receive your results only by: [place] (if you have MyChart) OR A paper copy in the mail If you have any lab test that is abnormal or we need to change your treatment, we will call you to review the results.  Testing/Procedures: Your physician has requested that you wear a Zio heart monitor for 3 days. This will be mailed to your home with instructions on how to apply the monitor and how to return it when finished. Please allow 2 weeks after returning the heart monitor before our office calls you with the results.   Your physician has requested that you have an echocardiogram. Echocardiography is a painless test that uses sound waves to create images of your heart. It provides your doctor with information about the size and shape of your heart and how well your heart's chambers and valves are working. This procedure takes approximately one hour. There are no restrictions for this procedure. Please do NOT wear cologne, perfume, aftershave, or lotions (deodorant is allowed). Please arrive 15 minutes prior to your appointment time.  Follow-Up: At [facility], you and your health needs are our priority.  As part of our continuing mission to provide you with exceptional heart care, we have created designated Provider Care Teams.  These Care Teams include your primary Cardiologist (physician) and Advanced Practice Providers (APPs -  Physician Assistants and Nurse Practitioners) who all work together to provide you with the care you need, when you need it.  Your next appointment:    2 month(s)  Provider:   Mertie Moores, MD   Other Instructions [facility]- Long Term Monitor Instructions     Your physician has requested you wear a ZIO patch monitor for 3 days.  This is a single patch monitor. [employer] supplies one patch monitor per enrollment. Additional  stickers are not available. Please do not apply patch if you will be having a Nuclear Stress Test,  Echocardiogram, Cardiac CT, MRI, or Chest Xray during the period you would be wearing the  monitor. The patch cannot be worn during these tests. You cannot remove and re-apply the  ZIO XT patch monitor.  Your ZIO patch monitor will be mailed 3 day USPS to your address on file. It may take 3-5 days  to receive your monitor after you have been enrolled.  Once you have received your monitor, please review the enclosed instructions. Your monitor  has already been registered assigning a specific monitor serial # to you.     Billing and Patient Assistance Program Information     We have supplied [employer] with any of your insurance information on file for billing purposes.  [employer] offers a sliding scale Patient Assistance Program for patients that do not have  insurance, or whose insurance does not completely cover the cost of the ZIO monitor.  You must apply for the Patient Assistance Program to qualify for this discounted rate.  To apply, please call [employer] at [phone], select option 4, select option 2, ask to apply for  Patient Assistance Program. Theodore Demark will ask your household income, and how many people  are  in your household. They will quote your out-of-pocket cost based on that information.  [employer] will also be able to set up a 39-month interest-free payment plan if needed.     Applying the monitor     Shave hair from upper left chest.  Hold abrader disc by orange tab. Rub abrader in 40 strokes over the upper left chest as  indicated in your monitor instructions.  Clean area with 4 enclosed alcohol pads.  Let dry.  Apply patch as indicated in monitor instructions. Patch will be placed under collarbone on left  side of chest with arrow pointing upward.  Rub patch adhesive wings for 2 minutes. Remove white label marked "1". Remove the white  label marked "2". Rub patch adhesive wings for 2 additional minutes.  While looking in a mirror, press and release button in center of patch. A small green light will  flash 3-4 times. This will be your only indicator that the monitor has been turned on.  Do not shower for the first 24 hours. You may shower after the first 24 hours.  Press the button if you feel a symptom. You will hear a small click. Record Date, Time and  Symptom in the Patient Logbook.  When you are ready to remove the patch, follow instructions on the last 2 pages of Patient  Logbook. Stick patch monitor onto the last page of Patient Logbook.  Place Patient Logbook in the blue and white box. Use locking tab on box and tape box closed  securely. The blue and white box has prepaid postage on it. Please place it in the mailbox as  soon as possible. Your physician should have your test results approximately 7 days after the  [facility]  Call [facility]at [phone] you have questions regarding  your ZIO XT patch monitor. Call them immediately if you see an orange light blinking on your  monitor.  If your monitor falls off in less than 4 days, contact our Monitor department at [phone]  If your monitor becomes loose or falls off after 4 days call [employer] at [phone]  suggestions on securing your monitor.

## 2022-05-13 NOTE — Progress Notes (Signed)
Cardiology Office Note:    Date:  05/13/2022   ID:  Angel Irwin, DOB 10/04/1950, MRN [id number]  PCP:  Farrel Conners, [place] Providers Cardiologist:  Mercedies Ganesh    Referring MD: Farrel Conners, MD   Chief Complaint  Patient presents with   Bradycardia    History of Present Illness:    Angel Irwin is a 72 y.o. female with a hx of  bradycardia . We are asked to see her for further evaluation of her bradycardia  The patient was into see her primary medical doctor for further evaluation of symptoms of upper respiratory tract infection.  She was noted to have a heart rate of 37 at that time.  Her doctor commented that her previous EKG showed ventricular bigeminy.  Wears her fit bit  -she occasionally gets a heart rate reading of 34 on her Fitbit.  She denies any symptoms of syncope or presyncope. 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  No CP , no dyspnea  No dyspnea with shopping or yard work . 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   Iron deficiency anemia due to chronic blood loss 07/23/2014   Vitamin D deficiency 08/26/2016    Past Surgical History:  Procedure Laterality Date   ANTERIOR AND POSTERIOR REPAIR  02/22/2011   Procedure: ANTERIOR (CYSTOCELE) AND  POSTERIOR REPAIR (RECTOCELE);  Surgeon: Delice Lesch, MD;  Location: [facility];  Service: Gynecology;  Laterality: N/A;   COLONOSCOPY  06/13/2014   CYSTOSCOPY  02/22/2011   Procedure: CYSTOSCOPY;  Surgeon: Delice Lesch, MD;  Location: [facility];  Service: Gynecology;  Laterality: N/A;   DILATION AND CURETTAGE OF UTERUS  04/19/1978   EXTERNAL FIXATION LEG Left 07/24/2016   Procedure: EXTERNAL FIXATION LEFT LOWER LEG;  Surgeon: Rod Can, MD;  Location: [facility];  Service: Orthopedics;  Laterality: Left;   HARDWARE REMOVAL Left 10/01/2019   Procedure: HARDWARE REMOVAL TIBIA;  Surgeon: Altamese Summerfield, MD;  Location: [facility];  Service: Orthopedics;  Laterality: Left;   ORIF TIBIA PLATEAU Left 07/27/2016   Procedure: OPEN REDUCTION INTERNAL FIXATION (ORIF) TIBIAL PLATEAU;  Surgeon: Altamese Orland, MD;  Location: [facility];  Service: Orthopedics;  Laterality: Left;   SALPINGOOPHORECTOMY  02/22/2011   Procedure: SALPINGO OOPHERECTOMY;  Surgeon: Delice Lesch, MD;  Location: [facility];  Service: Gynecology;  Laterality: Bilateral;   UPPER GASTROINTESTINAL ENDOSCOPY  06/13/2014   VAGINAL HYSTERECTOMY  02/22/2011   Procedure: HYSTERECTOMY VAGINAL;  Surgeon: Delice Lesch, MD;  Location: [facility];  Service: Gynecology;  Laterality: N/A;   WISDOM TOOTH EXTRACTION      Current Medications: Current Meds  Medication Sig  albuterol (PROVENTIL) (2.5 MG/3ML) 0.083% nebulizer solution Take 3 mLs (2.5 mg total) by nebulization every 6 (six) hours as needed for wheezing or shortness of breath.   albuterol (VENTOLIN HFA) 108 (90 Base) MCG/ACT inhaler Inhale 2 puffs into the lungs every 6 (six) hours as needed for wheezing or shortness of breath.   benzonatate (TESSALON) 100 MG capsule Take 1 capsule (100 mg total) by mouth 2 (two) times daily as needed for cough.   CALCIUM PO Take by mouth.   Magnesium 250 MG TABS Take 250 mg by mouth daily in the afternoon.   VITAMIN D PO Take 6,000 Units by mouth daily. With  K2 100 mcg     Allergies:   No known allergies   Social History   Socioeconomic History   Marital status: Single    Spouse name: Not on file   Number of children: 4   Years of education: Not on file   Highest education level: Not on file  Occupational History   Occupation: Teacher, English as a foreign language  Tobacco Use   Smoking status: Never   Smokeless tobacco: Never  Vaping Use   Vaping Use: Never used  Substance and Sexual Activity   Alcohol use: Not Currently    Comment: Rarely   Drug use: No   Sexual activity: Not on file  Other Topics Concern   Not on file  Social History Narrative   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, Insurance underwriter      Home Situation: lives alone      Spiritual Beliefs: Jahovah witness      Lifestyle: no regular exercise; trying to eat healthy      Social Determinants of Health   Financial Resource Strain: Low Risk  (10/16/2021)   Overall Financial Resource Strain (CARDIA)    Difficulty of Paying Living Expenses: Not hard at all  Food Insecurity: No Food Insecurity (10/16/2021)   Hunger Vital Sign    Worried About Running Out of Food in the Last Year: Never true    [place] in the Last Year: Never true  Transportation Needs: No Transportation Needs (10/16/2021)   PRAPARE - Hydrologist (Medical): No    Lack of Transportation (Non-Medical): No  Physical Activity: Sufficiently Active (10/16/2021)   Exercise Vital Sign    Days of Exercise per Week: 5 days    Minutes of Exercise per Session: 30 min  Stress: No Stress Concern Present (10/16/2021)   [place]    Feeling of Stress : Not at all  Social Connections: Socially Isolated (10/16/2021)   Social Connection and Isolation Panel [NHANES]    Frequency of Communication with Friends and Family: More than three times a week    Frequency of Social Gatherings with Friends and Family: More than three times  a week    Attends Religious Services: Never    Marine scientist or Organizations: No    Attends Music therapist: Never    Marital Status: Separated     Family History: The patient's family history includes Brain cancer in her paternal grandfather; Diabetes in her father, maternal grandmother, paternal grandmother, and sister; Diabetes Mellitus I in her sister; Heart disease in her maternal grandfather and maternal grandmother; Kidney disease in her maternal grandfather; Lung cancer (age of onset: 30) in her sister; Other (age of onset: 87) in her brother; Other (age of onset: 21) in her mother. There is  no history of Colon cancer, Colon polyps, Esophageal cancer, Gallbladder disease, Breast cancer, Rectal cancer, or Stomach cancer.  ROS:   Please see the history of present illness.     All other systems reviewed and are negative.  EKGs/Labs/Other Studies Reviewed:    The following studies were reviewed today:   EKG: May 13, 2022:: Normal sinus rhythm at 78.  She has frequent premature ventricular contractions in a bigeminal pattern.  Otherwise EKG is normal.  1 minute step test: At the patient's step up and down on the examination table bench for 1 to 2 minutes.  Her heart rate started at 37 and then slowly increased for up to the 40s and low 50s.  Then it suddenly increased to 110-115. On examination her PVCs have resolved.  As she was in the recovery phase she started having recurrent PVCs again.    Recent Labs: 10/19/2021: ALT 16; BUN 12; Creatinine, Ser 0.66; Hemoglobin 13.3; Platelets 236.0; Potassium 3.9; Sodium 136; TSH 3.37  Recent Lipid Panel    Component Value Date/Time   CHOL 188 10/19/2021 1237   TRIG 145.0 10/19/2021 1237   HDL 37.40 (L) 10/19/2021 1237   CHOLHDL 5 10/19/2021 1237   VLDL 29.0 10/19/2021 1237   LDLCALC 122 (H) 10/19/2021 1237   LDLCALC 137 (H) 06/06/2020 1629   LDLDIRECT 146.7 04/27/2013 0825     Risk  Assessment/Calculations:                Physical Exam:    VS:  BP 118/70   Pulse 78   Ht '5\' 1"'$  (1.549 m)   Wt 159 lb 9.6 oz (72.4 kg)   SpO2 97%   BMI 30.16 kg/m     Wt Readings from Last 3 Encounters:  05/13/22 159 lb 9.6 oz (72.4 kg)  04/22/22 160 lb 11.2 oz (72.9 kg)  10/21/21 150 lb (68 kg)     GEN:  Well nourished, well developed in no acute distress HEENT: Normal NECK: No JVD; No carotid bruits LYMPHATICS: No lymphadenopathy CARDIAC: Rhythm is irregularly irregular in a bigeminal pattern.  There is no significant murmur. RESPIRATORY:  Clear to auscultation without rales, wheezing or rhonchi  ABDOMEN: Soft, non-tender, non-distended MUSCULOSKELETAL:  No edema; No deformity  SKIN: Warm and dry NEUROLOGIC:  Alert and oriented x 3 PSYCHIATRIC:  Normal affect   ASSESSMENT:    1. PVC's (premature ventricular contractions)    PLAN:    In order of problems listed above:  Frequent premature ventricular contractions: The patient is in ventricular bigeminy.  She has frequent premature ventricular contractions.  I like to get an echocardiogram to assess her LV function.  Will also get a 3-day event monitor to assess her PVC burden.  If she has a significant PVC burden and her LV function is low then we will need to put her on a medication (?  Flecainide) to suppress the PVCs.  I will review this with our electrophysiology team .  I will see her back in the office in 2 months.           Medication Adjustments/Labs and Tests Ordered: Current medicines are reviewed at length with the patient today.  Concerns regarding medicines are outlined above.  Orders Placed This Encounter  Procedures   Basic metabolic panel   TSH   Magnesium   LONG TERM MONITOR (3-14 DAYS)   EKG 12-Lead   ECHOCARDIOGRAM COMPLETE   No orders of the defined types were placed in this encounter.  Patient Instructions  Medication Instructions:  Your physician recommends that you continue on  your current medications as directed. Please refer to the Current Medication list given to you today.  *If you need a refill on your cardiac medications before your next appointment, please call your pharmacy*  Lab Work: Today: BMP, TSH, magnesium If you have labs (blood work) drawn today and your tests are completely normal, you will receive your results only by: [place] (if you have MyChart) OR A paper copy in the mail If you have any lab test that is abnormal or we need to change your treatment, we will call you to review the results.  Testing/Procedures: Your physician has requested that you wear a Zio heart monitor for 3 days. This will be mailed to your home with instructions on how to apply the monitor and how to return it when finished. Please allow 2 weeks after returning the heart monitor before our office calls you with the results.   Your physician has requested that you have an echocardiogram. Echocardiography is a painless test that uses sound waves to create images of your heart. It provides your doctor with information about the size and shape of your heart and how well your heart's chambers and valves are working. This procedure takes approximately one hour. There are no restrictions for this procedure. Please do NOT wear cologne, perfume, aftershave, or lotions (deodorant is allowed). Please arrive 15 minutes prior to your appointment time.  Follow-Up: At [facility], you and your health needs are our priority.  As part of our continuing mission to provide you with exceptional heart care, we have created designated Provider Care Teams.  These Care Teams include your primary Cardiologist (physician) and Advanced Practice Providers (APPs -  Physician Assistants and Nurse Practitioners) who all work together to provide you with the care you need, when you need it.  Your next appointment:   2 month(s)  Provider:   Mertie Moores, MD   Other Instructions [facility]- Long Term Monitor Instructions     Your physician has requested you wear a ZIO patch monitor for 3 days.  This is a single patch monitor. [employer] supplies one patch monitor per enrollment. Additional  stickers are not available. Please do not apply patch if you will be having a Nuclear Stress Test,  Echocardiogram, Cardiac CT, MRI, or Chest Xray during the period you would be wearing the  monitor. The patch cannot be worn during these tests. You cannot remove and re-apply the  ZIO XT patch monitor.  Your ZIO patch monitor will be mailed 3 day USPS to your address on file. It may take 3-5 days  to receive your monitor after you have been enrolled.  Once you have received your monitor, please review the enclosed instructions. Your monitor  has already been registered assigning a specific monitor serial # to you.     Billing and Patient Assistance Program Information     We have supplied [employer] with any of your insurance information on file for billing purposes.  [employer] offers a sliding scale Patient Assistance Program for patients that do not have  insurance, or whose insurance does not completely cover the cost of the ZIO monitor.  You must apply for the Patient Assistance Program to qualify for this discounted rate.  To apply, please call [employer] at [phone], select option 4, select option 2, ask to apply for  Patient Assistance Program. Theodore Demark will ask your household income, and how  many people  are in your household. They will quote your out-of-pocket cost based on that information.  [employer] will also be able to set up a 29-month interest-free payment plan if needed.     Applying the monitor     Shave hair from upper left chest.  Hold abrader disc by orange tab. Rub abrader in 40 strokes over the upper left chest as  indicated in your monitor instructions.  Clean area with 4 enclosed alcohol pads. Let dry.  Apply patch as indicated in monitor instructions. Patch will be  placed under collarbone on left  side of chest with arrow pointing upward.  Rub patch adhesive wings for 2 minutes. Remove white label marked "1". Remove the white  label marked "2". Rub patch adhesive wings for 2 additional minutes.  While looking in a mirror, press and release button in center of patch. A small green light will  flash 3-4 times. This will be your only indicator that the monitor has been turned on.  Do not shower for the first 24 hours. You may shower after the first 24 hours.  Press the button if you feel a symptom. You will hear a small click. Record Date, Time and  Symptom in the Patient Logbook.  When you are ready to remove the patch, follow instructions on the last 2 pages of Patient  Logbook. Stick patch monitor onto the last page of Patient Logbook.  Place Patient Logbook in the blue and white box. Use locking tab on box and tape box closed  securely. The blue and white box has prepaid postage on it. Please place it in the mailbox as  soon as possible. Your physician should have your test results approximately 7 days after the  [facility]  Call [facility] [phone] you have questions regarding  your ZIO XT patch monitor. Call them immediately if you see an orange light blinking on your  monitor.  If your monitor falls off in less than 4 days, contact our Monitor department at [phone]  If your monitor becomes loose or falls off after 4 days call [employer] at [phone]  suggestions on securing your monitor.     Signed, PMertie Moores MD  05/13/2022 5:16 PM    [place]

## 2022-05-14 LAB — BASIC METABOLIC PANEL
BUN/Creatinine Ratio: 20 (ref 12–28)
BUN: 15 mg/dL (ref 8–27)
CO2: 24 mmol/L (ref 20–29)
Calcium: 10.3 mg/dL (ref 8.7–10.3)
Chloride: 103 mmol/L (ref 96–106)
Creatinine, Ser: 0.76 mg/dL (ref 0.57–1.00)
Glucose: 78 mg/dL (ref 70–99)
Potassium: 4.4 mmol/L (ref 3.5–5.2)
Sodium: 141 mmol/L (ref 134–144)
eGFR: 84 mL/min/{1.73_m2} (ref >59)

## 2022-05-14 LAB — MAGNESIUM: Magnesium: 2.2 mg/dL (ref 1.6–2.3)

## 2022-05-14 LAB — TSH: TSH: 3.37 u[IU]/mL (ref 0.450–4.500)

## 2022-05-17 DIAGNOSIS — I493 Ventricular premature depolarization: Secondary | ICD-10-CM | POA: Diagnosis not present

## 2022-05-26 DIAGNOSIS — I493 Ventricular premature depolarization: Secondary | ICD-10-CM | POA: Diagnosis not present

## 2022-06-07 ENCOUNTER — Ambulatory Visit (HOSPITAL_COMMUNITY): Payer: No Typology Code available for payment source | Attending: Internal Medicine

## 2022-06-07 DIAGNOSIS — I340 Nonrheumatic mitral (valve) insufficiency: Secondary | ICD-10-CM | POA: Diagnosis not present

## 2022-06-07 DIAGNOSIS — I493 Ventricular premature depolarization: Secondary | ICD-10-CM

## 2022-06-07 LAB — ECHOCARDIOGRAM COMPLETE
Area-P 1/2: 3.21 cm2
S' Lateral: 3.2 cm

## 2022-07-05 ENCOUNTER — Encounter: Payer: Self-pay | Admitting: Cardiovascular Disease

## 2022-07-05 NOTE — Progress Notes (Signed)
Cardiology Office Note:    Date:  07/15/2022   ID:  Angel Irwin, DOB [birth year], MRN [id number]  PCP:  Farrel Conners, [place] Prov[facility] Cardiologist:  Jamaris Theard    Referring MD: Farrel Conners, MD   Chief Complaint  Patient presents with   Palpitations    History of Present Illness:    Angel Irwin is a 72 y.o. female with a hx of  bradycardia . We are asked to see her for further evaluation of her bradycardia  The patient was into see her primary medical doctor for further evaluation of symptoms of upper respiratory tract infection.  She was noted to have a heart rate of 37 at that time.  Her doctor commented that her previous EKG showed ventricular bigeminy.  Wears her fit bit  -she occasionally gets a heart rate reading of 34 on her Fitbit.  She denies any symptoms of syncope or presyncope. 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  No CP , no dyspnea  No dyspnea with shopping or yard work . 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  [employer]       Past Medical History:  Diagnosis Date   Adenomatous colon polyp 09/04/2014   Asthma    albuterol-rescue inhaler-uses 1-2 x year   Bone disease, metabolic 123456   [name] lesion, chronic    on sm bowel capsule 06/2014   Closed fracture of lateral portion of left tibial plateau [id number]   Complication of anesthesia    slow to awaken   Fundic gland polyps of stomach, benign    GERD (gastroesophageal reflux disease)    no meds   Hiatal hernia    History of kidney stones 09/2017   Iron deficiency anemia    due to camerons  erosion and small bowel angioectasia   Iron deficiency anemia due to chronic blood loss 07/23/2014   Vitamin D deficiency 08/26/2016    Past Surgical History:  Procedure Laterality Date   ANTERIOR AND POSTERIOR REPAIR  02/22/2011   Procedure: ANTERIOR (CYSTOCELE) AND POSTERIOR REPAIR (RECTOCELE);  Surgeon: Delice Lesch, MD;  Location: [facility];  Service: Gynecology;  Laterality: N/A;   COLONOSCOPY  06/13/2014   CYSTOSCOPY  02/22/2011   Procedure: CYSTOSCOPY;  Surgeon: Delice Lesch, MD;  Location: [facility];  Service: Gynecology;  Laterality: N/A;   DILATION AND CURETTAGE OF UTERUS  04/19/1978   EXTERNAL FIXATION LEG Left 07/24/2016   Procedure: EXTERNAL FIXATION LEFT LOWER LEG;  Surgeon: Rod Can, MD;  Location: [facility];  Service: Orthopedics;  Laterality: Left;   HARDWARE REMOVAL Left 10/01/2019   Procedure: HARDWARE REMOVAL TIBIA;  Surgeon: Altamese Pomeroy, MD;  Location: [facility];  Service: Orthopedics;  Laterality: Left;   ORIF TIBIA PLATEAU Left 07/27/2016   Procedure: OPEN REDUCTION INTERNAL FIXATION (ORIF) TIBIAL PLATEAU;  Surgeon: Altamese Nunda, MD;  Location: [facility];  Service: Orthopedics;  Laterality: Left;   SALPINGOOPHORECTOMY  02/22/2011   Procedure: SALPINGO OOPHERECTOMY;  Surgeon: Delice Lesch, MD;  Location: [facility];  Service: Gynecology;  Laterality: Bilateral;   UPPER GASTROINTESTINAL ENDOSCOPY  06/13/2014   VAGINAL HYSTERECTOMY  02/22/2011   Procedure: HYSTERECTOMY VAGINAL;  Surgeon: Delice Lesch, MD;  Location: [facility];  Service: Gynecology;  Laterality: N/A;   WISDOM TOOTH EXTRACTION      Current Medications: Current Meds  Medication Sig   albuterol (PROVENTIL) (2.5 MG/3ML) 0.083% nebulizer solution Take 3 mLs (2.5 mg total) by nebulization every 6 (six) hours as needed for wheezing or shortness of breath.   albuterol (VENTOLIN HFA) 108 (90 Base) MCG/ACT inhaler Inhale 2 puffs into the lungs every 6 (six) hours as needed for wheezing or shortness of breath.    benzonatate (TESSALON) 100 MG capsule Take 1 capsule (100 mg total) by mouth 2 (two) times daily as needed for cough.   CALCIUM PO Take by mouth.   Magnesium 250 MG TABS Take 250 mg by mouth daily in the afternoon.   VITAMIN D PO Take 6,000 Units by mouth daily. With K2 100 mcg     Allergies:   No known allergies   Social History   Socioeconomic History   Marital status: Single    Spouse name: Not on file   Number of children: 4   Years of education: Not on file   Highest education level: Not on file  Occupational History   Occupation: Teacher, English as a foreign language  Tobacco Use   Smoking status: Never   Smokeless tobacco: Never  Vaping Use   Vaping Use: Never used  Substance and Sexual Activity   Alcohol use: Not Currently    Comment: Rarely   Drug use: No   Sexual activity: Not on file  Other Topics Concern   Not on file  Social History Narrative   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, Insurance underwriter      Home Situation: lives alone      Spiritual Beliefs: Jahovah witness      Lifestyle: no regular exercise; trying to eat healthy      Social Determinants of Health   Financial Resource Strain: Low Risk  (10/16/2021)   Overall Financial Resource Strain (CARDIA)    Difficulty of Paying Living Expenses: Not hard at all  Food Insecurity: No Food Insecurity (10/16/2021)   Hunger Vital Sign    Worried About Running Out of Food in the Last Year: Never true    Painter in the Last Year: Never true  Transportation Needs: No Transportation Needs (10/16/2021)   PRAPARE - Hydrologist (Medical): No    Lack of Transportation (Non-Medical): No  Physical Activity: Sufficiently Active (10/16/2021)   Exercise Vital Sign    Days of Exercise per Week: 5 days    Minutes of Exercise per Session: 30 min  Stress: No Stress Concern Present (10/16/2021)   [place]    Feeling of Stress : Not at  all  Social Connections: Socially Isolated (10/16/2021)   Social Connection and Isolation Panel [NHANES]    Frequency of Communication with Friends and Family: More than three times a week    Frequency of Social Gatherings with Friends and Family: More than three times a week    Attends Religious Services: Never    Marine scientist or Organizations: No    Attends Archivist Meetings: Never    Marital Status: Separated     Family History: The patient's family history includes Brain cancer in her paternal grandfather; Diabetes in her  father, maternal grandmother, paternal grandmother, and sister; Diabetes Mellitus I in her sister; Heart disease in her maternal grandfather and maternal grandmother; Kidney disease in her maternal grandfather; Lung cancer (age of onset: 43) in her sister; Other (age of onset: 78) in her brother; Other (age of onset: 57) in her mother. There is no history of Colon cancer, Colon polyps, Esophageal cancer, Gallbladder disease, Breast cancer, Rectal cancer, or Stomach cancer.  ROS:   Please see the history of present illness.     All other systems reviewed and are negative.  EKGs/Labs/Other Studies Reviewed:    The following studies were reviewed today:   EKG:    Recent Labs: 10/19/2021: ALT 16; Hemoglobin 13.3; Platelets 236.0 05/13/2022: BUN 15; Creatinine, Ser 0.76; Magnesium 2.2; Potassium 4.4; Sodium 141; TSH 3.370  Recent Lipid Panel    Component Value Date/Time   CHOL 188 10/19/2021 1237   TRIG 145.0 10/19/2021 1237   HDL 37.40 (L) 10/19/2021 1237   CHOLHDL 5 10/19/2021 1237   VLDL 29.0 10/19/2021 1237   LDLCALC 122 (H) 10/19/2021 1237   LDLCALC 137 (H) 06/06/2020 1629   LDLDIRECT 146.7 04/27/2013 0825     Risk Assessment/Calculations:            Physical Exam:    Physical Exam: Blood pressure 126/72, pulse 68, height 5\' 1"  (1.549 m), weight 163 lb (73.9 kg), SpO2 98 %.   GEN:  Well nourished, well developed in no  acute distress HEENT: Normal NECK: No JVD; No carotid bruits LYMPHATICS: No lymphadenopathy CARDIAC: RRR , no murmurs, rubs, gallops RESPIRATORY:  Clear to auscultation without rales, wheezing or rhonchi  ABDOMEN: Soft, non-tender, non-distended MUSCULOSKELETAL:  No edema; No deformity  SKIN: Warm and dry NEUROLOGIC:  Alert and oriented x 3   ASSESSMENT:    1. PVC's (premature ventricular contractions)     PLAN:      Frequent premature ventricular contractions:  She is asymptomatic  EF remains well preserved  Wil consider repeat echo in a year  May ask an opinion from EP .   Will see her in 6 months            Medication Adjustments/Labs and Tests Ordered: Current medicines are reviewed at length with the patient today.  Concerns regarding medicines are outlined above.  No orders of the defined types were placed in this encounter.  No orders of the defined types were placed in this encounter.   Patient Instructions  Medication Instructions:  Your physician recommends that you continue on your current medications as directed. Please refer to the Current Medication list given to you today.  *If you need a refill on your cardiac medications before your next appointment, please call your pharmacy*   Lab Work: NONE If you have labs (blood work) drawn today and your tests are completely normal, you will receive your results only by: [place] (if you have MyChart) OR A paper copy in the mail If you have any lab test that is abnormal or we need to change your treatment, we will call you to review the results.   Testing/Procedures: NONE   Follow-Up: At [facility], you and your health needs are our priority.  As part of our continuing mission to provide you with exceptional heart care, we have created designated Provider Care Teams.  These Care Teams include your primary Cardiologist (physician) and Advanced Practice Providers (APPs -  Physician  Assistants and Nurse Practitioners) who all work together to provide you  with the care you need, when you need it.  We recommend signing up for the patient portal called "MyChart".  Sign up information is provided on this After Visit Summary.  MyChart is used to connect with patients for Virtual Visits (Telemedicine).  Patients are able to view lab/test results, encounter notes, upcoming appointments, etc.  Non-urgent messages can be sent to your provider as well.   To learn more about what you can do with MyChart, go to [email or web address].    Your next appointment:   6 month(s)  Provider:   Mertie Moores, MD        Signed, Mertie Moores, MD  07/15/2022 3:22 PM    [place]

## 2022-07-06 ENCOUNTER — Encounter: Payer: Self-pay | Admitting: Cardiovascular Disease

## 2022-07-06 ENCOUNTER — Ambulatory Visit
Payer: No Typology Code available for payment source | Attending: Cardiovascular Disease | Admitting: Cardiovascular Disease

## 2022-07-06 VITALS — BP 126/72 | HR 68 | Ht 61.0 in | Wt 163.0 lb

## 2022-07-06 DIAGNOSIS — I493 Ventricular premature depolarization: Secondary | ICD-10-CM | POA: Diagnosis not present

## 2022-07-06 NOTE — Patient Instructions (Signed)
Medication Instructions:  Your physician recommends that you continue on your current medications as directed. Please refer to the Current Medication list given to you today.  *If you need a refill on your cardiac medications before your next appointment, please call your pharmacy*   Lab Work: NONE If you have labs (blood work) drawn today and your tests are completely normal, you will receive your results only by: MyChart Message (if you have MyChart) OR A paper copy in the mail If you have any lab test that is abnormal or we need to change your treatment, we will call you to review the results.   Testing/Procedures: NONE   Follow-Up: At [facility], you and your health needs are our priority.  As part of our continuing mission to provide you with exceptional heart care, we have created designated Provider Care Teams.  These Care Teams include your primary Cardiologist (physician) and Advanced Practice Providers (APPs -  Physician Assistants and Nurse Practitioners) who all work together to provide you with the care you need, when you need it.  We recommend signing up for the patient portal called "MyChart".  Sign up information is provided on this After Visit Summary.  MyChart is used to connect with patients for Virtual Visits (Telemedicine).  Patients are able to view lab/test results, encounter notes, upcoming appointments, etc.  Non-urgent messages can be sent to your provider as well.   To learn more about what you can do with MyChart, go to [email or web address].    Your next appointment:   6 month(s)  Provider:   Philip Nahser, MD      

## 2022-10-19 ENCOUNTER — Ambulatory Visit: Payer: No Typology Code available for payment source | Admitting: Family Medicine

## 2022-10-19 DIAGNOSIS — Z0000 Encounter for general adult medical examination without abnormal findings: Secondary | ICD-10-CM

## 2022-10-19 NOTE — Patient Instructions (Signed)
I really enjoyed getting to talk with you today! I am available on Tuesdays and Thursdays for virtual visits if you have any questions or concerns, or if I can be of any further assistance.   CHECKLIST FROM ANNUAL WELLNESS VISIT:  -Follow up (please call to schedule if not scheduled after visit):   -yearly for annual wellness visit with primary care office  Here is a list of your preventive care/health maintenance measures and the plan for each if any are due:  PLAN For any measures below that may be due:   Health Maintenance  Topic Date Due   COVID-19 Vaccine (4 - 2023-24 season) 12/18/2021   INFLUENZA VACCINE  11/18/2022   DTaP/Tdap/Td (2 - Td or Tdap) 05/05/2023   Medicare Annual Wellness (AWV)  10/19/2023   MAMMOGRAM  12/02/2023   Colonoscopy  10/22/2026   Pneumonia Vaccine 27+ Years old  Completed   DEXA SCAN  Completed   Hepatitis C Screening  Completed   Zoster Vaccines- Shingrix  Completed   HPV VACCINES  Aged Out    -See a dentist at least yearly  -Get your eyes checked and then per your eye specialist's recommendations  -Other issues addressed today:   -I have included below further information regarding a healthy whole foods based diet, physical activity guidelines for adults, stress management and opportunities for social connections. I hope you find this information useful.   -----------------------------------------------------------------------------------------------------------------------------------------------------------------------------------------------------------------------------------------------------------  NUTRITION: -eat real food: lots of colorful vegetables (half the plate) and fruits -5-7 servings of vegetables and fruits per day (fresh or steamed is best), exp. 2 servings of vegetables with lunch and dinner and 2 servings of fruit per day. Berries and greens such as kale and collards are great choices.  -consume on a regular basis: whole  grains (make sure first ingredient on label contains the word "whole"), fresh fruits, fish, nuts, seeds, healthy oils (such as olive oil, avocado oil, grape seed oil) -may eat small amounts of dairy and lean meat on occasion, but avoid processed meats such as ham, bacon, lunch meat, etc. -drink water -try to avoid fast food and pre-packaged foods, processed meat -most experts advise limiting sodium to < 2300mg  per day, should limit further is any chronic conditions such as high blood pressure, heart disease, diabetes, etc. The American Heart Association advised that < 1500mg  is is ideal -try to avoid foods that contain any ingredients with names you do not recognize  -try to avoid sugar/sweets (except for the natural sugar that occurs in fresh fruit) -try to avoid sweet drinks -try to avoid white rice, white bread, pasta (unless whole grain), white or yellow potatoes  EXERCISE GUIDELINES FOR ADULTS: -if you wish to increase your physical activity, do so gradually and with the approval of your doctor -STOP and seek medical care immediately if you have any chest pain, chest discomfort or trouble breathing when starting or increasing exercise  -move and stretch your body, legs, feet and arms when sitting for long periods -Physical activity guidelines for optimal health in adults: -least 150 minutes per week of aerobic exercise (can talk, but not sing) once approved by your doctor, 20-30 minutes of sustained activity or two 10 minute episodes of sustained activity every day.  -resistance training at least 2 days per week if approved by your doctor -balance exercises 3+ days per week:   Stand somewhere where you have something sturdy to hold onto if you lose balance.    1) lift up on toes, start with  5x per day and work up to 20x   2) stand and lift on leg straight out to the side so that foot is a few inches of the floor, start with 5x each side and work up to 20x each side   3) stand on one foot,  start with 5 seconds each side and work up to 20 seconds on each side  If you need ideas or help with getting more active:  -Silver sneakers https://tools.silversneakers.com  -Walk with a Doc: http://www.duncan-williams.com/  -try to include resistance (weight lifting/strength building) and balance exercises twice per week: or the following link for ideas: http://castillo-powell.com/  BuyDucts.dk  STRESS MANAGEMENT: -can try meditating, or just sitting quietly with deep breathing while intentionally relaxing all parts of your body for 5 minutes daily -if you need further help with stress, anxiety or depression please follow up with your primary doctor or contact the wonderful folks at [facility]: [phone]  SOCIAL CONNECTIONS: -options in [place] if you wish to engage in more social and exercise related activities:  -Silver sneakers https://tools.silversneakers.com  -Walk with a Doc: http://www.duncan-williams.com/  -Check out the [facility] Active Adults 50+ section on the [place] [employer] (hiking clubs, book clubs, cards and games, chess, exercise classes, aquatic classes and much more) - see the website for details: https://www.[place].gov/departments/parks-recreation/active-adults50  -YouTube has lots of exercise videos for different ages and abilities as well  -[facility] (a variety of indoor and outdoor inperson activities for adults). [phone]. [address]  -Virtual Online Classes (a variety of topics): see seniorplanet.org or call [phone]  -consider volunteering at a school, hospice center, church, senior center or elsewhere

## 2022-10-19 NOTE — Progress Notes (Signed)
PATIENT CHECK-IN and HEALTH RISK ASSESSMENT QUESTIONNAIRE:  -completed by phone/video for upcoming Medicare Preventive Visit  Pre-Visit Check-in: 1)Vitals (height, wt, BP, etc) - record in vitals section for visit on day of visit 2)Review and Update Medications, Allergies PMH, Surgeries, Social history in Epic 3)Hospitalizations in the last year with date/reason?  No   4)Review and Update Care Team (patient's specialists) in Epic 5) Complete PHQ9 in Epic  6) Complete Fall Screening in Epic 7)Review all Health Maintenance Due and order under PCP if not done.  8)Medicare Wellness Questionnaire: Answer theses question about your habits: Do you drink alcohol? Yes  If yes, how many drinks do you have a day?once every 6 months Have you ever smoked?no  Quit date if applicable? N/a  How many packs a day do/did you smoke?  N/a  Do you use smokeless tobacco? no  Do you use an illicit drugs? no  Do you exercises?  Was doing well but recently dealing with sciatic - seeing pcp for this later this week, has silver sneakers.  Are you sexually active?  No Number of partners?n/a Typical breakfast: peanut butter toast or oatmeal  Typical lunch sometimes does not until late  Typical dinner meat and vegetables Typical snacks: pistachios, nuts, cookies   Beverages: water or un sweet tea, coffee  Answer theses question about you: Can you perform most household chores? Yes  Do you find it hard to follow a conversation in a noisy room? No  Do you often ask people to speak up or repeat themselves? No  Do you feel that you have a problem with memory? No  Do you balance your checkbook and or bank acounts? She can but does it online  Do you feel safe at home? Yes  Last dentist visit? year ago.  Do you need assistance with any of the following: Please note if so no   Driving?  Feeding yourself?  Getting from bed to chair?  Getting to the toilet?  Bathing or showering?  Dressing yourself?  Managing  money?  Climbing a flight of stairs  Preparing meals?  Do you have Advanced Directives in place (Living Will, Healthcare Power or Attorney)? No, is working on it    Last eye Exam and location? Year ago, eye doctor    Do you currently use prescribed or non-prescribed narcotic or opioid pain medications?no   Do you have a history or close family history of breast, ovarian, tubal or peritoneal cancer or a family member with BRCA (breast cancer susceptibility 1 and 2) gene mutations? No   Nurse/Assistant Credentials/time stamp: Tora Perches CMA 4:24 pm   ----------------------------------------------------------------------------------------------------------------------------------------------------------------------------------------------------------------------   MEDICARE ANNUAL PREVENTIVE VISIT WITH PROVIDER: (Welcome to Medicare, initial annual wellness or annual wellness exam)  Virtual Visit via Video Note  I connected with Latoyya Shain on 10/19/22 by a video enabled telemedicine application and verified that I am speaking with the correct person using two identifiers.  Location patient: home Location provider:work or home office Persons participating in the virtual visit: patient, provider  Concerns and/or follow up today: reports doing well. Works at [employer] - eats lots of veggies. Needs to get back to exercising - has silver sneakers. Dealing with leg pain - seeing PCP later this wee for this. Feels is sciatica. Pain in R buttock and some radiation to the leg, improving. Denies swelling or redness in leg, known injury. Does not report weakness or numbness.    See HM section in Epic for other details of completed  HM.    ROS: negative for report of fevers, unintentional weight loss, vision changes, vision loss, hearing loss or change, chest pain, sob, hemoptysis, melena, hematochezia, hematuria, falls, bleeding or bruising, thoughts of suicide or self harm, memory  loss  Patient-completed extensive health risk assessment - reviewed and discussed with the patient: See Health Risk Assessment completed with patient prior to the visit either above or in recent phone note. This was reviewed in detailed with the patient today and appropriate recommendations, orders and referrals were placed as needed per Summary below and patient instructions.   Review of Medical History: -PMH, PSH, Family History and current specialty and care providers reviewed and updated and listed below   Patient Care Team: Karie Georges, MD as PCP - General (Family Medicine) Nahser, Deloris Ping, MD as PCP - Cardiology (Cardiology)   Past Medical History:  Diagnosis Date   Adenomatous colon polyp 09/04/2014   Asthma    albuterol-rescue inhaler-uses 1-2 x year   Bone disease, 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   Sheria Lang lesion, chronic    on sm bowel capsule 06/2014   Closed fracture of lateral portion of left tibial plateau 07/23/2016   Complication of anesthesia    slow to awaken   Fundic gland polyps of stomach, benign    GERD (gastroesophageal reflux disease)    no meds   Hiatal hernia    History of kidney stones 09/2017   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   Iron deficiency anemia due to chronic blood loss 07/23/2014   Vitamin D deficiency 08/26/2016    Past Surgical History:  Procedure Laterality Date   ANTERIOR AND POSTERIOR REPAIR  02/22/2011   Procedure: ANTERIOR (CYSTOCELE) AND POSTERIOR REPAIR (RECTOCELE);  Surgeon: Purcell Nails, MD;  Location: [facility];  Service: Gynecology;  Laterality: N/A;   COLONOSCOPY  06/13/2014   CYSTOSCOPY  02/22/2011   Procedure: CYSTOSCOPY;  Surgeon: Purcell Nails, MD;  Location: [facility];  Service: Gynecology;  Laterality: N/A;   DILATION AND CURETTAGE OF UTERUS  04/19/1978   EXTERNAL FIXATION LEG Left 07/24/2016   Procedure: EXTERNAL FIXATION LEFT LOWER LEG;  Surgeon: Samson Frederic, MD;  Location: [facility]  OR;  Service: Orthopedics;  Laterality: Left;   HARDWARE REMOVAL Left 10/01/2019   Procedure: HARDWARE REMOVAL TIBIA;  Surgeon: Myrene Galas, MD;  Location[facility];  Service: Orthopedics;  Laterality: Left;   ORIF TIBIA PLATEAU Left 07/27/2016   Procedure: OPEN REDUCTION INTERNAL FIXATION (ORIF) TIBIAL PLATEAU;  Surgeon: Myrene Galas, MD;  Location: [facility] OR;  Service: Orthopedics;  Laterality: Left;   SALPINGOOPHORECTOMY  02/22/2011   Procedure: SALPINGO OOPHERECTOMY;  Surgeon: Purcell Nails, MD;  Location: [facility];  Service: Gynecology;  Laterality: Bilateral;   UPPER GASTROINTESTINAL ENDOSCOPY  06/13/2014   VAGINAL HYSTERECTOMY  02/22/2011   Procedure: HYSTERECTOMY VAGINAL;  Surgeon: Purcell Nails, MD;  Location: [facility];  Service: Gynecology;  Laterality: N/A;   WISDOM TOOTH EXTRACTION      Social History   Socioeconomic History   Marital status: Single    Spouse name: Not on file   Number of children: 4   Years of education: Not on file   Highest education level: Not on file  Occupational History   Occupation: [employer]  Tobacco Use   Smoking status: Never   Smokeless tobacco: Never  Vaping Use   Vaping Use: Never used  Substance and Sexual Activity   Alcohol use: Not Currently  Comment: Rarely   Drug use: No   Sexual activity: Not on file  Other Topics Concern   Not on file  Social History Narrative   Work or School: Programme researcher, broadcasting/film/video, Dealer      Home Situation: lives alone      Spiritual Beliefs: Erline Hau witness      Lifestyle: no regular exercise; trying to eat healthy      Social Determinants of Health   Financial Resource Strain: Low Risk  (10/16/2021)   Overall Financial Resource Strain (CARDIA)    Difficulty of Paying Living Expenses: Not hard at all  Food Insecurity: No Food Insecurity (10/16/2021)   Hunger Vital Sign    Worried About Running Out of Food in the Last Year: Never true    Ran Out of Food in the Last Year: Never  true  Transportation Needs: No Transportation Needs (10/16/2021)   PRAPARE - Administrator, Civil Service (Medical): No    Lack of Transportation (Non-Medical): No  Physical Activity: Sufficiently Active (10/16/2021)   Exercise Vital Sign    Days of Exercise per Week: 5 days    Minutes of Exercise per Session: 30 min  Stress: No Stress Concern Present (10/16/2021)   [employer] of Occupational Health - Occupational Stress Questionnaire    Feeling of Stress : Not at all  Social Connections: Socially Isolated (10/16/2021)   Social Connection and Isolation Panel [NHANES]    Frequency of Communication with Friends and Family: More than three times a week    Frequency of Social Gatherings with Friends and Family: More than three times a week    Attends Religious Services: Never    Database administrator or Organizations: No    Attends Banker Meetings: Never    Marital Status: Separated  Intimate Partner Violence: Not At Risk (10/16/2021)   Humiliation, Afraid, Rape, and Kick questionnaire    Fear of Current or Ex-Partner: No    Emotionally Abused: No    Physically Abused: No    Sexually Abused: No    Family History  Problem Relation Age of Onset   Other Mother 38       murdered   Diabetes Father    Lung cancer Sister 27   Diabetes Mellitus I Sister    Diabetes Sister    Other Brother 46       murdered   Heart disease Maternal Grandmother    Diabetes Maternal Grandmother    Heart disease Maternal Grandfather    Kidney disease Maternal Grandfather    Diabetes Paternal Grandmother    Brain cancer Paternal Grandfather    Colon cancer Neg Hx    Colon polyps Neg Hx    Esophageal cancer Neg Hx    Gallbladder disease Neg Hx    Breast cancer Neg Hx    Rectal cancer Neg Hx    Stomach cancer Neg Hx     Current Outpatient Medications on File Prior to Visit  Medication Sig Dispense Refill   albuterol (PROVENTIL) (2.5 MG/3ML) 0.083% nebulizer solution  Take 3 mLs (2.5 mg total) by nebulization every 6 (six) hours as needed for wheezing or shortness of breath. 120 mL 0   albuterol (VENTOLIN HFA) 108 (90 Base) MCG/ACT inhaler Inhale 2 puffs into the lungs every 6 (six) hours as needed for wheezing or shortness of breath. 1 each 5   CALCIUM PO Take by mouth.     Magnesium 250 MG TABS Take 250 mg by  mouth daily in the afternoon.     VITAMIN D PO Take 6,000 Units by mouth daily. With K2 100 mcg     benzonatate (TESSALON) 100 MG capsule Take 1 capsule (100 mg total) by mouth 2 (two) times daily as needed for cough. 20 capsule 0   No current facility-administered medications on file prior to visit.    Allergies  Allergen Reactions   No Known Allergies        Physical Exam There were no vitals filed for this visit. Estimated body mass index is 30.8 kg/m as calculated from the following:   Height as of 07/06/22: 5\' 1"  (1.549 m).   Weight as of 07/06/22: 163 lb (73.9 kg).  EKG (optional): deferred due to virtual visit  GENERAL: alert, oriented, no acute distress detected, full vision exam deferred due to pandemic and/or virtual encounter  HEENT: atraumatic, conjunttiva clear, no obvious abnormalities on inspection of external nose and ears  NECK: normal movements of the head and neck  LUNGS: on inspection no signs of respiratory distress, breathing rate appears normal, no obvious gross SOB, gasping or wheezing  CV: no obvious cyanosis  MS: moves all visible extremities without noticeable abnormality  PSYCH/NEURO: pleasant and cooperative, no obvious depression or anxiety, speech and thought processing grossly intact, Cognitive function grossly intact  Flowsheet Row Office Visit from 04/22/2022 in [facility] [place]  PHQ-9 Total Score 5           04/22/2022   12:58 PM 10/16/2021    9:35 AM 10/14/2020    1:58 PM 10/14/2020    1:54 PM 06/06/2020    2:59 PM  Depression screen PHQ 2/9  Decreased Interest 0 0 0  0 0  Down, Depressed, Hopeless 0 0 0 0 0  PHQ - 2 Score 0 0 0 0 0  Altered sleeping 3      Tired, decreased energy 2      Change in appetite 0      Feeling bad or failure about yourself  0      Trouble concentrating 0      Moving slowly or fidgety/restless 0      Suicidal thoughts 0      PHQ-9 Score 5      Difficult doing work/chores Not difficult at all      Only recently due to sciatic pain flare, now improving.      06/06/2020    2:59 PM 10/14/2020    1:57 PM 10/16/2021    9:39 AM 10/18/2022    4:34 PM 10/19/2022    4:19 PM  Fall Risk  Falls in the past year? 1 0 0 0 0  Was there an injury with Fall? 1 0 0  0  Fall Risk Category Calculator 2 0 0  0  Fall Risk Category (Retired) Moderate Low Low    (RETIRED) Patient Fall Risk Level  Low fall risk Low fall risk    Patient at Risk for Falls Due to   No Fall Risks  No Fall Risks  Fall risk Follow up     Falls evaluation completed     SUMMARY AND PLAN:  Encounter for Medicare annual wellness exam   Discussed applicable health maintenance/preventive health measures and advised and referred or ordered per patient preferences: -discussed covid vaccine recommendations, can get boosters at the pharmacy  Health Maintenance  Topic Date Due   COVID-19 Vaccine (4 - 2023-24 season) 12/18/2021   INFLUENZA VACCINE  11/18/2022  DTaP/Tdap/Td (2 - Td or Tdap) 05/05/2023   Medicare Annual Wellness (AWV)  10/19/2023   MAMMOGRAM  12/02/2023   Colonoscopy  10/22/2026   Pneumonia Vaccine 27+ Years old  Completed   DEXA SCAN  Completed   Hepatitis C Screening  Completed   Zoster Vaccines- Shingrix  Completed   HPV VACCINES  Aged [employer] and counseling on the following was provided based on the above review of health and a plan/checklist for the patient, along with additional information discussed, was provided for the patient in the patient instructions :  -Advised on importance of completing advanced directives, discussed  options for completing and provided information in patient instructions as well -Provided safe balance exercises in patient instructions that can be done at home to improve balance and discussed exercise guidelines for adults with include balance exercises at least 3 days per week.  -Advised and counseled on a healthy lifestyle  -Reviewed patient's current diet. Advised and counseled on a whole foods based healthy diet. A summary of a healthy diet was provided in the Patient Instructions.  -reviewed patient's current physical activity level and discussed exercise guidelines for adults. Discussed community resources and ideas for safe exercise at home to assist in meeting exercise guideline recommendations in a safe and healthy way. She has silver sneakers. Plans to get back into once evaluated later this week with PCP for leg pain - discussed arm exercises she can do in the interim.  -Advise yearly dental visits at minimum and regular eye exams   Follow up: see patient instructions     Patient Instructions  I really enjoyed getting to talk with you today! I am available on Tuesdays and Thursdays for virtual visits if you have any questions or concerns, or if I can be of any further assistance.   CHECKLIST FROM ANNUAL WELLNESS VISIT:  -Follow up (please call to schedule if not scheduled after visit):   -yearly for annual wellness visit with primary care office  Here is a list of your preventive care/health maintenance measures and the plan for each if any are due:  PLAN For any measures below that may be due:   Health Maintenance  Topic Date Due   COVID-19 Vaccine (4 - 2023-24 season) 12/18/2021   INFLUENZA VACCINE  11/18/2022   DTaP/Tdap/Td (2 - Td or Tdap) 05/05/2023   Medicare Annual Wellness (AWV)  10/19/2023   MAMMOGRAM  12/02/2023   Colonoscopy  10/22/2026   Pneumonia Vaccine 42+ Years old  Completed   DEXA SCAN  Completed   Hepatitis C Screening  Completed   Zoster Vaccines-  Shingrix  Completed   HPV VACCINES  Aged Out    -See a dentist at least yearly  -Get your eyes checked and then per your eye specialist's recommendations  -Other issues addressed today:   -I have included below further information regarding a healthy whole foods based diet, physical activity guidelines for adults, stress management and opportunities for social connections. I hope you find this information useful.   -----------------------------------------------------------------------------------------------------------------------------------------------------------------------------------------------------------------------------------------------------------  NUTRITION: -eat real food: lots of colorful vegetables (half the plate) and fruits -5-7 servings of vegetables and fruits per day (fresh or steamed is best), exp. 2 servings of vegetables with lunch and dinner and 2 servings of fruit per day. Berries and greens such as kale and collards are great choices.  -consume on a regular basis: whole grains (make sure first ingredient on label contains the word "whole"), fresh fruits, fish, nuts, seeds,  healthy oils (such as olive oil, avocado oil, grape seed oil) -may eat small amounts of dairy and lean meat on occasion, but avoid processed meats such as ham, bacon, lunch meat, etc. -drink water -try to avoid fast food and pre-packaged foods, processed meat -most experts advise limiting sodium to < 2300mg  per day, should limit further is any chronic conditions such as high blood pressure, heart disease, diabetes, etc. The American Heart Association advised that < 1500mg  is is ideal -try to avoid foods that contain any ingredients with names you do not recognize  -try to avoid sugar/sweets (except for the natural sugar that occurs in fresh fruit) -try to avoid sweet drinks -try to avoid white rice, white bread, pasta (unless whole grain), white or yellow potatoes  EXERCISE GUIDELINES FOR  ADULTS: -if you wish to increase your physical activity, do so gradually and with the approval of your doctor -STOP and seek medical care immediately if you have any chest pain, chest discomfort or trouble breathing when starting or increasing exercise  -move and stretch your body, legs, feet and arms when sitting for long periods -Physical activity guidelines for optimal health in adults: -least 150 minutes per week of aerobic exercise (can talk, but not sing) once approved by your doctor, 20-30 minutes of sustained activity or two 10 minute episodes of sustained activity every day.  -resistance training at least 2 days per week if approved by your doctor -balance exercises 3+ days per week:   Stand somewhere where you have something sturdy to hold onto if you lose balance.    1) lift up on toes, start with 5x per day and work up to 20x   2) stand and lift on leg straight out to the side so that foot is a few inches of the floor, start with 5x each side and work up to 20x each side   3) stand on one foot, start with 5 seconds each side and work up to 20 seconds on each side  If you need ideas or help with getting more active:  -Silver sneakers https://tools.silversneakers.com  -Walk with a Doc: http://www.duncan-williams.com/  -try to include resistance (weight lifting/strength building) and balance exercises twice per week: or the following link for ideas: http://castillo-powell.com/  BuyDucts.dk  STRESS MANAGEMENT: -can try meditating, or just sitting quietly with deep breathing while intentionally relaxing all parts of your body for 5 minutes daily -if you need further help with stress, anxiety or depression please follow up with your primary doctor or contact the wonderful folks at [facility]: [phone]  SOCIAL CONNECTIONS: -options in [place] if you wish to engage in more  social and exercise related activities:  -Silver sneakers https://tools.silversneakers.com  -Walk with a Doc: http://www.duncan-williams.com/  -Check out the [facility] Active Adults 50+ section on the [place] of [employer] (hiking clubs, book clubs, cards and games, chess, exercise classes, aquatic classes and much more) - see the website for details: https://www.[place].gov/departments/parks-recreation/active-adults50  -YouTube has lots of exercise videos for different ages and abilities as well  -[facility] (a variety of indoor and outdoor inperson activities for adults). [phone]. [address]  -Virtual Online Classes (a variety of topics): see seniorplanet.org or call [phone]  -consider volunteering at a school, hospice center, church, senior center or elsewhere           Terressa Koyanagi, DO

## 2022-10-22 ENCOUNTER — Encounter: Payer: No Typology Code available for payment source | Admitting: Family Medicine

## 2022-10-26 ENCOUNTER — Encounter: Payer: Self-pay | Admitting: Family Medicine

## 2022-10-26 ENCOUNTER — Ambulatory Visit (INDEPENDENT_AMBULATORY_CARE_PROVIDER_SITE_OTHER): Payer: No Typology Code available for payment source | Admitting: Family Medicine

## 2022-10-26 VITALS — BP 110/80 | HR 60 | Temp 98.2°F | Ht 61.25 in | Wt 162.7 lb

## 2022-10-26 DIAGNOSIS — D500 Iron deficiency anemia secondary to blood loss (chronic): Secondary | ICD-10-CM

## 2022-10-26 DIAGNOSIS — Z780 Asymptomatic menopausal state: Secondary | ICD-10-CM

## 2022-10-26 DIAGNOSIS — E559 Vitamin D deficiency, unspecified: Secondary | ICD-10-CM

## 2022-10-26 DIAGNOSIS — Z13220 Encounter for screening for lipoid disorders: Secondary | ICD-10-CM

## 2022-10-26 DIAGNOSIS — Z1231 Encounter for screening mammogram for malignant neoplasm of breast: Secondary | ICD-10-CM | POA: Diagnosis not present

## 2022-10-26 DIAGNOSIS — J4520 Mild intermittent asthma, uncomplicated: Secondary | ICD-10-CM | POA: Diagnosis not present

## 2022-10-26 DIAGNOSIS — Z0000 Encounter for general adult medical examination without abnormal findings: Secondary | ICD-10-CM | POA: Diagnosis not present

## 2022-10-26 DIAGNOSIS — R739 Hyperglycemia, unspecified: Secondary | ICD-10-CM

## 2022-10-26 DIAGNOSIS — M810 Age-related osteoporosis without current pathological fracture: Secondary | ICD-10-CM | POA: Diagnosis not present

## 2022-10-26 LAB — LIPID PANEL
Cholesterol: 212 mg/dL — ABNORMAL HIGH (ref 0–200)
HDL: 39.2 mg/dL (ref >39.00)
LDL Cholesterol: 144 mg/dL — ABNORMAL HIGH (ref 0–99)
NonHDL: 173.09
Total CHOL/HDL Ratio: 5
Triglycerides: 145 mg/dL (ref 0.0–149.0)
VLDL: 29 mg/dL (ref 0.0–40.0)

## 2022-10-26 LAB — CBC WITH DIFFERENTIAL/PLATELET
Basophils Absolute: 0.1 10*3/uL (ref 0.0–0.1)
Basophils Relative: 0.9 % (ref 0.0–3.0)
Eosinophils Absolute: 0.3 10*3/uL (ref 0.0–0.7)
Eosinophils Relative: 5.3 % — ABNORMAL HIGH (ref 0.0–5.0)
HCT: 41.9 % (ref 36.0–46.0)
Hemoglobin: 13.7 g/dL (ref 12.0–15.0)
Lymphocytes Relative: 31.9 % (ref 12.0–46.0)
Lymphs Abs: 1.8 10*3/uL (ref 0.7–4.0)
MCHC: 32.7 g/dL (ref 30.0–36.0)
MCV: 93.3 fl (ref 78.0–100.0)
Monocytes Absolute: 0.4 10*3/uL (ref 0.1–1.0)
Monocytes Relative: 7.4 % (ref 3.0–12.0)
Neutro Abs: 3.1 10*3/uL (ref 1.4–7.7)
Neutrophils Relative %: 54.5 % (ref 43.0–77.0)
Platelets: 258 10*3/uL (ref 150.0–400.0)
RBC: 4.49 Mil/uL (ref 3.87–5.11)
RDW: 13.2 % (ref 11.5–15.5)
WBC: 5.7 10*3/uL (ref 4.0–10.5)

## 2022-10-26 LAB — VITAMIN D 25 HYDROXY (VIT D DEFICIENCY, FRACTURES): VITD: 26.5 ng/mL — ABNORMAL LOW (ref 30.00–100.00)

## 2022-10-26 LAB — HEMOGLOBIN A1C: Hgb A1c MFr Bld: 5.6 % (ref 4.6–6.5)

## 2022-10-26 MED ORDER — ALBUTEROL SULFATE HFA 108 (90 BASE) MCG/ACT IN AERS: 2.0000 | INHALATION_SPRAY | Freq: Four times a day (QID) | RESPIRATORY_TRACT | 5 refills | Status: AC | PRN

## 2022-10-26 NOTE — Progress Notes (Signed)
Complete physical exam  Patient: Angel Irwin   DOB: [birth year]   72 y.o. Female  MRN: [id number]  Subjective:    Chief Complaint  Patient presents with   Annual Exam    Angel Irwin is a 72 y.o. female who presents today for a complete physical exam. She reports consuming a general diet. Home exercise routine includes see below. She generally feels well. She reports sleeping well. She does have additional problems to discuss today.   Patient states that she is concerned about her weight. She reports she tried keto for about 1-2 months and also tried weight watchers for about 2 months, states that she didn't lose any weight with either. States she has been exercising for an hour a day with silver sneakers and it is not really working for her.   Most recent fall risk assessment:    10/26/2022    9:54 AM  Fall Risk   Falls in the past year? 0  Number falls in past yr: 0  Injury with Fall? 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   CALCIUM PO Take by mouth.   Magnesium 250 MG TABS Take 250 mg by mouth daily in the afternoon.   VITAMIN D PO Take 6,000 Units by mouth daily. With K2 100 mcg   [DISCONTINUED] albuterol (VENTOLIN HFA) 108 (90 Base) MCG/ACT inhaler Inhale 2 puffs into the lungs every 6 (six) hours as needed for wheezing or shortness of breath.   [DISCONTINUED] benzonatate (TESSALON) 100 MG capsule Take 1 capsule (100 mg total) by mouth 2 (two) times daily as needed for cough.   No facility-administered medications prior to visit.    Review of Systems  HENT:  Negative for hearing loss.   Eyes:  Negative for blurred vision.  Respiratory:  Negative for shortness of breath.   Cardiovascular:  Negative for chest pain.  Gastrointestinal: Negative.   Genitourinary: Negative.   Musculoskeletal:  Negative for back pain.  Neurological:  Negative for headaches.  Psychiatric/Behavioral:  Negative for depression.   All other systems reviewed and are negative.      Objective:     BP 110/80 (BP Location: Left Arm, Patient Position: Sitting, Cuff Size: Normal)   Pulse 60   Temp 98.2 F (36.8 C) (Oral)   Ht 5' 1.25" (1.556 m)   Wt 162 lb 11.2 oz (73.8 kg)   SpO2 95%  BMI 30.49 kg/m    Physical Exam Vitals reviewed.  Constitutional:      Appearance: Normal appearance. She is well-groomed. She is obese.  HENT:     Right Ear: Tympanic membrane and ear canal normal.     Left Ear: Tympanic membrane and ear canal normal.     Mouth/Throat:     Mouth: Mucous membranes are moist.     Pharynx: No posterior oropharyngeal erythema.  Eyes:     Conjunctiva/sclera: Conjunctivae normal.  Neck:     Thyroid: No thyromegaly.  Cardiovascular:     Rate and Rhythm: Normal rate and regular rhythm.     Pulses: Normal pulses.     Heart sounds: S1 normal and S2 normal.  Pulmonary:     Effort: Pulmonary effort is normal.     Breath sounds: Normal breath sounds and air entry.  Abdominal:     General:  Bowel sounds are normal.  Musculoskeletal:     Right lower leg: No edema.     Left lower leg: No edema.  Neurological:     Mental Status: She is alert and oriented to person, place, and time. Mental status is at baseline.     Gait: Gait is intact.  Psychiatric:        Mood and Affect: Mood and affect normal.        Speech: Speech normal.        Behavior: Behavior normal.        Judgment: Judgment normal.      No results found for any visits on 10/26/22.     Assessment & Plan:    Routine Health Maintenance and Physical Exam  Immunization History  Administered Date(s) Administered   Fluad Quad(high Dose 65+) 06/06/2020   Influenza, High Dose Seasonal PF 02/19/2016, 03/09/2018   Influenza,inj,Quad PF,6+ Mos 05/04/2013, 04/08/2014, 05/28/2015   PFIZER(Purple Top)SARS-COV-2 Vaccination 05/21/2019, 06/18/2019, 09/11/2020   Pneumococcal Conjugate-13 02/19/2016   Pneumococcal Polysaccharide-23 09/08/2017   Tdap 05/04/2013   Zoster Recombinant(Shingrix) 06/17/2020, 09/11/2020    Health Maintenance  Topic Date Due   COVID-19 Vaccine (4 - 2023-24 season) 11/11/2022 (Originally 12/18/2021)   INFLUENZA VACCINE  11/18/2022   DTaP/Tdap/Td (2 - Td or Tdap) 05/05/2023   Medicare Annual Wellness (AWV)  10/19/2023   MAMMOGRAM  12/02/2023   Colonoscopy  10/22/2026   Pneumonia Vaccine 42+ Years old  Completed   DEXA SCAN  Completed   Hepatitis C Screening  Completed   Zoster Vaccines- Shingrix  Completed   HPV VACCINES  Aged Out    Discussed health benefits of physical activity, and encouraged her to engage in regular exercise appropriate for her age and condition.  Vitamin D deficiency -     VITAMIN D 25 Hydroxy (Vit-D Deficiency, Fractures)  Mild intermittent chronic asthma without complication -     Albuterol Sulfate HFA; Inhale 2 puffs into the lungs every 6 (six) hours as needed for wheezing or shortness of breath.  Dispense: 1 each; Refill: 5  Iron deficiency anemia due to  chronic blood loss -     CBC with Differential/Platelet; Future  Lipid screening -     Lipid panel; Future  Breast cancer screening by mammogram -     3D Screening Mammogram, Left and Right; Future  Postmenopausal state -     DG Bone Density; Future  Osteoporosis, unspecified osteoporosis type, unspecified pathological fracture presence -     DG Bone Density; Future  Hyperglycemia -     Hemoglobin A1c  Routine general medical examination at a health care facility  Physical exam findings are at baseline, I have ordered her annual labs for surveillance and will screen for DM today, pt reports elevated blood sugars at home after meals. Counselled patient on the low carb diet, handouts for healthy eating and exercise given to patient.   Return in 1 year (on 10/26/2023).     Karie Georges, MD

## 2022-10-26 NOTE — Patient Instructions (Signed)

## 2022-12-06 ENCOUNTER — Ambulatory Visit
Admission: RE | Admit: 2022-12-06 | Discharge: 2022-12-06 | Disposition: A | Discharge status: Home / Self Care | Payer: No Typology Code available for payment source | Source: Ambulatory Visit | Attending: Family Medicine | Admitting: Family Medicine

## 2022-12-06 DIAGNOSIS — Z1231 Encounter for screening mammogram for malignant neoplasm of breast: Secondary | ICD-10-CM

## 2023-01-06 ENCOUNTER — Encounter: Payer: Self-pay | Admitting: Cardiovascular Disease

## 2023-01-06 NOTE — Progress Notes (Signed)
Cardiology Office Note:    Date:  01/07/2023   ID:  Angel Irwin, DOB 07-Jul-1950, MRN [id number]  PCP:  Karie Georges, MD   [facility] Providers Cardiologist:  Nori Winegar    Referring MD: Karie Georges, MD   Chief Complaint  Patient presents with   Palpitations    History of Present Illness:    Angel Irwin is a 72 y.o. female with a hx of  bradycardia . We are asked to see her for further evaluation of her bradycardia  The patient was into see her primary medical doctor for further evaluation of symptoms of upper respiratory tract infection.  She was noted to have a heart rate of 37 at that time.  Her doctor commented that her previous EKG showed ventricular bigeminy.  Wears her fit bit  -she occasionally gets a heart rate reading of 34 on her Fitbit.  She denies any symptoms of syncope or presyncope. 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  No CP , no dyspnea  No dyspnea with shopping or yard work . Bowls on Mondays   1 minute step test: At the patient's step up and down on the examination table bench for 1 to 2 minutes.  Her heart rate started at 37 and then slowly increased for up to the 40s and low 50s.  Then it suddenly increased to 110-115. On examination her PVCs have resolved.  As she was in the recovery phase she started having recurrent PVCs again.   March 19,2024 Angel Irwin is seen for further episods of her frequent PVCs 3 day event monitor revealed a significant PVC burden of 26.5% Echo shows normal LV EF with EF 55-60% Mild MR   She cannot feel any palpitations   Does craft shows [employer]   Sept 20 2024  Angel Irwin is seen for follow up of her PVCs Her PVC burden is 26% but her LV function is normal we are just watching for now   Is active Silver sneakers online   Will have her see EP  (Dr. Jimmey Ralph )     Past Medical History:  Diagnosis Date   Adenomatous colon polyp 09/04/2014   Asthma    albuterol-rescue inhaler-uses 1-2 x year   Bone disease, 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   Sheria Lang lesion, chronic    on sm bowel capsule 06/2014   Closed fracture of lateral portion of left tibial plateau 07/23/2016   Complication of anesthesia    slow to awaken   Fundic gland polyps of stomach, benign    GERD (gastroesophageal reflux disease)    no meds   Hiatal hernia    History of kidney stones 09/2017   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   Iron deficiency anemia due to chronic blood loss 07/23/2014   Vitamin D deficiency 08/26/2016    Past Surgical History:  Procedure Laterality Date   ANTERIOR AND POSTERIOR REPAIR  02/22/2011   Procedure: ANTERIOR (CYSTOCELE) AND POSTERIOR REPAIR (RECTOCELE);  Surgeon: Purcell Nails, MD;  Location: [facility];  Service: Gynecology;  Laterality: N/A;   COLONOSCOPY  06/13/2014   CYSTOSCOPY  02/22/2011   Procedure: CYSTOSCOPY;  Surgeon: Purcell Nails, MD;  Location: [facility];  Service: Gynecology;  Laterality: N/A;   DILATION AND CURETTAGE OF UTERUS  04/19/1978   EXTERNAL FIXATION LEG Left 07/24/2016   Procedure: EXTERNAL FIXATION LEFT LOWER LEG;  Surgeon: Samson Frederic, MD;  Location: [facility] OR;  Service: Orthopedics;  Laterality: Left;   HARDWARE REMOVAL Left 10/01/2019   Procedure: HARDWARE REMOVAL TIBIA;  Surgeon: Myrene Galas, MD;  Location[facility];  Service: Orthopedics;  Laterality: Left;   ORIF TIBIA PLATEAU Left 07/27/2016   Procedure: OPEN REDUCTION INTERNAL FIXATION (ORIF) TIBIAL PLATEAU;  Surgeon: Myrene Galas, MD;  Location: [facility];  Service: Orthopedics;  Laterality: Left;   SALPINGOOPHORECTOMY  02/22/2011   Procedure: SALPINGO OOPHERECTOMY;  Surgeon: Purcell Nails, MD;  Location: [facility];  Service: Gynecology;  Laterality: Bilateral;   UPPER GASTROINTESTINAL ENDOSCOPY  06/13/2014   VAGINAL  HYSTERECTOMY  02/22/2011   Procedure: HYSTERECTOMY VAGINAL;  Surgeon: Purcell Nails, MD;  Location: [facility];  Service: Gynecology;  Laterality: N/A;   WISDOM TOOTH EXTRACTION      Current Medications: Current Meds  Medication Sig   albuterol (PROVENTIL) (2.5 MG/3ML) 0.083% nebulizer solution Take 3 mLs (2.5 mg total) by nebulization every 6 (six) hours as needed for wheezing or shortness of breath.   albuterol (VENTOLIN HFA) 108 (90 Base) MCG/ACT inhaler Inhale 2 puffs into the lungs every 6 (six) hours as needed for wheezing or shortness of breath.   CALCIUM PO Take by mouth.   Magnesium 250 MG TABS Take 250 mg by mouth daily in the afternoon.   VITAMIN D PO Take 6,000 Units by mouth daily. With K2 100 mcg     Allergies:   No known allergies   Social History   Socioeconomic History   Marital status: Single    Spouse name: Not on file   Number of children: 4   Years of education: Not on file   Highest education level: Not on file  Occupational History   Occupation: Advice worker  Tobacco Use   Smoking status: Never   Smokeless tobacco: Never  Vaping Use   Vaping status: Never Used  Substance and Sexual Activity   Alcohol use: Not Currently    Comment: Rarely   Drug use: No   Sexual activity: Not on file  Other Topics Concern   Not on file  Social History Narrative   Work or School: Programme researcher, broadcasting/film/video, Dealer      Home Situation: lives alone      Spiritual Beliefs: Jahovah witness      Lifestyle: no regular exercise; trying to eat healthy      Social Determinants of Health   Financial Resource Strain: Low Risk  (10/16/2021)   Overall Financial Resource Strain (CARDIA)    Difficulty of Paying Living Expenses: Not hard at all  Food Insecurity: No Food Insecurity (10/16/2021)   Hunger Vital Sign    Worried About Running Out of Food in the Last Year: Never true    Ran Out of Food in the Last Year: Never true  Transportation Needs: No Transportation  Needs (10/16/2021)   PRAPARE - Administrator, Civil Service (Medical): No    Lack of Transportation (Non-Medical): No  Physical Activity: Sufficiently Active (10/16/2021)   Exercise Vital Sign    Days of Exercise per Week: 5 days    Minutes of Exercise per Session: 30 min  Stress: No Stress Concern Present (10/16/2021)   [employer] of Occupational Health - Occupational Stress Questionnaire    Feeling of Stress : Not at all  Social Connections: Socially Isolated (10/16/2021)  Social Advertising account executive [NHANES]    Frequency of Communication with Friends and Family: More than three times a week    Frequency of Social Gatherings with Friends and Family: More than three times a week    Attends Religious Services: Never    Database administrator or Organizations: No    Attends Engineer, structural: Never    Marital Status: Separated     Family History: The patient's family history includes Brain cancer in her paternal grandfather; Diabetes in her father, maternal grandmother, paternal grandmother, and sister; Diabetes Mellitus I in her sister; Heart disease in her maternal grandfather and maternal grandmother; Kidney disease in her maternal grandfather; Lung cancer (age of onset: 68) in her sister; Other (age of onset: 56) in her brother; Other (age of onset: 67) in her mother. There is no history of Colon cancer, Colon polyps, Esophageal cancer, Gallbladder disease, Breast cancer, Rectal cancer, or Stomach cancer.  ROS:   Please see the history of present illness.     All other systems reviewed and are negative.  EKGs/Labs/Other Studies Reviewed:    The following studies were reviewed today:   EKG:         Recent Labs: 05/13/2022: BUN 15; Creatinine, Ser 0.76; Magnesium 2.2; Potassium 4.4; Sodium 141; TSH 3.370 10/26/2022: Hemoglobin 13.7; Platelets 258.0  Recent Lipid Panel    Component Value Date/Time   CHOL 212 (H) 10/26/2022 1043   TRIG  145.0 10/26/2022 1043   HDL 39.20 10/26/2022 1043   CHOLHDL 5 10/26/2022 1043   VLDL 29.0 10/26/2022 1043   LDLCALC 144 (H) 10/26/2022 1043   LDLCALC 137 (H) 06/06/2020 1629   LDLDIRECT 146.7 04/27/2013 0825     Risk Assessment/Calculations:            Physical Exam:    Physical Exam: Blood pressure 124/70, pulse (!) 41, height 5\' 1"  (1.549 m), weight 160 lb (72.6 kg), SpO2 96%.       GEN:  Well nourished, well developed in no acute distress HEENT: Normal NECK: No JVD; No carotid bruits LYMPHATICS: No lymphadenopathy CARDIAC: Regularly irregular in about geminal pattern.  RRR , no murmurs, rubs, gallops RESPIRATORY:  Clear to auscultation without rales, wheezing or rhonchi  ABDOMEN: Soft, non-tender, non-distended MUSCULOSKELETAL:  No edema; No deformity  SKIN: Warm and dry NEUROLOGIC:  Alert and oriented x 3    ASSESSMENT:    1. PVC's (premature ventricular contractions)   2. Premature ventricular contractions      PLAN:      Frequent premature ventricular contractions: Angel Irwin presents for further evaluation of her frequent premature ventricular contractions.  She has a 26% PVC burden by event monitor.  I had her do a step test during her last visit and her PVCs resolved when her heart rate increased.  I am hesitant to start her on a beta-blocker for that reason.  We could consider trying flecainide. I think at this point I would like for her to see electrophysiology.  Will set her up to see Dr. Jimmey Ralph for follow-up consultation/opinion.  I will see her on an as-needed basis.      Medication Adjustments/Labs and Tests Ordered: Current medicines are reviewed at length with the patient today.  Concerns regarding medicines are outlined above.  Orders Placed This Encounter  Procedures   Ambulatory referral to Cardiac Electrophysiology   No orders of the defined types were placed in this encounter.   Patient Instructions  Medication Instructions:  Your  physician recommends that you continue on your current medications as directed. Please refer to the Current Medication list given to you today.  *If you need a refill on your cardiac medications before your next appointment, please call your pharmacy*  Lab Work: NONE If you have labs (blood work) drawn today and your tests are completely normal, you will receive your results only by: MyChart Message (if you have MyChart) OR A paper copy in the mail If you have any lab test that is abnormal or we need to change your treatment, we will call you to review the results.  Testing/Procedures: Ambulatory referral to EP Jimmey Ralph)  Follow-Up: At [facility], you and your health needs are our priority.  As part of our continuing mission to provide you with exceptional heart care, we have created designated Provider Care Teams.  These Care Teams include your primary Cardiologist (physician) and Advanced Practice Providers (APPs -  Physician Assistants and Nurse Practitioners) who all work together to provide you with the care you need, when you need it.  Your next appointment:   As Needed  Provider:   Kristeen Miss, MD        Signed, Kristeen Miss, MD  01/07/2023 9:58 AM    [facility]

## 2023-01-07 ENCOUNTER — Ambulatory Visit
Payer: No Typology Code available for payment source | Attending: Cardiovascular Disease | Admitting: Cardiovascular Disease

## 2023-01-07 ENCOUNTER — Encounter: Payer: Self-pay | Admitting: Cardiovascular Disease

## 2023-01-07 VITALS — BP 124/70 | HR 41 | Ht 61.0 in | Wt 160.0 lb

## 2023-01-07 DIAGNOSIS — I493 Ventricular premature depolarization: Secondary | ICD-10-CM | POA: Diagnosis not present

## 2023-01-07 NOTE — Patient Instructions (Signed)
Medication Instructions:  Your physician recommends that you continue on your current medications as directed. Please refer to the Current Medication list given to you today.  *If you need a refill on your cardiac medications before your next appointment, please call your pharmacy*  Lab Work: NONE If you have labs (blood work) drawn today and your tests are completely normal, you will receive your results only by: MyChart Message (if you have MyChart) OR A paper copy in the mail If you have any lab test that is abnormal or we need to change your treatment, we will call you to review the results.  Testing/Procedures: Ambulatory referral to EP Jimmey Ralph)  Follow-Up: At [facility], you and your health needs are our priority.  As part of our continuing mission to provide you with exceptional heart care, we have created designated Provider Care Teams.  These Care Teams include your primary Cardiologist (physician) and Advanced Practice Providers (APPs -  Physician Assistants and Nurse Practitioners) who all work together to provide you with the care you need, when you need it.  Your next appointment:   As Needed  Provider:   Kristeen Miss, MD

## 2023-01-10 ENCOUNTER — Ambulatory Visit (INDEPENDENT_AMBULATORY_CARE_PROVIDER_SITE_OTHER): Payer: No Typology Code available for payment source

## 2023-01-10 DIAGNOSIS — Z23 Encounter for immunization: Secondary | ICD-10-CM

## 2023-01-31 NOTE — Progress Notes (Unsigned)
  Electrophysiology Office Note:   Date:  02/01/2023  ID:  Raiya Stainback, DOB 01-Jun-1950, MRN [id number]  Primary Cardiologist: Kristeen Miss, MD Electrophysiologist: Nobie Putnam, MD      History of Present Illness:   Devi Hopman is a 72 y.o. female with h/o bradycardia and PVCs who is seen today for EP evaluation at the request of Dr. Elease Hashimoto.   Patient reports long history of PVCs. She occasionally has palpitations at rest, less so with activity. She does endorse some fatigue. No dizziness, lightheadedness or syncope. She is active. No chest pain.   Review of systems complete and found to be negative unless listed in HPI.   EP Information / Studies Reviewed:    EKG is ordered today. Personal review as below. EKG Interpretation Date/Time:  Tuesday February 01 2023 11:35:12 EDT Ventricular Rate:  69 PR Interval:  138 QRS Duration:  70 QT Interval:  364 QTC Calculation: 390 R Axis:   60  Text Interpretation: Sinus rhythm with frequent Premature ventricular complexes in a pattern of bigeminy Nonspecific T wave abnormality Confirmed by Nobie Putnam [phone]) on 02/01/2023 12:14:34 PM   EKG 04/23/22:   Zio 05/26/22:  26.5% PVC burden  Echo 06/07/22:  IMPRESSIONS   1. Frequent PVCs throughout study.   2. Left ventricular ejection fraction, by estimation, is 55 to 60%. The  left ventricle has normal function. Left ventricular diastolic parameters  are indeterminate.   3. Right ventricular systolic function is normal. The right ventricular  size is normal.   4. Mild mitral valve regurgitation.   5. The aortic valve is tricuspid. Aortic valve regurgitation is not  visualized. Aortic valve sclerosis is present, with no evidence of aortic  valve stenosis.   Physical Exam:   VS:  BP 124/66   Pulse 69   Ht 5\' 1"  (1.549 m)   Wt 162 lb 3.2 oz (73.6 kg)   SpO2 97%   BMI 30.65 kg/m    Wt Readings from Last 3 Encounters:  02/01/23 162 lb 3.2 oz (73.6 kg)  01/07/23 160 lb  (72.6 kg)  10/26/22 162 lb 11.2 oz (73.8 kg)     GEN: Well nourished, well developed in no acute distress NECK: No JVD; No carotid bruits CARDIAC: Normal rate, regularly irregular, no murmurs, rubs, gallops RESPIRATORY:  Clear to auscultation without rales, wheezing or rhonchi  ABDOMEN: Soft, non-tender, non-distended EXTREMITIES:  No edema; No deformity   ASSESSMENT AND PLAN:   Carly Sabo is a 72 y.o. female with h/o bradycardia and PVCs who is seen today for EP evaluation at the request of Dr. Elease Hashimoto.   #. 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  - Start metoprolol XL 12.5mg  once daily. Limited by low baseline resting HR.  - Start flecainide 50mg  twice daily. Stress test scheduled. No history of CAD or chest pain. - Will repeat Zio monitor in a few weeks to assess response.  - Patient not interested in ablation procedure at this time.   Follow up with Dr. Jimmey Ralph  in 3 months.    Total time of encounter: 65 minutes total time of encounter, including face-to-face chart review, patient care, coordination of care and counseling regarding high complexity medical decision making.   Signed, Nobie Putnam, MD

## 2023-02-01 ENCOUNTER — Encounter: Payer: Self-pay | Admitting: Cardiology

## 2023-02-01 ENCOUNTER — Ambulatory Visit: Payer: No Typology Code available for payment source | Attending: Cardiology | Admitting: Cardiology

## 2023-02-01 ENCOUNTER — Telehealth: Payer: Self-pay

## 2023-02-01 VITALS — BP 124/66 | HR 69 | Ht 61.0 in | Wt 162.2 lb

## 2023-02-01 DIAGNOSIS — I493 Ventricular premature depolarization: Secondary | ICD-10-CM | POA: Diagnosis not present

## 2023-02-01 DIAGNOSIS — R002 Palpitations: Secondary | ICD-10-CM

## 2023-02-01 MED ORDER — METOPROLOL SUCCINATE ER 25 MG PO TB24: 12.5000 mg | ORAL_TABLET | Freq: Every day | ORAL | 3 refills | Status: DC

## 2023-02-01 MED ORDER — FLECAINIDE ACETATE 50 MG PO TABS: 50.0000 mg | ORAL_TABLET | Freq: Two times a day (BID) | ORAL | 3 refills | Status: DC

## 2023-02-01 NOTE — Patient Instructions (Signed)
Medication Instructions:  Your physician has recommended you make the following change in your medication:  1) START taking Toprol XL (metoprolol succinate) 12.5 mg daily  2) START taking flecainide 50 mg twice daily  *If you need a refill on your cardiac medications before your next appointment, please call your pharmacy*  Testing/Procedures: IN ONE WEEK: Your physician has requested that you have an exercise tolerance test.   IN ONE MONTH: Your physician has recommended that you wear an event monitor. Event monitors are medical devices that record the heart's electrical activity. Doctors most often [place] these monitors to diagnose arrhythmias. Arrhythmias are problems with the speed or rhythm of the heartbeat. The monitor is a small, portable device. You can wear one while you do your normal daily activities. This is usually used to diagnose what is causing palpitations/syncope (passing out).  Follow-Up: At [facility], you and your health needs are our priority.  As part of our continuing mission to provide you with exceptional heart care, we have created designated Provider Care Teams.  These Care Teams include your primary Cardiologist (physician) and Advanced Practice Providers (APPs -  Physician Assistants and Nurse Practitioners) who all work together to provide you with the care you need, when you need it.  Your next appointment:   3 month(s)  Provider:   Nobie Putnam, MD    Other Instructions Angel Irwin- Long Term Monitor Instructions  Your physician has requested you wear a ZIO patch monitor for 14 days.  This is a single patch monitor. [employer] supplies one patch monitor per enrollment. Additional stickers are not available. Please do not apply patch if you will be having a Nuclear Stress Test,  Echocardiogram, Cardiac CT, MRI, or Chest Xray during the period you would be wearing the  monitor. The patch cannot be worn during these tests. You cannot remove and re-apply the   ZIO XT patch monitor.  Your ZIO patch monitor will be mailed 3 day USPS to your address on file. It may take 3-5 days  to receive your monitor after you have been enrolled.  Once you have received your monitor, please review the enclosed instructions. Your monitor  has already been registered assigning a specific monitor serial # to you.  Billing and Patient Assistance Program Information  We have supplied [employer] with any of your insurance information on file for billing purposes. [employer] offers a sliding scale Patient Assistance Program for patients that do not have  insurance, or whose insurance does not completely cover the cost of the ZIO monitor.  You must apply for the Patient Assistance Program to qualify for this discounted rate.  To apply, please call [employer] at [phone], select option 4, select option 2, ask to apply for  Patient Assistance Program. Angel Irwin will ask your household income, and how many people  are in your household. They will quote your out-of-pocket cost based on that information.  [employer] will also be able to set up a 32-month, interest-free payment plan if needed.  Applying the monitor   Shave hair from upper left chest.  Hold abrader disc by orange tab. Rub abrader in 40 strokes over the upper left chest as  indicated in your monitor instructions.  Clean area with 4 enclosed alcohol pads. Let dry.  Apply patch as indicated in monitor instructions. Patch will be placed under collarbone on left  side of chest with arrow pointing upward.  Rub patch adhesive wings for 2 minutes. Remove white label marked "1". Remove  the white  label marked "2". Rub patch adhesive wings for 2 additional minutes.  While looking in a mirror, press and release button in center of patch. A small green light will  flash 3-4 times. This will be your only indicator that the monitor has been turned on.  Do not shower for the first 24 hours. You may shower after the first 24 hours.   Press the button if you feel a symptom. You will hear a small click. Record Date, Time and  Symptom in the Patient Logbook.  When you are ready to remove the patch, follow instructions on the last 2 pages of Patient  Logbook. Stick patch monitor onto the last page of Patient Logbook.  Place Patient Logbook in the blue and white box. Use locking tab on box and tape box closed  securely. The blue and white box has prepaid postage on it. Please place it in the mailbox as  soon as possible. Your physician should have your test results approximately 7 days after the  monitor has been mailed back to [facility].  Call [facility] Customer Care at [phone] if you have questions regarding  your ZIO XT patch monitor. Call them immediately if you see an orange light blinking on your  monitor.  If your monitor falls off in less than 4 days, contact our Monitor department at [phone].  If your monitor becomes loose or falls off after 4 days call [employer] at [phone] for  suggestions on securing your monitor

## 2023-02-01 NOTE — Addendum Note (Signed)
Addended by: Frutoso Schatz on: 02/01/2023 02:15 PM   Modules accepted: Orders

## 2023-02-01 NOTE — Telephone Encounter (Signed)
Spoke with the patient and she is aware of changes to plans for testing.

## 2023-02-01 NOTE — Telephone Encounter (Signed)
Left message for patient to call back.   Dr. Jimmey Ralph would like for the patient to have a nuclear stress test instead of an plain Exercise tolerance test. She will still need an EKG for flecainide monitoring in one week. This is scheduled for next Tuesday at 2:00pm

## 2023-02-01 NOTE — Telephone Encounter (Signed)
Patient is returning phone call.  °

## 2023-02-02 ENCOUNTER — Telehealth (HOSPITAL_COMMUNITY): Payer: Self-pay | Admitting: *Deleted

## 2023-02-02 NOTE — Telephone Encounter (Signed)
Patient given detailed instructions per Myocardial Perfusion Study Information Sheet for the test on 02/09/23 Patient notified to arrive 15 minutes early and that it is imperative to arrive on time for appointment to keep from having the test rescheduled.  If you need to cancel or reschedule your appointment, please call the office within 24 hours of your appointment. . Patient verbalized understanding.Ricky Ala

## 2023-02-07 ENCOUNTER — Telehealth: Payer: Self-pay | Admitting: Cardiovascular Disease

## 2023-02-07 NOTE — Telephone Encounter (Signed)
Patient canceled Nurse's visit on 10/22 due to family emergency.

## 2023-02-08 ENCOUNTER — Ambulatory Visit: Payer: No Typology Code available for payment source

## 2023-02-09 ENCOUNTER — Ambulatory Visit (HOSPITAL_COMMUNITY): Payer: No Typology Code available for payment source | Attending: Cardiology

## 2023-02-09 DIAGNOSIS — R002 Palpitations: Secondary | ICD-10-CM | POA: Insufficient documentation

## 2023-02-09 DIAGNOSIS — I493 Ventricular premature depolarization: Secondary | ICD-10-CM | POA: Diagnosis not present

## 2023-02-09 LAB — MYOCARDIAL PERFUSION IMAGING
Base ST Depression (mm): 0 mm
LV dias vol: 78 mL (ref 46–106)
LV sys vol: 35 mL
Nuc Stress EF: 56 %
Peak HR: 105 {beats}/min
Rest HR: 47 {beats}/min
Rest Nuclear Isotope Dose: 8.4 mCi
SDS: 2
SRS: 0
SSS: 2
ST Depression (mm): 0 mm
Stress Nuclear Isotope Dose: 26.1 mCi
TID: 0.92

## 2023-02-09 MED ORDER — TECHNETIUM TC 99M TETROFOSMIN IV KIT
26.1000 | PACK | Freq: Once | INTRAVENOUS | Status: AC | PRN
  Administered 2023-02-09: 26.1 via INTRAVENOUS

## 2023-02-09 MED ORDER — TECHNETIUM TC 99M TETROFOSMIN IV KIT
8.4000 | PACK | Freq: Once | INTRAVENOUS | Status: AC | PRN
  Administered 2023-02-09: 8.4 via INTRAVENOUS

## 2023-02-09 MED ORDER — REGADENOSON 0.4 MG/5ML IV SOLN
0.4000 mg | Freq: Once | INTRAVENOUS | Status: AC
Start: 2023-02-09 — End: 2023-02-09
  Administered 2023-02-09: 0.4 mg via INTRAVENOUS

## 2023-02-21 DIAGNOSIS — H35363 Drusen (degenerative) of macula, bilateral: Secondary | ICD-10-CM | POA: Diagnosis not present

## 2023-02-21 DIAGNOSIS — H25813 Combined forms of age-related cataract, bilateral: Secondary | ICD-10-CM | POA: Diagnosis not present

## 2023-02-21 DIAGNOSIS — H0265 Xanthelasma of left lower eyelid: Secondary | ICD-10-CM | POA: Diagnosis not present

## 2023-02-21 DIAGNOSIS — H5213 Myopia, bilateral: Secondary | ICD-10-CM | POA: Diagnosis not present

## 2023-02-22 ENCOUNTER — Encounter: Payer: Self-pay | Admitting: Family Medicine

## 2023-02-28 ENCOUNTER — Other Ambulatory Visit: Payer: Self-pay

## 2023-02-28 ENCOUNTER — Ambulatory Visit: Payer: No Typology Code available for payment source | Attending: Cardiology

## 2023-02-28 DIAGNOSIS — I493 Ventricular premature depolarization: Secondary | ICD-10-CM

## 2023-02-28 NOTE — Progress Notes (Signed)
2 week monitor 1 month after starting flecainide and metoprolol

## 2023-02-28 NOTE — Progress Notes (Unsigned)
Enrolled for [employer] to mail a ZIO XT long term holter monitor to the patients address on file.  

## 2023-03-06 DIAGNOSIS — I493 Ventricular premature depolarization: Secondary | ICD-10-CM | POA: Diagnosis not present

## 2023-03-07 DIAGNOSIS — H52223 Regular astigmatism, bilateral: Secondary | ICD-10-CM | POA: Diagnosis not present

## 2023-03-07 DIAGNOSIS — H524 Presbyopia: Secondary | ICD-10-CM | POA: Diagnosis not present

## 2023-03-24 ENCOUNTER — Encounter (HOSPITAL_BASED_OUTPATIENT_CLINIC_OR_DEPARTMENT_OTHER): Payer: Self-pay

## 2023-03-24 ENCOUNTER — Emergency Department (HOSPITAL_BASED_OUTPATIENT_CLINIC_OR_DEPARTMENT_OTHER)
Admission: EM | Admit: 2023-03-24 | Discharge: 2023-03-24 | Disposition: A | Discharge status: Home / Self Care | Payer: No Typology Code available for payment source | Attending: Emergency Medicine | Admitting: Emergency Medicine

## 2023-03-24 ENCOUNTER — Emergency Department (HOSPITAL_BASED_OUTPATIENT_CLINIC_OR_DEPARTMENT_OTHER): Payer: No Typology Code available for payment source

## 2023-03-24 DIAGNOSIS — M25522 Pain in left elbow: Secondary | ICD-10-CM | POA: Diagnosis present

## 2023-03-24 DIAGNOSIS — S52125A Nondisplaced fracture of head of left radius, initial encounter for closed fracture: Secondary | ICD-10-CM | POA: Diagnosis not present

## 2023-03-24 DIAGNOSIS — S52122A Displaced fracture of head of left radius, initial encounter for closed fracture: Secondary | ICD-10-CM | POA: Diagnosis not present

## 2023-03-24 DIAGNOSIS — M8580 Other specified disorders of bone density and structure, unspecified site: Secondary | ICD-10-CM | POA: Diagnosis not present

## 2023-03-24 DIAGNOSIS — W1830XA Fall on same level, unspecified, initial encounter: Secondary | ICD-10-CM | POA: Diagnosis not present

## 2023-03-24 DIAGNOSIS — M25422 Effusion, left elbow: Secondary | ICD-10-CM | POA: Diagnosis not present

## 2023-03-24 DIAGNOSIS — S42402A Unspecified fracture of lower end of left humerus, initial encounter for closed fracture: Secondary | ICD-10-CM | POA: Diagnosis not present

## 2023-03-24 DIAGNOSIS — W19XXXA Unspecified fall, initial encounter: Secondary | ICD-10-CM

## 2023-03-24 NOTE — ED Triage Notes (Signed)
Pt c/o mechanical fall approx 2p. Pt states "one minute I was standing, the next I was on the ground." States she landed on L side. "I thought it was my wrist at first, but now I can't really move my arm, so I know it's that elbow.

## 2023-03-24 NOTE — ED Provider Notes (Signed)
[place] [facility]ERGENCY DEPARTMENT AT [facility] Provider Note   CSN: [id number] Arrival date & time: 03/24/23  1946     History Chief Complaint  Patient presents with   Fall   Elbow Injury    L    HPI Angel Irwin is a 72 y.o. female presenting for ground-level fall.  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  Has left elbow pain.  Denies any other injuries or sites of pain.  Did not hit her head..   Patient's recorded medical, surgical, social, medication list and allergies were reviewed in the Snapshot window as part of the initial history.   Review of Systems   Review of Systems  Constitutional:  Negative for chills and fever.  HENT:  Negative for ear pain and sore throat.   Eyes:  Negative for pain and visual disturbance.  Respiratory:  Negative for cough and shortness of breath.   Cardiovascular:  Negative for chest pain and palpitations.  Gastrointestinal:  Negative for abdominal pain and vomiting.  Genitourinary:  Negative for dysuria and hematuria.  Musculoskeletal:  Negative for arthralgias and back pain.  Skin:  Negative for color change and rash.  Neurological:  Negative for seizures and syncope.  All other systems reviewed and are negative.   Physical Exam Updated Vital Signs There were no vitals taken for this visit. Physical Exam Vitals and nursing note reviewed.  Constitutional:      General: She is not in acute distress.    Appearance: She is well-developed.  HENT:     Head: Normocephalic and atraumatic.  Eyes:     Conjunctiva/sclera: Conjunctivae normal.  Cardiovascular:     Rate and Rhythm: Normal rate and regular rhythm.     Heart sounds: No murmur heard. Pulmonary:     Effort: Pulmonary effort is normal. No respiratory distress.     Breath sounds: Normal breath sounds.  Abdominal:     General: There is no distension.     Palpations: Abdomen is soft.     Tenderness: There is no abdominal  tenderness. There is no right CVA tenderness or left CVA tenderness.  Musculoskeletal:        General: Tenderness (TTP left elbow.) present. No swelling. Normal range of motion.     Cervical back: Neck supple.  Skin:    General: Skin is warm and dry.  Neurological:     General: No focal deficit present.     Mental Status: She is alert and oriented to person, place, and time. Mental status is at baseline.     Cranial Nerves: No cranial nerve deficit.      ED Course/ Medical Decision Making/ A&P    Procedures .Ortho Injury Treatment  Date/Time: 03/24/2023 10:13 PM  Performed by: Glyn Ade, MD Authorized by: Glyn Ade, MD   Consent:    Consent obtained:  Verbal   Consent given by:  Patient   Risks discussed:  Fracture   Alternatives discussed:  No treatment, alternative treatment and referralPre-procedure neurovascular assessment: neurovascularly intact Immobilization: splint Splint Applied by: ED Provider and Ortho Tech Supplies used: Ortho-Glass Post-procedure neurovascular assessment: post-procedure neurovascularly intact      Medications Ordered in ED Medications - No data to display  Medical Decision Making:   72 year old female ground-level fall. X-ray shows a radial head fracture.  Neurovascularly intact otherwise.  Long-arm splint, sling. Remain neurovascularly intact after splint placement.  Recommended follow-up with orthopedics in 1 week for  definitive care and management.    Disposition:  I have considered need for hospitalization, however, considering all of the above, I believe this patient is stable for discharge at this time.  Patient/family educated about specific return precautions for given chief complaint and symptoms.  Patient/family educated about follow-up with PCP.     Patient/family expressed understanding of return precautions and need for follow-up. Patient spoken to regarding all imaging and laboratory results and appropriate  follow up for these results. All education provided in verbal form with additional information in written form. Time was allowed for answering of patient questions. Patient discharged.    Emergency Department Medication Summary:   Medications - No data to display      Clinical Impression:  1. Fall, initial encounter   2. Closed fracture of left elbow, initial encounter      Data Unavailable   Final Clinical Impression(s) / ED Diagnoses Final diagnoses:  Fall, initial encounter  Closed fracture of left elbow, initial encounter    Rx / DC Orders ED Discharge Orders     None         Glyn Ade, MD 03/24/23 2213

## 2023-03-29 ENCOUNTER — Other Ambulatory Visit (INDEPENDENT_AMBULATORY_CARE_PROVIDER_SITE_OTHER): Payer: No Typology Code available for payment source

## 2023-03-29 ENCOUNTER — Encounter: Payer: Self-pay | Admitting: Physician Assistant

## 2023-03-29 ENCOUNTER — Ambulatory Visit (INDEPENDENT_AMBULATORY_CARE_PROVIDER_SITE_OTHER): Payer: No Typology Code available for payment source | Admitting: Physician Assistant

## 2023-03-29 DIAGNOSIS — S52123A Displaced fracture of head of unspecified radius, initial encounter for closed fracture: Secondary | ICD-10-CM | POA: Insufficient documentation

## 2023-03-29 DIAGNOSIS — S52125G Nondisplaced fracture of head of left radius, subsequent encounter for closed fracture with delayed healing: Secondary | ICD-10-CM | POA: Diagnosis not present

## 2023-03-29 DIAGNOSIS — M25522 Pain in left elbow: Secondary | ICD-10-CM | POA: Diagnosis not present

## 2023-03-29 DIAGNOSIS — S52122A Displaced fracture of head of left radius, initial encounter for closed fracture: Secondary | ICD-10-CM | POA: Insufficient documentation

## 2023-03-29 DIAGNOSIS — I493 Ventricular premature depolarization: Secondary | ICD-10-CM | POA: Diagnosis not present

## 2023-03-29 NOTE — Progress Notes (Signed)
Office Visit Note   Patient: Angel Irwin           Date of Birth: 12/07/1950           MRN: [id number] Visit Date: 03/29/2023              Requested by: Karie Georges, MD [address] [place],  [place] [address] PCP: Karie Georges, MD   Assessment & Plan: Visit Diagnoses:  1. Pain in left elbow   2. Closed nondisplaced fracture of head of left radius with delayed healing, subsequent encounter     Plan: Nondisplaced intra-articular left radial head fracture.  She is doing well I did review the x-rays with Dr. Roda Shutters.  She will continue to be immobilized in the sling for another 5 days.  At that time she can come out and start doing extension and flexion exercises with her elbow.  Will follow-up back in 2 weeks for new x-rays.  I also did warn her that she is still wearing rings on her left hand and she had does have some swelling in her fingers.  She is aware of this and she said they have actually been less swollen and she is able to move the rings around and would prefer not to have them cut off  Follow-Up Instructions: No follow-ups on file.   Orders:  Orders Placed This Encounter  Procedures   XR Elbow 2 Views Left   No orders of the defined types were placed in this encounter.     Procedures: No procedures performed   Clinical Data: No additional findings.   Subjective: Chief Complaint  Patient presents with   Left Elbow - Fracture    HPI patient is a pleasant right-hand-dominant woman who had a fall she tripped over a bag handle while trying up to set up for a craft show about a week ago.  She was seen and evaluated in the emergency room which demonstrated a nondisplaced intra-articular radial head fracture of the left elbow.  She has been immobilized in a splint since  Review of Systems  All other systems reviewed and are negative.    Objective: Vital Signs: There were no vitals taken for this visit.  Physical Exam Constitutional:       Appearance: Normal appearance.  Skin:    General: Skin is warm and dry.  Neurological:     General: No focal deficit present.     Mental Status: She is alert and oriented to person, place, and time.  Psychiatric:        Mood and Affect: Mood normal.        Behavior: Behavior normal.     Ortho Exam Examination of her left elbow she does have some slight swelling in her hands.  Brisk capillary refill less than 2 seconds radial pulses are intact she does have good extension and flexion of her elbow.  She has resolving swelling and ecchymosis about the elbow.  Compartments are soft Specialty Comments:  No specialty comments available.  Imaging: No results found.   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/04/2014   Iron deficiency anemia due to chronic blood loss  07/23/2014   Asthma, chronic 04/08/2014   Past Medical History:  Diagnosis Date   Adenomatous colon polyp 09/04/2014   Asthma    albuterol-rescue inhaler-uses 1-2 x year   Bone disease, 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   Sheria Lang lesion, chronic    on sm bowel capsule 06/2014   Closed fracture of lateral portion of left tibial plateau 07/23/2016   Complication of anesthesia    slow to awaken   Fundic gland polyps of stomach, benign    GERD (gastroesophageal reflux disease)    no meds   Hiatal hernia    History of kidney stones 09/2017   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   Iron deficiency anemia due to chronic blood loss 07/23/2014   Vitamin D deficiency 08/26/2016    Family History  Problem Relation Age of Onset   Other Mother 21       murdered   Diabetes  Father    Lung cancer Sister 17   Diabetes Mellitus I Sister    Diabetes Sister    Other Brother 68       murdered   Heart disease Maternal Grandmother    Diabetes Maternal Grandmother    Heart disease Maternal Grandfather    Kidney disease Maternal Grandfather    Diabetes Paternal Grandmother    Brain cancer Paternal Grandfather    Colon cancer Neg Hx    Colon polyps Neg Hx    Esophageal cancer Neg Hx    Gallbladder disease Neg Hx    Breast cancer Neg Hx    Rectal cancer Neg Hx    Stomach cancer Neg Hx     Past Surgical History:  Procedure Laterality Date   ANTERIOR AND POSTERIOR REPAIR  02/22/2011   Procedure: ANTERIOR (CYSTOCELE) AND POSTERIOR REPAIR (RECTOCELE);  Surgeon: Purcell Nails, MD;  Location: [facility];  Service: Gynecology;  Laterality: N/A;   COLONOSCOPY  06/13/2014   CYSTOSCOPY  02/22/2011   Procedure: CYSTOSCOPY;  Surgeon: Purcell Nails, MD;  Location: [facility];  Service: Gynecology;  Laterality: N/A;   DILATION AND CURETTAGE OF UTERUS  04/19/1978   EXTERNAL FIXATION LEG Left 07/24/2016   Procedure: EXTERNAL FIXATION LEFT LOWER LEG;  Surgeon: Samson Frederic, MD;  Location: [facility] OR;  Service: Orthopedics;  Laterality: Left;   HARDWARE REMOVAL Left 10/01/2019   Procedure: HARDWARE REMOVAL TIBIA;  Surgeon: Myrene Galas, MD;  Location: [facility] OR;  Service: Orthopedics;  Laterality: Left;   ORIF TIBIA PLATEAU Left 07/27/2016   Procedure: OPEN REDUCTION INTERNAL FIXATION (ORIF) TIBIAL PLATEAU;  Surgeon: Myrene Galas, MD;  Location: [facility] OR;  Service: Orthopedics;  Laterality: Left;   SALPINGOOPHORECTOMY  02/22/2011   Procedure: SALPINGO OOPHERECTOMY;  Surgeon: Purcell Nails, MD;  Location: [facility];  Service: Gynecology;  Laterality: Bilateral;   UPPER GASTROINTESTINAL ENDOSCOPY  06/13/2014   VAGINAL HYSTERECTOMY  02/22/2011   Procedure: HYSTERECTOMY VAGINAL;  Surgeon: Purcell Nails, MD;  Location: [facility];  Service: Gynecology;  Laterality: N/A;   WISDOM TOOTH  EXTRACTION     Social History   Occupational History   Occupation: Advice worker  Tobacco Use   Smoking status: Never   Smokeless tobacco: Never  Vaping Use   Vaping status: Never Used  Substance and Sexual Activity   Alcohol use: Not Currently    Comment: Rarely   Drug use: No   Sexual activity: Not on file

## 2023-04-04 ENCOUNTER — Ambulatory Visit (INDEPENDENT_AMBULATORY_CARE_PROVIDER_SITE_OTHER): Payer: No Typology Code available for payment source | Admitting: Family Medicine

## 2023-04-04 ENCOUNTER — Encounter: Payer: Self-pay | Admitting: Family Medicine

## 2023-04-04 VITALS — BP 122/68 | HR 63 | Temp 97.7°F | Ht 61.0 in | Wt 160.0 lb

## 2023-04-04 DIAGNOSIS — M7062 Trochanteric bursitis, left hip: Secondary | ICD-10-CM | POA: Diagnosis not present

## 2023-04-04 MED ORDER — IBUPROFEN 800 MG PO TABS: 800.0000 mg | ORAL_TABLET | Freq: Three times a day (TID) | ORAL | 5 refills | Status: DC | PRN

## 2023-04-04 NOTE — Progress Notes (Signed)
Established Patient Office Visit  Subjective   Patient ID: Angel Irwin, female    DOB: 12-19-1950  Age: 72 y.o. MRN: [id number]  Chief Complaint  Patient presents with   Hip Pain    Patient complains of left lateral hip pain x5 months, "feels bruised" with no known injury    Pt reports she feels like she has a bruise on the left lateral portion of her thigh. States that she had mentioned it before and thought that it was sciatica, however she is having more tenderness to palpation and she has a lot of trouble with laying down at night, states that she can't move it to change position in bed it is so painful.  Has tried naproxen at home, helps some with the pain. Patient states she wants to know what it is so she can treat it properly.    Current Outpatient Medications  Medication Instructions   albuterol (PROVENTIL) 2.5 mg, Nebulization, Every 6 hours PRN   albuterol (VENTOLIN HFA) 108 (90 Base) MCG/ACT inhaler 2 puffs, Inhalation, Every 6 hours PRN   CALCIUM PO Take by mouth.   flecainide (TAMBOCOR) 50 mg, Oral, 2 times daily   ibuprofen (ADVIL) 800 mg, Oral, Every 8 hours PRN, Take with food   Magnesium 250 mg, Daily   VITAMIN D PO 6,000 Units, Daily    Patient Active Problem List   Diagnosis Date Noted   Radial head fracture 03/29/2023   Fracture of radial head, left, closed 03/29/2023   Premature ventricular contractions 01/07/2023   Osteoporosis 09/13/2018   Hemangioma of liver 03/09/2018   Vitamin D deficiency 08/26/2016   Fall 08/26/2016   Hiatal hernia 09/04/2014   Cameron lesion, chronic 09/04/2014   Iron deficiency anemia due to chronic blood loss 07/23/2014   Asthma, chronic 04/08/2014      Review of Systems  All other systems reviewed and are negative.     Objective:     BP 122/68   Pulse 63   Temp 97.7 F (36.5 C) (Oral)   Ht 5\' 1"  (1.549 m)   Wt 160 lb (72.6 kg)   SpO2 96%   BMI 30.23 kg/m    Physical Exam Constitutional:       Appearance: Normal appearance. She is normal weight.  Pulmonary:     Effort: Pulmonary effort is normal.  Musculoskeletal:        General: Tenderness (left lateral hip) present.     Right lower leg: No edema.     Left lower leg: No edema.  Neurological:     Mental Status: She is alert and oriented to person, place, and time. Mental status is at baseline.  Psychiatric:        Mood and Affect: Mood normal.        Behavior: Behavior normal.      No results found for any visits on 04/04/23.    The 10-year ASCVD risk score (Arnett DK, et al., 2019) is: 11.3%    Assessment & Plan:  Trochanteric bursitis of left hip -     Ibuprofen; Take 1 tablet (800 mg total) by mouth every 8 (eight) hours as needed. Take with food  Dispense: 30 tablet; Refill: 5   Will treat with NSAIDS nightly, I advised that if this does not improve then she might consider letting the orthpedist perform an injection in the bursal area.   Return in about 7 months (around 11/02/2023) for annual physical exam.    Karie Georges, MD

## 2023-04-21 ENCOUNTER — Other Ambulatory Visit (INDEPENDENT_AMBULATORY_CARE_PROVIDER_SITE_OTHER): Payer: No Typology Code available for payment source

## 2023-04-21 ENCOUNTER — Encounter: Payer: Self-pay | Admitting: Physician Assistant

## 2023-04-21 ENCOUNTER — Ambulatory Visit: Payer: No Typology Code available for payment source | Admitting: Physician Assistant

## 2023-04-21 DIAGNOSIS — M25522 Pain in left elbow: Secondary | ICD-10-CM

## 2023-04-21 DIAGNOSIS — S52125G Nondisplaced fracture of head of left radius, subsequent encounter for closed fracture with delayed healing: Secondary | ICD-10-CM

## 2023-04-21 NOTE — Progress Notes (Signed)
 Office Visit Note   Patient: Angel Irwin           Date of Birth: Nov 21, 1950           MRN: [id number] Visit Date: 04/21/2023              Requested by: Ozell Heron HERO, MD [address] [place],  [place] [address] PCP: Ozell Heron HERO, MD  Chief Complaint  Patient presents with   Left Elbow - Follow-up      HPI: Patient is a pleasant 73 year old woman who is now 4 weeks status post radial head fracture of the left elbow.  She is actually doing well she discontinued her sling except when she is in high risk situations.  She has been working on extension in her elbow and she feels it improves every day as well as flexion she said that just a few days ago she could not reach her hand behind her head and now she can  Assessment & Plan: Visit Diagnoses:  1. Pain in left elbow   2. Closed nondisplaced fracture of head of left radius with delayed healing, subsequent encounter     Plan: Status post above, doing well.  She has fractured her right elbow in the past and worked with a massage therapist she is gena do this to improve her motion she is lacking a few degrees of full extension and flexion.  She will follow-up with me in 4 weeks if she still having difficulties could also call and get an order for occupational therapy if she feels she needs this.  She understands for the next 3 weeks or so I would prefer her not to lift anything heavy with his left arm  Follow-Up Instructions: No follow-ups on file.   Ortho Exam  Patient is alert, oriented, no adenopathy, well-dressed, normal affect, normal respiratory effort. Examination of her left elbow she neurovascular intact no swelling no redness no erythema minimally tender to palpation over the fracture site she is lacking about 5 degrees of extension and about 5 to 10 degrees of full flexion  Imaging: XR Elbow 2 Views Left Result Date: 04/21/2023 2 view radiographs of her left elbow demonstrate interval healing with  diminished fracture line radial head fracture of the left elbow  No images are attached to the encounter.  Labs: Lab Results  Component Value Date   HGBA1C 5.6 10/26/2022   HGBA1C 5.6 09/08/2017   HGBA1C 5.6 07/19/2017   LABORGA Insignificant Growth 05/14/2014     Lab Results  Component Value Date   ALBUMIN 4.1 10/19/2021   ALBUMIN 3.9 05/11/2019   ALBUMIN 3.9 09/28/2017   PREALBUMIN 14.8 (L) 07/27/2016    Lab Results  Component Value Date   MG 2.2 05/13/2022   MG 1.9 07/27/2016   Lab Results  Component Value Date   VD25OH 26.50 (L) 10/26/2022   VD25OH 30.81 10/19/2021   VD25OH 28 (L) 06/06/2020    Lab Results  Component Value Date   PREALBUMIN 14.8 (L) 07/27/2016      Latest Ref Rng & Units 10/26/2022   10:43 AM 10/19/2021   12:37 PM 06/06/2020    4:29 PM  CBC EXTENDED  WBC 4.0 - 10.5 K/uL 5.7  6.7  9.3   RBC 3.87 - 5.11 Mil/uL 4.49  4.30  4.77   Hemoglobin 12.0 - 15.0 g/dL 86.2  86.6  85.3   HCT 36.0 - 46.0 % 41.9  39.8  43.2   Platelets 150.0 - 400.0  K/uL 258.0  236.0  293   NEUT# 1.4 - 7.7 K/uL 3.1  4.1  6,175   Lymph# 0.7 - 4.0 K/uL 1.8  1.9  2,334      There is no height or weight on file to calculate BMI.  Orders:  Orders Placed This Encounter  Procedures   XR Elbow 2 Views Left   No orders of the defined types were placed in this encounter.    Procedures: No procedures performed  Clinical Data: No additional findings.  ROS:  All other systems negative, except as noted in the HPI. Review of Systems  Objective: Vital Signs: There were no vitals taken for this visit.  Specialty Comments:  No specialty comments available.  PMFS History: Patient Active Problem List   Diagnosis Date Noted   Radial head fracture 03/29/2023   Fracture of radial head, left, closed 03/29/2023   Premature ventricular contractions 01/07/2023   Osteoporosis 09/13/2018   Hemangioma of liver 03/09/2018   Vitamin D  deficiency 08/26/2016   Fall 08/26/2016    Hiatal hernia 09/04/2014   Cameron lesion, chronic 09/04/2014   Iron deficiency anemia due to chronic blood loss 07/23/2014   Asthma, chronic 04/08/2014   Past Medical History:  Diagnosis Date   Adenomatous colon polyp 09/04/2014   Asthma    albuterol -rescue inhaler-uses 1-2 x year   Bone disease, 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   Ole lesion, chronic    on sm bowel capsule 06/2014   Closed fracture of lateral portion of left tibial plateau 07/23/2016   Complication of anesthesia    slow to awaken   Fundic gland polyps of stomach, benign    GERD (gastroesophageal reflux disease)    no meds   Hiatal hernia    History of kidney stones 09/2017   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   Iron deficiency anemia due to chronic blood loss 07/23/2014   Vitamin D  deficiency 08/26/2016    Family History  Problem Relation Age of Onset   Other Mother 67       murdered   Diabetes Father    Lung cancer Sister 83   Diabetes Mellitus I Sister    Diabetes Sister    Other Brother 75       murdered   Heart disease Maternal Grandmother    Diabetes Maternal Grandmother    Heart disease Maternal Grandfather    Kidney disease Maternal Grandfather    Diabetes Paternal Grandmother    Brain cancer Paternal Grandfather    Colon cancer Neg Hx    Colon polyps Neg Hx    Esophageal cancer Neg Hx    Gallbladder disease Neg Hx    Breast cancer Neg Hx    Rectal cancer Neg Hx    Stomach cancer Neg Hx     Past Surgical History:  Procedure Laterality Date   ANTERIOR AND POSTERIOR REPAIR  02/22/2011   Procedure: ANTERIOR (CYSTOCELE) AND POSTERIOR REPAIR (RECTOCELE);  Surgeon: Jon CINDERELLA Rummer, MD;  Location: [facility];  Service: Gynecology;  Laterality: N/A;   COLONOSCOPY  06/13/2014   CYSTOSCOPY  02/22/2011   Procedure: CYSTOSCOPY;  Surgeon: Jon CINDERELLA Rummer, MD;  Location: [facility];  Service: Gynecology;  Laterality: N/A;   DILATION AND CURETTAGE OF UTERUS  04/19/1978    EXTERNAL FIXATION LEG Left 07/24/2016   Procedure: EXTERNAL FIXATION LEFT LOWER LEG;  Surgeon: Redell Shoals, MD;  Location: [facility] OR;  Service: Orthopedics;  Laterality: Left;   HARDWARE  REMOVAL Left 10/01/2019   Procedure: HARDWARE REMOVAL TIBIA;  Surgeon: Celena Sharper, MD;  Location[facility];  Service: Orthopedics;  Laterality: Left;   ORIF TIBIA PLATEAU Left 07/27/2016   Procedure: OPEN REDUCTION INTERNAL FIXATION (ORIF) TIBIAL PLATEAU;  Surgeon: Sharper Celena, MD;  Location[facility];  Service: Orthopedics;  Laterality: Left;   SALPINGOOPHORECTOMY  02/22/2011   Procedure: SALPINGO OOPHERECTOMY;  Surgeon: Jon CINDERELLA Rummer, MD;  Location: [facility];  Service: Gynecology;  Laterality: Bilateral;   UPPER GASTROINTESTINAL ENDOSCOPY  06/13/2014   VAGINAL HYSTERECTOMY  02/22/2011   Procedure: HYSTERECTOMY VAGINAL;  Surgeon: Jon CINDERELLA Rummer, MD;  Location: [facility];  Service: Gynecology;  Laterality: N/A;   WISDOM TOOTH EXTRACTION     Social History   Occupational History   Occupation: Advice Worker  Tobacco Use   Smoking status: Never   Smokeless tobacco: Never  Vaping Use   Vaping status: Never Used  Substance and Sexual Activity   Alcohol use: Not Currently    Comment: Rarely   Drug use: No   Sexual activity: Not on file

## 2023-04-29 ENCOUNTER — Encounter: Payer: Self-pay | Admitting: Cardiology

## 2023-05-12 NOTE — Progress Notes (Addendum)
Electrophysiology Office Note:   Date:  05/14/2023  ID:  Sula Fetterly, DOB 10-14-1950, MRN [id number]  Primary Cardiologist: Kristeen Miss, MD Electrophysiologist: Nobie Putnam, MD      History of Present Illness:   Angel Irwin is a 73 y.o. female with h/o bradycardia and PVCs who is seen for EP evaluation at the request of Dr. Elease Hashimoto.  Patient reports long history of PVCs.  She wore a ZIO monitor in February 2024 and showed PVC burden of 26.5%.  These were associated with palpitations and fatigue.  She was seen by me in clinic on 02/01/2023 for the first time.  She was not interested in catheter ablation at that time so she was started on flecainide 50 mg twice daily in addition to metoprolol.  Repeat Zio monitor was ordered and PVC burden had decreased to 3.8%. They report significant improvement in symptoms, including less breathlessness when climbing stairs. Initially, they took the metoprolol twice daily instead of once daily as prescribed, but have since corrected this. They have not experienced any side effects from the medications.  The patient notes that they did not realize the severity of their condition until after starting the medications. They report a noticeable difference in their energy levels and ability to perform physical tasks, such as lifting heavy items for events. They express satisfaction with the improvement in their condition and are content to continue with the current treatment plan.  Review of systems complete and found to be negative unless listed in HPI.   EP Information / Studies Reviewed:    EKG is ordered today. Personal review as below. EKG Interpretation Date/Time:  Friday May 13 2023 10:41:33 EST Ventricular Rate:  46 PR Interval:  162 QRS Duration:  74 QT Interval:  476 QTC Calculation: 416 R Axis:   57  Text Interpretation: Sinus bradycardia Low voltage QRS Nonspecific T wave abnormality When compared with ECG of 01-Feb-2023 11:35,  Premature ventricular complexes are no longer Present Vent. rate has decreased BY  23 BPM Confirmed by Nobie Putnam [phone]) on 05/14/2023 12:52:22 PM   Nuclear Stress 02/09/23:    Resting ECG shows non specific ST/T wave abnormality   A pharmacological stress test was performed using IV Lexiscan 0.4mg  over 10 seconds performed without concurrent submaximal exercise. The patient reported dizziness and nausea during the stress test. Normal blood pressure and normal heart rate response noted during stress. Heart rate recovery was normal.   No ST deviation was noted.   LV perfusion is normal. There is no evidence of ischemia. There is no evidence of infarction.   Left ventricular function is normal. Nuclear stress EF: 56%. The left ventricular ejection fraction is normal (55-65%). End diastolic cavity size is normal. End systolic cavity size is normal. No evidence of transient ischemic dilation (TID) noted.   The study is normal. The study is low risk.  EKG 04/23/22:   Zio 05/26/22:  26.5% PVC burden  Echo 06/07/22:  IMPRESSIONS   1. Frequent PVCs throughout study.   2. Left ventricular ejection fraction, by estimation, is 55 to 60%. The  left ventricle has normal function. Left ventricular diastolic parameters  are indeterminate.   3. Right ventricular systolic function is normal. The right ventricular  size is normal.   4. Mild mitral valve regurgitation.   5. The aortic valve is tricuspid. Aortic valve regurgitation is not  visualized. Aortic valve sclerosis is present, with no evidence of aortic  valve stenosis.   Physical Exam:   VS:  BP 130/82   Pulse (!) 46   Ht 5\' 1"  (1.549 m)   Wt 164 lb 0.3 oz (74.4 kg)   SpO2 95%   BMI 30.99 kg/m    Wt Readings from Last 3 Encounters:  05/13/23 164 lb 0.3 oz (74.4 kg)  04/04/23 160 lb (72.6 kg)  02/09/23 162 lb (73.5 kg)     GEN: Well nourished, well developed in no acute distress NECK: No JVD CARDIAC: Bradycardic,  regular RESPIRATORY:  Clear to auscultation without rales, wheezing or rhonchi  ABDOMEN: Soft, non-tender, non-distended EXTREMITIES:  No edema; No deformity   ASSESSMENT AND PLAN:   Angel Irwin is a 73 y.o. female with h/o bradycardia and PVCs who is seen today for EP evaluation at the request of Dr. Elease Hashimoto.   #. 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 Burden significant improved after starting flecainide, correlating with symptomatic improvement.  - Continue metoprolol XL 12.5mg  once daily. Limited by low baseline resting HR.  - Continue flecainide 50mg  twice daily.  - Patient not interested in ablation procedure at this time.   #. Sinus bradycardia: Asymptomatic. Reports that she can increase HR adequately during activity, uses Fitbit to monitor.  - Monitor for symptoms of bradycardia, such as fatigue or dizziness - Continue metoprolol 12.5 mg once daily  Follow up with Dr. Jimmey Ralph in 6 months.  Signed, Nobie Putnam, MD

## 2023-05-13 ENCOUNTER — Ambulatory Visit: Payer: No Typology Code available for payment source | Attending: Cardiology | Admitting: Cardiology

## 2023-05-13 VITALS — BP 130/82 | HR 46 | Ht 61.0 in | Wt 164.0 lb

## 2023-05-13 DIAGNOSIS — R001 Bradycardia, unspecified: Secondary | ICD-10-CM | POA: Diagnosis not present

## 2023-05-13 DIAGNOSIS — I493 Ventricular premature depolarization: Secondary | ICD-10-CM

## 2023-05-13 NOTE — Patient Instructions (Signed)
Medication Instructions:  Your physician recommends that you continue on your current medications as directed. Please refer to the Current Medication list given to you today.  *If you need a refill on your cardiac medications before your next appointment, please call your pharmacy*  Follow-Up: At [facility], you and your health needs are our priority.  As part of our continuing mission to provide you with exceptional heart care, we have created designated Provider Care Teams.  These Care Teams include your primary Cardiologist (physician) and Advanced Practice Providers (APPs -  Physician Assistants and Nurse Practitioners) who all work together to provide you with the care you need, when you need it.  Your next appointment:   6 months  Provider:   You may see Nobie Putnam, MD or one of the following Advanced Practice Providers on your designated Care Team:   Francis Dowse, [place] [address]" [place], [place] Sherie Don, NP Canary Brim, NP

## 2023-05-17 ENCOUNTER — Other Ambulatory Visit: Payer: Self-pay | Admitting: Family Medicine

## 2023-05-17 ENCOUNTER — Ambulatory Visit
Admission: RE | Admit: 2023-05-17 | Discharge: 2023-05-17 | Disposition: A | Discharge status: Home / Self Care | Payer: No Typology Code available for payment source | Source: Ambulatory Visit | Attending: Family Medicine | Admitting: Family Medicine

## 2023-05-17 DIAGNOSIS — M810 Age-related osteoporosis without current pathological fracture: Secondary | ICD-10-CM

## 2023-05-17 DIAGNOSIS — N958 Other specified menopausal and perimenopausal disorders: Secondary | ICD-10-CM | POA: Diagnosis not present

## 2023-05-17 DIAGNOSIS — Z780 Asymptomatic menopausal state: Secondary | ICD-10-CM

## 2023-05-17 DIAGNOSIS — Z90722 Acquired absence of ovaries, bilateral: Secondary | ICD-10-CM | POA: Diagnosis not present

## 2023-05-17 DIAGNOSIS — M8588 Other specified disorders of bone density and structure, other site: Secondary | ICD-10-CM | POA: Diagnosis not present

## 2023-05-20 ENCOUNTER — Encounter: Payer: Self-pay | Admitting: Family Medicine

## 2023-05-20 DIAGNOSIS — M810 Age-related osteoporosis without current pathological fracture: Secondary | ICD-10-CM

## 2023-05-23 MED ORDER — ALENDRONATE SODIUM 70 MG PO TABS
70.0000 mg | ORAL_TABLET | ORAL | 1 refills | Status: DC
Start: 2023-05-23 — End: 2023-12-30

## 2023-06-01 DIAGNOSIS — H35363 Drusen (degenerative) of macula, bilateral: Secondary | ICD-10-CM | POA: Diagnosis not present

## 2023-06-01 DIAGNOSIS — H5213 Myopia, bilateral: Secondary | ICD-10-CM | POA: Diagnosis not present

## 2023-06-01 DIAGNOSIS — H0265 Xanthelasma of left lower eyelid: Secondary | ICD-10-CM | POA: Diagnosis not present

## 2023-06-01 DIAGNOSIS — H25813 Combined forms of age-related cataract, bilateral: Secondary | ICD-10-CM | POA: Diagnosis not present

## 2023-11-18 ENCOUNTER — Other Ambulatory Visit: Payer: Self-pay | Admitting: Family Medicine

## 2023-11-18 DIAGNOSIS — Z1231 Encounter for screening mammogram for malignant neoplasm of breast: Secondary | ICD-10-CM

## 2023-12-12 ENCOUNTER — Ambulatory Visit
Admission: RE | Admit: 2023-12-12 | Discharge: 2023-12-12 | Disposition: A | Discharge status: Home / Self Care | Source: Ambulatory Visit | Attending: Family Medicine

## 2023-12-12 DIAGNOSIS — Z1231 Encounter for screening mammogram for malignant neoplasm of breast: Secondary | ICD-10-CM

## 2023-12-14 ENCOUNTER — Ambulatory Visit: Payer: Self-pay | Admitting: Family Medicine

## 2023-12-30 ENCOUNTER — Encounter: Payer: Self-pay | Admitting: Family Medicine

## 2023-12-30 ENCOUNTER — Ambulatory Visit: Admitting: Family Medicine

## 2023-12-30 ENCOUNTER — Ambulatory Visit

## 2023-12-30 ENCOUNTER — Other Ambulatory Visit: Payer: Self-pay

## 2023-12-30 VITALS — BP 120/60 | HR 60 | Temp 98.0°F | Ht 61.0 in | Wt 168.1 lb

## 2023-12-30 DIAGNOSIS — E559 Vitamin D deficiency, unspecified: Secondary | ICD-10-CM | POA: Diagnosis not present

## 2023-12-30 DIAGNOSIS — Z0000 Encounter for general adult medical examination without abnormal findings: Secondary | ICD-10-CM | POA: Diagnosis not present

## 2023-12-30 DIAGNOSIS — M810 Age-related osteoporosis without current pathological fracture: Secondary | ICD-10-CM

## 2023-12-30 DIAGNOSIS — Z13220 Encounter for screening for lipoid disorders: Secondary | ICD-10-CM

## 2023-12-30 DIAGNOSIS — Z23 Encounter for immunization: Secondary | ICD-10-CM | POA: Diagnosis not present

## 2023-12-30 DIAGNOSIS — R739 Hyperglycemia, unspecified: Secondary | ICD-10-CM | POA: Diagnosis not present

## 2023-12-30 MED ORDER — ROMOSOZUMAB-AQQG 105 MG/1.17ML ~~LOC~~ SOSY: 210.0000 mg | PREFILLED_SYRINGE | Freq: Once | SUBCUTANEOUS | Status: DC

## 2023-12-30 NOTE — Progress Notes (Signed)
 Subjective:   Angel Irwin is a 73 y.o. who presents for a Medicare Wellness preventive visit.  As a reminder, Annual Wellness Visits don't include a physical exam, and some assessments may be limited, especially if this visit is performed virtually. We may recommend an in-person follow-up visit with your provider if needed.  Visit Complete: In person    Persons Participating in Visit: Patient.  AWV Questionnaire: No: Patient Medicare AWV questionnaire was not completed prior to this visit.  Cardiac Risk Factors include: advanced age (>26men, >28 women)     Objective:    Today's Vitals   12/30/23 1456  BP: 120/60  Pulse: 60  Temp: 98 F (36.7 C)  TempSrc: Oral  SpO2: 98%  Weight: 168 lb 1.6 oz (76.2 kg)  Height: 5' 1 (1.549 m)   Body mass index is 31.76 kg/m.     12/30/2023    3:13 PM 10/16/2021    9:40 AM 10/14/2020    1:56 PM 10/01/2019    7:03 AM 09/28/2019    8:34 AM 07/29/2017    8:44 AM 07/23/2016    7:35 PM  Advanced Directives  Does Patient Have a Medical Advance Directive? No No Yes No No Yes  No   Type of Best boy of [place];Living will      Copy of Healthcare Power of Attorney in Chart?   No - copy requested      Would patient like information on creating a medical advance directive? No - Patient declined No - Patient declined   Yes (MAU/Ambulatory/Procedural Areas - Information given)  No - Patient declined      Data saved with a previous flowsheet row definition    Current Medications (verified) Outpatient Encounter Medications as of 12/30/2023  Medication Sig   albuterol  (PROVENTIL ) (2.5 MG/3ML) 0.083% nebulizer solution Take 3 mLs (2.5 mg total) by nebulization every 6 (six) hours as needed for wheezing or shortness of breath.   albuterol  (VENTOLIN  HFA) 108 (90 Base) MCG/ACT inhaler Inhale 2 puffs into the lungs every 6 (six) hours as needed for wheezing or shortness of breath.   alendronate  (FOSAMAX ) 70 MG tablet Take  1 tablet (70 mg total) by mouth every 7 (seven) days. Take with a full glass of water on an empty stomach.   CALCIUM PO Take by mouth.   flecainide  (TAMBOCOR ) 50 MG tablet Take 1 tablet (50 mg total) by mouth 2 (two) times daily.   ibuprofen  (ADVIL ) 800 MG tablet Take 1 tablet (800 mg total) by mouth every 8 (eight) hours as needed. Take with food   Magnesium 250 MG TABS Take 250 mg by mouth daily in the afternoon.   metoprolol  succinate (TOPROL -XL) 25 MG 24 hr tablet Take 12.5 mg by mouth daily.   VITAMIN D  PO Take 6,000 Units by mouth daily. With K2 100 mcg   No facility-administered encounter medications on file as of 12/30/2023.    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   Iron deficiency anemia due to chronic blood loss 07/23/2014   Vitamin D  deficiency 08/26/2016   Past Surgical History:  Procedure Laterality Date   ANTERIOR AND POSTERIOR REPAIR  02/22/2011   Procedure: ANTERIOR (CYSTOCELE) AND POSTERIOR REPAIR (RECTOCELE);  Surgeon: Jon CINDERELLA Rummer, MD;  Location: [facility];  Service: Gynecology;  Laterality: N/A;   COLONOSCOPY  06/13/2014   CYSTOSCOPY  02/22/2011   Procedure: CYSTOSCOPY;  Surgeon: Jon CINDERELLA Rummer, MD;  Location: [facility];  Service: Gynecology;  Laterality: N/A;   DILATION AND CURETTAGE OF UTERUS  04/19/1978   EXTERNAL FIXATION LEG Left 07/24/2016   Procedure: EXTERNAL FIXATION LEFT LOWER LEG;   Surgeon: Redell Shoals, MD;  Location: [facility] OR;  Service: Orthopedics;  Laterality: Left;   HARDWARE REMOVAL Left 10/01/2019   Procedure: HARDWARE REMOVAL TIBIA;  Surgeon: Celena Sharper, MD;  Location: [facility] OR;  Service: Orthopedics;  Laterality: Left;   ORIF TIBIA PLATEAU Left 07/27/2016   Procedure: OPEN REDUCTION INTERNAL FIXATION (ORIF) TIBIAL PLATEAU;  Surgeon: Sharper Celena, MD;  Location: [facility] OR;  Service: Orthopedics;  Laterality: Left;   SALPINGOOPHORECTOMY  02/22/2011   Procedure: SALPINGO OOPHERECTOMY;  Surgeon: Jon CINDERELLA Rummer, MD;  Location: [facility];  Service: Gynecology;  Laterality: Bilateral;   UPPER GASTROINTESTINAL ENDOSCOPY  06/13/2014   VAGINAL HYSTERECTOMY  02/22/2011   Procedure: HYSTERECTOMY VAGINAL;  Surgeon: Jon CINDERELLA Rummer, MD;  Location: [facility];  Service: Gynecology;  Laterality: N/A;   WISDOM TOOTH EXTRACTION     Family History  Problem Relation Age of Onset   Other Mother 60       murdered   Diabetes Father    Lung cancer Sister 80   Diabetes Mellitus I Sister    Diabetes Sister    Other Brother 21       murdered   Heart disease Maternal Grandmother    Diabetes Maternal Grandmother    Heart disease Maternal Grandfather    Kidney disease Maternal Grandfather    Diabetes Paternal Grandmother    Brain cancer Paternal Grandfather    Colon cancer Neg Hx    Colon polyps Neg Hx    Esophageal cancer Neg Hx    Gallbladder disease Neg Hx    Breast cancer Neg Hx    Rectal cancer Neg Hx    Stomach cancer Neg Hx    Social History   Socioeconomic History   Marital status: Single    Spouse name: Not on file   Number of children: 4   Years of education: Not on file   Highest education level: Not on file  Occupational History   Occupation: [employer]  Tobacco Use   Smoking status: Never   Smokeless tobacco: Never  Vaping Use   Vaping status: Never Used  Substance and Sexual Activity   Alcohol use: Not Currently    Comment: Rarely   Drug use: No    Sexual activity: Not on file  Other Topics Concern   Not on file  Social History Narrative   Work or School: Programme researcher, broadcasting/film/video, Dealer      Home Situation: lives alone      Spiritual Beliefs: Jahovah witness      Lifestyle: no regular exercise; trying to eat healthy      Social Drivers of Corporate investment banker Strain: Low Risk  (  12/30/2023)   Overall Financial Resource Strain (CARDIA)    Difficulty of Paying Living Expenses: Not hard at all  Food Insecurity: No Food Insecurity (12/30/2023)   Hunger Vital Sign    Worried About Running Out of Food in the Last Year: Never true    Ran Out of Food in the Last Year: Never true  Transportation Needs: No Transportation Needs (12/30/2023)   PRAPARE - Administrator, Civil Service (Medical): No    Lack of Transportation (Non-Medical): No  Physical Activity: Inactive (12/30/2023)   Exercise Vital Sign    Days of Exercise per Week: 0 days    Minutes of Exercise per Session: 0 min  Stress: No Stress Concern Present (12/30/2023)   [employer] of Occupational Health - Occupational Stress Questionnaire    Feeling of Stress: Not at all  Social Connections: Socially Isolated (12/30/2023)   Social Connection and Isolation Panel    Frequency of Communication with Friends and Family: More than three times a week    Frequency of Social Gatherings with Friends and Family: More than three times a week    Attends Religious Services: Never    Database administrator or Organizations: No    Attends Engineer, structural: Never    Marital Status: Separated    Tobacco Counseling Counseling given: Not Answered    Clinical Intake:  Pre-visit preparation completed: Yes  Pain : No/denies pain     BMI - recorded: 31.76 Nutritional Status: BMI > 30  Obese Nutritional Risks: None Diabetes: No  Lab Results  Component Value Date   HGBA1C 5.6 10/26/2022   HGBA1C 5.6 09/08/2017   HGBA1C 5.6 07/19/2017      How often do you need to have someone help you when you read instructions, pamphlets, or other written materials from your doctor or pharmacy?: 1 - Never  Interpreter Needed?: No  Information entered by :: Angel Blush LPN   Activities of Daily Living     12/30/2023    3:09 PM  In your present state of health, do you have any difficulty performing the following activities:  Hearing? 0  Vision? 0  Difficulty concentrating or making decisions? 0  Walking or climbing stairs? 0  Dressing or bathing? 0  Doing errands, shopping? 0  Preparing Food and eating ? N  Using the Toilet? N  In the past six months, have you accidently leaked urine? N  Do you have problems with loss of bowel control? N  Managing your Medications? N  Managing your Finances? N  Housekeeping or managing your Housekeeping? N    Patient Care Team: Ozell Heron HERO, MD as PCP - General (Family Medicine) Nahser, Aleene PARAS, MD (Inactive) as PCP - Cardiology (Cardiology) Kennyth Chew, MD as PCP - Electrophysiology (Cardiology)  I have updated your Care Teams any recent Medical Services you may have received from other providers in the past year.     Assessment:   This is a routine wellness examination for Angel Irwin.  Hearing/Vision screen Hearing Screening - Comments:: Denies hearing difficulties   Vision Screening - Comments:: Wears rx glasses - up to date with routine eye exams with  [facility]   Goals Addressed               This Visit's Progress     Continue physical activity (pt-stated)        Remain active and lose weight.       Depression Screen  12/30/2023    2:55 PM 10/26/2022    9:54 AM 04/22/2022   12:58 PM 10/16/2021    9:35 AM 10/14/2020    1:58 PM 10/14/2020    1:54 PM 06/06/2020    2:59 PM  PHQ 2/9 Scores  PHQ - 2 Score 0 0 0 0 0 0 0  PHQ- 9 Score  5 5        Fall Risk     12/30/2023    3:10 PM 10/26/2022    9:54 AM 10/19/2022    4:19 PM 10/18/2022    4:34 PM  10/16/2021    9:39 AM  Fall Risk   Falls in the past year? 1 0 0 0 0  Number falls in past yr: 0 0 0  0  Injury with Fall? 1 0 0  0  Comment Elbow fx. Followed by medical attention      Risk for fall due to : No Fall Risks No Fall Risks No Fall Risks  No Fall Risks  Follow up Falls evaluation completed Falls evaluation completed Falls evaluation completed      MEDICARE RISK AT HOME:  Medicare Risk at Home Any stairs in or around the home?: Yes If so, are there any without handrails?: No Home free of loose throw rugs in walkways, pet beds, electrical cords, etc?: Yes Adequate lighting in your home to reduce risk of falls?: Yes Life alert?: No Use of a cane, walker or w/c?: No Grab bars in the bathroom?: No Shower chair or bench in shower?: No Elevated toilet seat or a handicapped toilet?: No  TIMED UP AND GO:  Was the test performed?  Yes  Length of time to ambulate 10 feet: 10 sec Gait steady and fast without use of assistive device  Cognitive Function: 6CIT completed    07/29/2017    8:48 AM  MMSE - Mini Mental State Exam  Not completed: --        12/30/2023    3:13 PM 10/16/2021    9:40 AM  6CIT Screen  What Year? 0 points 0 points  What month? 0 points 0 points  What time? 0 points 0 points  Count back from 20 0 points 0 points  Months in reverse 0 points 0 points  Repeat phrase 0 points 0 points  Total Score 0 points 0 points    Immunizations Immunization History  Administered Date(s) Administered   Fluad Quad(high Dose 65+) 06/06/2020   Fluad Trivalent(High Dose 65+) 01/10/2023   INFLUENZA, HIGH DOSE SEASONAL PF 02/19/2016, 03/09/2018   Influenza,inj,Quad PF,6+ Mos 05/04/2013, 04/08/2014, 05/28/2015   PFIZER(Purple Top)SARS-COV-2 Vaccination 05/21/2019, 06/18/2019, 09/11/2020   Pneumococcal Conjugate-13 02/19/2016   Pneumococcal Polysaccharide-23 09/08/2017   Tdap 05/04/2013   Zoster Recombinant(Shingrix ) 06/17/2020, 09/11/2020    Screening  Tests Health Maintenance  Topic Date Due   DTaP/Tdap/Td (2 - Td or Tdap) 05/05/2023   Influenza Vaccine  11/18/2023   COVID-19 Vaccine (4 - 2025-26 season) 12/19/2023   Medicare Annual Wellness (AWV)  12/29/2024   Mammogram  12/11/2025   Colonoscopy  10/22/2026   Pneumococcal Vaccine: 50+ Years  Completed   DEXA SCAN  Completed   Hepatitis C Screening  Completed   Zoster Vaccines- Shingrix   Completed   HPV VACCINES  Aged Out   Meningococcal B Vaccine  Aged Out    Health Maintenance Items Addressed:   Additional Screening:  Vision Screening: Recommended annual ophthalmology exams for early detection of glaucoma and other disorders of the eye. Is  the patient up to date with their annual eye exam?  Yes  Who is the provider or what is the name of the office in which the patient attends annual eye exams? [facility]  Dental Screening: Recommended annual dental exams for proper oral hygiene  Community Resource Referral / Chronic Care Management: CRR required this visit?  No   CCM required this visit?  No   Plan:    I have personally reviewed and noted the following in the patient's chart:   Medical and social history Use of alcohol, tobacco or illicit drugs  Current medications and supplements including opioid prescriptions. Patient is not currently taking opioid prescriptions. Functional ability and status Nutritional status Physical activity Advanced directives List of other physicians Hospitalizations, surgeries, and ER visits in previous 12 months Vitals Screenings to include cognitive, depression, and falls Referrals and appointments  In addition, I have reviewed and discussed with patient certain preventive protocols, quality metrics, and best practice recommendations. A written personalized care plan for preventive services as well as general preventive health recommendations were provided to patient.   Angel LELON Blush, LPN   [id number]   After Visit  Summary: (In Person-Printed) AVS printed and given to the patient  Notes: Nothing significant to report at this time.

## 2023-12-30 NOTE — Patient Instructions (Addendum)
 [email or web address]  [email or web address]  [email or web address]  [facility] medical  Tetanus booster is due

## 2023-12-30 NOTE — Patient Instructions (Addendum)
 Angel Irwin,  Thank you for taking the time for your Medicare Wellness Visit. I appreciate your continued commitment to your health goals. Please review the care plan we discussed, and feel free to reach out if I can assist you further.  Medicare recommends these wellness visits once per year to help you and your care team stay ahead of potential health issues. These visits are designed to focus on prevention, allowing your provider to concentrate on managing your acute and chronic conditions during your regular appointments.  Please note that Annual Wellness Visits do not include a physical exam. Some assessments may be limited, especially if the visit was conducted virtually. If needed, we may recommend a separate in-person follow-up with your provider.  Ongoing Care Seeing your primary care provider every 3 to 6 months helps us  monitor your health and provide consistent, personalized care.   Referrals If a referral was made during today's visit and you haven't received any updates within two weeks, please contact the referred provider directly to check on the status.  Recommended Screenings:  Health Maintenance  Topic Date Due   DTaP/Tdap/Td vaccine (2 - Td or Tdap) 05/05/2023   Flu Shot  11/18/2023   COVID-19 Vaccine (4 - 2025-26 season) 12/19/2023   Medicare Annual Wellness Visit  12/29/2024   Breast Cancer Screening  12/11/2025   Colon Cancer Screening  10/22/2026   Pneumococcal Vaccine for age over 73  Completed   DEXA scan (bone density measurement)  Completed   Hepatitis C Screening  Completed   Zoster (Shingles) Vaccine  Completed   HPV Vaccine  Aged Out   Meningitis B Vaccine  Aged Out       12/30/2023    3:13 PM  Advanced Directives  Does Patient Have a Medical Advance Directive? No  Would patient like information on creating a medical advance directive? No - Patient declined   Advance Care Planning is important because it: Ensures you receive medical care that  aligns with your values, goals, and preferences. Provides guidance to your family and loved ones, reducing the emotional burden of decision-making during critical moments.  Vision: Annual vision screenings are recommended for early detection of glaucoma, cataracts, and diabetic retinopathy. These exams can also reveal signs of chronic conditions such as diabetes and high blood pressure.  Dental: Annual dental screenings help detect early signs of oral cancer, gum disease, and other conditions linked to overall health, including heart disease and diabetes.  Please see the attached documents for additional preventive care recommendations.

## 2023-12-30 NOTE — Progress Notes (Signed)
 Complete physical exam  Patient: Angel Irwin   DOB: 22-Oct-1950   73 y.o. Female  MRN: [id number]  Subjective:    No chief complaint on file.   Angel Irwin is a 73 y.o. female who presents today for a complete physical exam. She reports consuming a general diet. For exercise she takes care of her great grandson and she goes to craft shows, stays active daily. She generally feels well. She reports sleeping fairly well. She does not have additional problems to discuss today.    Most recent fall risk assessment:    12/30/2023    3:10 PM  Fall Risk   Falls in the past year? 1  Number falls in past yr: 0  Injury with Fall? 1  Comment Elbow fx. Followed by medical attention  Risk for fall due to : No Fall Risks  Follow up Falls evaluation completed     Most recent depression screenings:    12/30/2023    2:55 PM 10/26/2022    9:54 AM  PHQ 2/9 Scores  PHQ - 2 Score 0 0  PHQ- 9 Score  5    Vision:Within last year and Dental: No current dental problems and Receives regular dental care  Patient Active Problem List   Diagnosis Date Noted   Radial head fracture 03/29/2023   Fracture of radial head, left, closed 03/29/2023   Premature ventricular contractions 01/07/2023   Osteoporosis 09/13/2018   Hemangioma of liver 03/09/2018   Vitamin D  deficiency 08/26/2016   Fall 08/26/2016   Hiatal hernia 09/04/2014   Cameron lesion, 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    albuterol  (VENTOLIN  HFA) 108 (90 Base) MCG/ACT inhaler Inhale 2 puffs into the lungs every 6 (six) hours as needed for wheezing or shortness of breath.   CALCIUM PO Take by mouth.   flecainide  (TAMBOCOR ) 50 MG tablet Take 1 tablet (50 mg total) by mouth 2 (two) times daily.   ibuprofen  (ADVIL ) 800 MG tablet Take 1 tablet (800 mg total) by mouth every 8 (eight) hours as needed. Take with food   Magnesium 250 MG TABS Take 250 mg by mouth daily in the afternoon.   metoprolol  succinate (TOPROL -XL) 25 MG 24 hr tablet Take 12.5 mg by mouth daily.   VITAMIN D  PO Take 6,000 Units by mouth daily. With K2 100 mcg   [DISCONTINUED] alendronate  (FOSAMAX ) 70 MG tablet Take 1 tablet (70 mg total) by mouth every 7 (seven) days. Take with a full glass of water on an empty stomach.   No facility-administered medications prior to visit.    Review of Systems  HENT:  Negative for hearing loss.   Eyes:  Negative for blurred vision.  Respiratory:  Negative for shortness of breath.   Cardiovascular:  Negative for chest pain.  Gastrointestinal: Negative.   Genitourinary: Negative.   Musculoskeletal:  Negative for back pain.  Neurological:  Negative for headaches.  Psychiatric/Behavioral:  Negative for depression.        Objective:  BP 120/60 Comment: vitals performed by Beverly--jaf  Pulse 60   Temp 98 F (36.7 C) (Oral)   Ht 5' 1 (1.549 m)   Wt 168 lb 1.6 oz (76.2 kg)   SpO2 98%   BMI 31.76 kg/m    Physical Exam Vitals reviewed.  Constitutional:      Appearance: Normal appearance. She is well-groomed. She is obese.  HENT:     Right Ear: Tympanic membrane and ear canal normal.     Left Ear: Tympanic membrane and ear canal normal.     Mouth/Throat:     Mouth: Mucous membranes are moist.     Pharynx: No posterior oropharyngeal erythema.  Eyes:     Conjunctiva/sclera: Conjunctivae normal.  Neck:     Thyroid : No thyromegaly.  Cardiovascular:     Rate and Rhythm: Normal rate and regular  rhythm.     Pulses: Normal pulses.     Heart sounds: S1 normal and S2 normal.  Pulmonary:     Effort: Pulmonary effort is normal.     Breath sounds: Normal breath sounds and air entry.  Abdominal:     General: Abdomen is flat. Bowel sounds are normal.     Palpations: Abdomen is soft.  Musculoskeletal:     Right lower leg: No edema.     Left lower leg: No edema.  Lymphadenopathy:     Cervical: No cervical adenopathy.  Neurological:     Mental Status: She is alert and oriented to person, place, and time. Mental status is at baseline.     Gait: Gait is intact.  Psychiatric:        Mood and Affect: Mood and affect normal.        Speech: Speech normal.        Behavior: Behavior normal.        Judgment: Judgment normal.      No results found for any visits on 12/30/23.     Assessment & Plan:    Routine Health Maintenance and Physical Exam  Immunization History  Administered Date(s) Administered   Fluad Quad(high Dose 65+) 06/06/2020   Fluad Trivalent(High Dose 65+) 01/10/2023   INFLUENZA, HIGH DOSE SEASONAL PF 02/19/2016, 03/09/2018, 12/30/2023   Influenza,inj,Quad PF,6+ Mos 05/04/2013, 04/08/2014, 05/28/2015   PFIZER(Purple Top)SARS-COV-2 Vaccination 05/21/2019, 06/18/2019, 09/11/2020   Pneumococcal Conjugate-13 02/19/2016   Pneumococcal Polysaccharide-23 09/08/2017   Tdap 05/04/2013   Zoster Recombinant(Shingrix ) 06/17/2020, 09/11/2020    Health Maintenance  Topic Date Due   DTaP/Tdap/Td (2 - Td or Tdap) 05/05/2023   COVID-19 Vaccine (4 - 2025-26 season) 12/19/2023   Medicare Annual Wellness (AWV)  12/29/2024   Mammogram  12/11/2025   Colonoscopy  10/22/2026   Pneumococcal Vaccine: 50+ Years  Completed   Influenza Vaccine  Completed   DEXA SCAN  Completed   Hepatitis C Screening  Completed   Zoster Vaccines- Shingrix   Completed   HPV VACCINES  Aged Out   Meningococcal B Vaccine  Aged Out    Discussed health benefits of physical activity, and encouraged her to  engage in regular exercise appropriate for her age and condition.  Immunization due -     Flu vaccine HIGH DOSE PF(Fluzone Trivalent)  Hyperglycemia -     Hemoglobin A1c; Future  Lipid screening -     Lipid panel; Future  Vitamin D  deficiency -     VITAMIN D  25 Hydroxy (Vit-D Deficiency, Fractures); Future  Routine adult health maintenance -     Comprehensive metabolic panel with GFR; Future  General physical exam findings are normal today. I reviewed the patient's preventative testing, immunizations, and lifestyle habits. I made appropriate recommendations and placed orders for the appropriate tests and/or vaccinations. I counseled the patient on the CDC's recommendations for healthy exercise and diet. I counseled the patient on healthy sleep habits and stress management. Handouts to reinforce the counseling were given at the conclusion of the visit.   No follow-ups on file.     Heron CHRISTELLA Sharper, MD

## 2024-01-02 ENCOUNTER — Telehealth: Payer: Self-pay

## 2024-01-02 NOTE — Telephone Encounter (Signed)
 Evenity VOB initiated via [email or web address]  Last Evenity inj:  Next Evenity inj DUE:  [employer]

## 2024-01-03 ENCOUNTER — Encounter: Payer: Self-pay | Admitting: Internal Medicine

## 2024-01-03 ENCOUNTER — Other Ambulatory Visit (HOSPITAL_COMMUNITY): Payer: Self-pay

## 2024-01-03 NOTE — Telephone Encounter (Signed)
 Pt ready for scheduling for EVENITY  on or after : 01/03/24  Option# 1 Buy/Bill (Office supplied medication)  Out-of-pocket cost due at time of  office visit: $507.41  Number of injection/visits approved: 12  Primary: [employer] Evenity  co-insurance: 20% Admin fee co-insurance: 20%  Secondary: --- Evenity  co-insurance:  Admin fee co-insurance:   Medical Benefit Details: Date Benefits were checked: 01/02/24 Deductible: NO/ Coinsurance: 20%/ Admin Fee: 20%  Prior Auth: APPROVED PA#  [id number]  Expiration Date: 01/03/24-01/02/25  # of doses approved: 12 ------------------------------------------------------------------------- Option# 2- Med Obtained from pharmacy  Pharmacy benefit: Copay $--- (Paid to pharmacy) Admin Fee: --- (Pay at clinic)  Prior Auth: --- PA# Expiration Date:   # of doses approved:  If patient wants fill through the pharmacy benefit please send prescription to: ---, and include estimated need by date in rx notes. Pharmacy will ship medication directly to the office.  Patient NOT eligible for Evenity  Copay Card. Copay Card can make patient's cost as little as $25. Link to apply: [email or web address]://www.[email or web address]/copay   This summary of benefits is an estimation of the patient's out-of-pocket cost. Exact cost may very based on individual plan coverage.

## 2024-01-03 NOTE — Telephone Encounter (Signed)
 PA for buy and bill submitted via Availity. Request #: [id number]   PA for pharmacy benefit started via Latent. Per PA, Evenity  is only covered through pharmacy if patient has tried and failed Teriparatide. Patient does not meet requirements.

## 2024-01-03 NOTE — Telephone Encounter (Signed)
 Angel Irwin

## 2024-01-05 ENCOUNTER — Other Ambulatory Visit (INDEPENDENT_AMBULATORY_CARE_PROVIDER_SITE_OTHER)

## 2024-01-05 DIAGNOSIS — Z0000 Encounter for general adult medical examination without abnormal findings: Secondary | ICD-10-CM

## 2024-01-05 DIAGNOSIS — Z13220 Encounter for screening for lipoid disorders: Secondary | ICD-10-CM | POA: Diagnosis not present

## 2024-01-05 DIAGNOSIS — E559 Vitamin D deficiency, unspecified: Secondary | ICD-10-CM | POA: Diagnosis not present

## 2024-01-05 DIAGNOSIS — R739 Hyperglycemia, unspecified: Secondary | ICD-10-CM

## 2024-01-05 LAB — COMPREHENSIVE METABOLIC PANEL WITH GFR
ALT: 19 U/L (ref 0–35)
AST: 22 U/L (ref 0–37)
Albumin: 4.1 g/dL (ref 3.5–5.2)
Alkaline Phosphatase: 61 U/L (ref 39–117)
BUN: 16 mg/dL (ref 6–23)
CO2: 28 meq/L (ref 19–32)
Calcium: 10.1 mg/dL (ref 8.4–10.5)
Chloride: 104 meq/L (ref 96–112)
Creatinine, Ser: 0.88 mg/dL (ref 0.40–1.20)
GFR: 65.39 mL/min (ref >60.00)
Glucose, Bld: 91 mg/dL (ref 70–99)
Potassium: 4.3 meq/L (ref 3.5–5.1)
Sodium: 137 meq/L (ref 135–145)
Total Bilirubin: 0.6 mg/dL (ref 0.2–1.2)
Total Protein: 7 g/dL (ref 6.0–8.3)

## 2024-01-05 LAB — VITAMIN D 25 HYDROXY (VIT D DEFICIENCY, FRACTURES): VITD: 68.3 ng/mL (ref 30.00–100.00)

## 2024-01-05 LAB — LIPID PANEL
Cholesterol: 183 mg/dL (ref 0–200)
HDL: 38.6 mg/dL — ABNORMAL LOW (ref >39.00)
LDL Cholesterol: 119 mg/dL — ABNORMAL HIGH (ref 0–99)
NonHDL: 144.82
Total CHOL/HDL Ratio: 5
Triglycerides: 127 mg/dL (ref 0.0–149.0)
VLDL: 25.4 mg/dL (ref 0.0–40.0)

## 2024-01-05 LAB — HEMOGLOBIN A1C: Hgb A1c MFr Bld: 5.9 % (ref 4.6–6.5)

## 2024-01-10 ENCOUNTER — Telehealth: Payer: Self-pay

## 2024-01-10 ENCOUNTER — Other Ambulatory Visit: Payer: Self-pay

## 2024-01-10 DIAGNOSIS — M810 Age-related osteoporosis without current pathological fracture: Secondary | ICD-10-CM

## 2024-01-10 MED ORDER — DENOSUMAB 60 MG/ML ~~LOC~~ SOSY
60.0000 mg | PREFILLED_SYRINGE | Freq: Once | SUBCUTANEOUS | Status: AC
  Administered 2024-02-01: 60 mg via SUBCUTANEOUS

## 2024-01-10 NOTE — Telephone Encounter (Signed)
 Prolia  VOB initiated via [email or web address]  Next Prolia  inj DUE: NEW START

## 2024-01-10 NOTE — Progress Notes (Signed)
 Per Dr. Ozell order canceled.

## 2024-01-10 NOTE — Addendum Note (Signed)
 Addended by: DIONISIO CAMELIA PARAS on: 01/10/2024 08:48 AM   Modules accepted: Orders

## 2024-01-11 ENCOUNTER — Telehealth: Payer: Self-pay | Admitting: Cardiology

## 2024-01-11 ENCOUNTER — Other Ambulatory Visit (HOSPITAL_COMMUNITY): Payer: Self-pay

## 2024-01-11 MED ORDER — METOPROLOL SUCCINATE ER 25 MG PO TB24: 12.5000 mg | ORAL_TABLET | Freq: Every day | ORAL | 0 refills | Status: DC

## 2024-01-11 MED ORDER — FLECAINIDE ACETATE 50 MG PO TABS: 50.0000 mg | ORAL_TABLET | Freq: Two times a day (BID) | ORAL | 0 refills | Status: DC

## 2024-01-11 NOTE — Telephone Encounter (Signed)
 Angel Irwin

## 2024-01-11 NOTE — Telephone Encounter (Signed)
 Pt's medications were sent to pt's pharmacy as requested. Confirmation received.

## 2024-01-11 NOTE — Telephone Encounter (Signed)
*  STAT* If patient is at the pharmacy, call can be transferred to refill team.   1. Which medications need to be refilled? (please list name of each medication and dose if known)  flecainide  (TAMBOCOR ) 50 MG tablet  metoprolol  succinate (TOPROL -XL) 25 MG 24 hr tablet   2. Which pharmacy/location (including street and city if local pharmacy) is medication to be sent to? [id number] - [place] ([place]), [place] - [address]   3. Do they need a 30 day or 90 day supply? 90 day

## 2024-01-11 NOTE — Telephone Encounter (Signed)
 Pt ready for scheduling for PROLIA  on or after : 01/12/24  Option# 1: Buy/Bill (Office supplied medication)  Out-of-pocket cost due at time of clinic visit: $332  Number of injection/visits approved: 2  Primary: [employer] Prolia  co-insurance: 20% Admin fee co-insurance: 0%  Secondary: --- Prolia  co-insurance:  Admin fee co-insurance:   Medical Benefit Details: Date Benefits were checked: 01/11/24 Deductible: NO/ Coinsurance: 20%/ Admin Fee: 0%  Prior Auth: APPROVED PA# [id number] Expiration Date: 01/12/24-01/11/25  # of doses approved: 2 ----------------------------------------------------------------------- Option# 2- Med Obtained from pharmacy:  Pharmacy benefit: Copay $703.01 (Paid to pharmacy) Admin Fee: 0% (Pay at clinic)  Prior Auth: N/A PA# Expiration Date:   # of doses approved:   If patient wants fill through the pharmacy benefit please send prescription to: [facility], and include estimated need by date in rx notes. Pharmacy will ship medication directly to the office.  Patient NOT eligible for Prolia  Copay Card. Copay Card can make patient's cost as little as $25. Link to apply: [email or web address]://www.[email or web address]  ** This summary of benefits is an estimation of the patient's out-of-pocket cost. Exact cost may very based on individual plan coverage.

## 2024-01-12 ENCOUNTER — Ambulatory Visit: Admitting: Pulmonary Disease

## 2024-01-13 ENCOUNTER — Ambulatory Visit: Payer: Self-pay | Admitting: Family Medicine

## 2024-01-13 NOTE — Progress Notes (Signed)
  Electrophysiology Office Note:   Date:  01/13/2024  ID:  Angel Irwin, DOB 1951/02/02, MRN [id number]  Primary Cardiologist: Aleene Passe, MD (Inactive) Primary Heart Failure: None Electrophysiologist: Fonda Kitty, MD  {Click to update primary MD,subspecialty MD or APP then REFRESH:1}    History of Present Illness:   Angel Irwin is a 72 y.o. female with h/o bradycardia, PVC's seen today for routine electrophysiology followup.   Since last being seen in our clinic the patient reports doing ***.    She *** denies chest pain, palpitations, dyspnea, PND, orthopnea, nausea, vomiting, dizziness, syncope, edema, weight gain, or early satiety.   Review of systems complete and found to be negative unless listed in HPI.   EP Information / Studies Reviewed:    EKG is ordered today. Personal review as below.      Arrhythmia / AAD / Pertinent EP Studies PVC's   Cardiac Monitor 05/2022 > 26.5% PVC's  Flecainide  02/01/23 >  Cardiac Monitor 03/2023 > 3.8% PVC's   Risk Assessment/Calculations:     No BP recorded.  {Refresh Note OR Click here to enter BP  :1}***         Physical Exam:   VS:  There were no vitals taken for this visit.   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 Kitty {EPFOLLOW LE:[id number]}  Signed, Daphne Barrack, NP-C, AGACNP-BC [facility] - Electrophysiology  01/23/2024, 8:44 AM

## 2024-01-17 NOTE — Telephone Encounter (Signed)
 Please see patient's out of pocket for Prolia  $332. Please advise if another alternative is recommended.

## 2024-01-18 NOTE — Telephone Encounter (Signed)
 Left voicemail for patient to return my call.

## 2024-01-18 NOTE — Telephone Encounter (Signed)
 Can we ask the patient if she can afford this? Her T score was -3.0 which is severe

## 2024-01-20 NOTE — Telephone Encounter (Signed)
 Attempted to reach patient. Left voicemail for patient to return my call. Sent message via MyChart as well.

## 2024-01-24 ENCOUNTER — Ambulatory Visit: Attending: Pulmonary Disease | Admitting: Pulmonary Disease

## 2024-01-24 ENCOUNTER — Encounter: Payer: Self-pay | Admitting: Pulmonary Disease

## 2024-01-24 VITALS — BP 136/62 | HR 43 | Ht 60.0 in | Wt 169.7 lb

## 2024-01-24 DIAGNOSIS — Z79899 Other long term (current) drug therapy: Secondary | ICD-10-CM | POA: Diagnosis not present

## 2024-01-24 DIAGNOSIS — I493 Ventricular premature depolarization: Secondary | ICD-10-CM | POA: Diagnosis not present

## 2024-01-24 DIAGNOSIS — R001 Bradycardia, unspecified: Secondary | ICD-10-CM | POA: Diagnosis not present

## 2024-01-24 NOTE — Patient Instructions (Signed)
 Medication Instructions:  No medications changes today   *If you need a refill on your cardiac medications before your next appointment, please call your pharmacy*  Lab Work: No lab work today If you have labs (blood work) drawn today and your tests are completely normal, you will receive your results only by: MyChart Message (if you have MyChart) OR A paper copy in the mail If you have any lab test that is abnormal or we need to change your treatment, we will call you to review the results.  Testing/Procedures: No testing/procedures were scheduled today  Follow-Up: At [facility], you and your health needs are our priority.  As part of our continuing mission to provide you with exceptional heart care, our providers are all part of one team.  This team includes your primary Cardiologist (physician) and Advanced Practice Providers or APPs (Physician Assistants and Nurse Practitioners) who all work together to provide you with the care you need, when you need it.  Your next appointment:   4 month(s)  Provider:   You may see Fonda Kitty, MD or one of the following Advanced Practice Providers on your designated Care Team:    Daphne Barrack, NP    We recommend signing up for the patient portal called MyChart.  Sign up information is provided on this After Visit Summary.  MyChart is used to connect with patients for Virtual Visits (Telemedicine).  Patients are able to view lab/test results, encounter notes, upcoming appointments, etc.  Non-urgent messages can be sent to your provider as well.   To learn more about what you can do with MyChart, go to [email or web address].

## 2024-01-26 ENCOUNTER — Telehealth: Payer: Self-pay | Admitting: *Deleted

## 2024-01-26 NOTE — Telephone Encounter (Signed)
 Copied from CRM [id number]. Topic: Appointments - Appointment Scheduling >> Jan 26, 2024  4:12 PM Viola FALCON wrote: Patient returned [facility] phone call, scheduled prolia  injection for 02/01/24

## 2024-02-01 ENCOUNTER — Ambulatory Visit: Admitting: *Deleted

## 2024-02-01 DIAGNOSIS — M810 Age-related osteoporosis without current pathological fracture: Secondary | ICD-10-CM | POA: Diagnosis not present

## 2024-02-01 MED ORDER — DENOSUMAB 60 MG/ML ~~LOC~~ SOSY: 60.0000 mg | PREFILLED_SYRINGE | SUBCUTANEOUS | Status: AC

## 2024-02-01 NOTE — Progress Notes (Signed)
Per orders of Dr. Casimiro Needle, injection of Prolia 60mg  given by Johnella Moloney. Patient tolerated injection well.

## 2024-02-20 ENCOUNTER — Encounter: Payer: Self-pay | Admitting: Radiology

## 2024-04-24 ENCOUNTER — Telehealth: Payer: Self-pay | Admitting: *Deleted

## 2024-04-24 NOTE — Telephone Encounter (Signed)
 Copied from CRM #[id number]. Topic: Appointments - Scheduling Inquiry for Clinic >> Apr 24, 2024 11:59 AM Winona R wrote: Pt would like to schedule an appointment for prolia  injection for April 13th

## 2024-04-25 ENCOUNTER — Other Ambulatory Visit: Payer: Self-pay | Admitting: Cardiology

## 2024-04-25 MED ORDER — DENOSUMAB 60 MG/ML ~~LOC~~ SOSY: 60.0000 mg | PREFILLED_SYRINGE | SUBCUTANEOUS | Status: AC

## 2024-04-25 NOTE — Addendum Note (Signed)
 Addended by: DIONISIO COLLIE PARAS on: 04/25/2024 01:10 PM   Modules accepted: Orders

## 2024-04-25 NOTE — Telephone Encounter (Signed)
 Patient will be contacted once PA has been received.

## 2024-05-24 NOTE — Progress Notes (Unsigned)
" °  Electrophysiology Office Note:   Date:  05/25/2024  ID:  Kumiko Fishman, DOB [birth year], MRN [id number]  Primary Cardiologist: Aleene Passe, MD (Inactive) Primary Heart Failure: None Electrophysiologist: Fonda Kitty, MD      History of Present Illness:   Angel Irwin is a 74 y.o. female with h/o bradycardia, PVCs seen today for routine electrophysiology followup.   Last seen in 01/2024 and was doing well. Was working at [employer] with her friend.   Since last being seen in our clinic the patient reports doing well. She continues to work with her friend at [employer].  HR by her Fit Bit ranges from 44-86 bpm. She has no difficulty with activity. Denies shortness of breath, chest pain.  She denies chest pain, palpitations, dyspnea, PND, orthopnea, nausea, vomiting, dizziness, syncope, edema, weight gain, or early satiety.   Review of systems complete and found to be negative unless listed in HPI.   EP Information / Studies Reviewed:    EKG is ordered today. Personal review as below.  EKG Interpretation Date/Time:  Friday May 25 2024 08:10:33 EST Ventricular Rate:  44 PR Interval:  120 QRS Duration:  76 QT Interval:  464 QTC Calculation: 396 R Axis:   50  Text Interpretation: Marked sinus bradycardia Nonspecific T wave abnormality Confirmed by Aniceto Jarvis ([id number]) on 05/25/2024 8:15:35 AM   Arrhythmia / AAD / Pertinent EP Studies PVC's   Cardiac Monitor 05/2022 > 26.5% PVC's  Flecainide  02/01/23 >  Cardiac Monitor 03/2023 > 3.8% PVC's  Physical Exam:   VS:  BP (!) 140/78   Pulse (!) 44   Ht 5' (1.524 m)   Wt 168 lb (76.2 kg)   SpO2 99%   BMI 32.81 kg/m    Wt Readings from Last 3 Encounters:  05/25/24 168 lb (76.2 kg)  01/24/24 169 lb 11.2 oz (77 kg)  12/30/23 168 lb 1.6 oz (76.2 kg)     GEN: Well nourished, well developed in no acute distress NECK: No JVD; No carotid bruits CARDIAC: Regular rate and rhythm, no murmurs, rubs, gallops RESPIRATORY:   Clear to auscultation without rales, wheezing or rhonchi  ABDOMEN: Soft, non-tender, non-distended EXTREMITIES:  No edema; No deformity   Risk Assessment/Calculations:       ASSESSMENT AND PLAN:    PVCs Sinus Bradycardia High Risk Medication Monitoring: Flecainide   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 Discussed red flag symptoms of conduction disease.  -historically not interested in catheter ablation   Follow up with EP APP in 6 months  Signed, Jarvis Aniceto, NP-C, AGACNP-BC [facility] - Electrophysiology  05/25/2024, 11:10 AM  "

## 2024-05-25 ENCOUNTER — Ambulatory Visit: Admitting: Pulmonary Disease

## 2024-05-25 ENCOUNTER — Encounter: Payer: Self-pay | Admitting: Pulmonary Disease

## 2024-05-25 VITALS — BP 140/78 | HR 44 | Ht 60.0 in | Wt 168.0 lb

## 2024-05-25 DIAGNOSIS — Z79899 Other long term (current) drug therapy: Secondary | ICD-10-CM

## 2024-05-25 DIAGNOSIS — R001 Bradycardia, unspecified: Secondary | ICD-10-CM

## 2024-05-25 DIAGNOSIS — R002 Palpitations: Secondary | ICD-10-CM

## 2024-05-25 DIAGNOSIS — I493 Ventricular premature depolarization: Secondary | ICD-10-CM

## 2024-05-25 NOTE — Patient Instructions (Signed)
 Medication Instructions:  Your physician recommends that you continue on your current medications as directed. Please refer to the Current Medication list given to you today.  *If you need a refill on your cardiac medications before your next appointment, please call your pharmacy*  Lab Work: None ordered If you have labs (blood work) drawn today and your tests are completely normal, you will receive your results only by: MyChart Message (if you have MyChart) OR A paper copy in the mail If you have any lab test that is abnormal or we need to change your treatment, we will call you to review the results.  Follow-Up: At [facility], you and your health needs are our priority.  As part of our continuing mission to provide you with exceptional heart care, our providers are all part of one team.  This team includes your primary Cardiologist (physician) and Advanced Practice Providers or APPs (Physician Assistants and Nurse Practitioners) who all work together to provide you with the care you need, when you need it.  Your next appointment:   6 month(s)  Provider:   Fonda Kitty, MD or Daphne Barrack, NP

## 2025-01-04 ENCOUNTER — Ambulatory Visit
